# Patient Record
Sex: Female | Born: 1950 | Race: White | Hispanic: No | Marital: Married | Smoking: Never smoker
Health system: Southern US, Community
[De-identification: ages and names within clinical notes are randomized; demographics above are authoritative.]

## PROBLEM LIST (undated history)

## (undated) DIAGNOSIS — R232 Flushing: Secondary | ICD-10-CM

## (undated) DIAGNOSIS — M1990 Unspecified osteoarthritis, unspecified site: Secondary | ICD-10-CM

## (undated) DIAGNOSIS — I480 Paroxysmal atrial fibrillation: Secondary | ICD-10-CM

## (undated) DIAGNOSIS — I4510 Unspecified right bundle-branch block: Secondary | ICD-10-CM

## (undated) DIAGNOSIS — Z8669 Personal history of other diseases of the nervous system and sense organs: Secondary | ICD-10-CM

## (undated) DIAGNOSIS — E785 Hyperlipidemia, unspecified: Secondary | ICD-10-CM

## (undated) DIAGNOSIS — IMO0001 Reserved for inherently not codable concepts without codable children: Secondary | ICD-10-CM

## (undated) DIAGNOSIS — R0600 Dyspnea, unspecified: Secondary | ICD-10-CM

## (undated) DIAGNOSIS — E669 Obesity, unspecified: Secondary | ICD-10-CM

## (undated) DIAGNOSIS — I319 Disease of pericardium, unspecified: Secondary | ICD-10-CM

## (undated) DIAGNOSIS — I10 Essential (primary) hypertension: Secondary | ICD-10-CM

## (undated) DIAGNOSIS — E039 Hypothyroidism, unspecified: Secondary | ICD-10-CM

## (undated) DIAGNOSIS — R011 Cardiac murmur, unspecified: Secondary | ICD-10-CM

## (undated) HISTORY — PX: HAND SURGERY: SHX662

## (undated) HISTORY — DX: Hypothyroidism, unspecified: E03.9

## (undated) HISTORY — DX: Essential (primary) hypertension: I10

## (undated) HISTORY — DX: Unspecified osteoarthritis, unspecified site: M19.90

## (undated) HISTORY — DX: Personal history of other diseases of the nervous system and sense organs: Z86.69

## (undated) HISTORY — PX: DILATION AND CURETTAGE OF UTERUS: SHX78

## (undated) HISTORY — DX: Hyperlipidemia, unspecified: E78.5

## (undated) HISTORY — PX: CARPAL TUNNEL RELEASE: SHX101

## (undated) HISTORY — DX: Flushing: R23.2

## (undated) HISTORY — DX: Reserved for inherently not codable concepts without codable children: IMO0001

## (undated) HISTORY — DX: Obesity, unspecified: E66.9

---

## 1973-05-27 HISTORY — PX: TUBAL LIGATION: SHX77

## 1985-05-27 HISTORY — PX: CHOLECYSTECTOMY: SHX55

## 1998-04-12 ENCOUNTER — Other Ambulatory Visit: Admission: RE | Admit: 1998-04-12 | Discharge: 1998-04-12 | Payer: Self-pay | Admitting: Obstetrics and Gynecology

## 1999-01-18 ENCOUNTER — Other Ambulatory Visit: Admission: RE | Admit: 1999-01-18 | Discharge: 1999-01-18 | Payer: Self-pay | Admitting: Obstetrics and Gynecology

## 2000-03-19 ENCOUNTER — Other Ambulatory Visit: Admission: RE | Admit: 2000-03-19 | Discharge: 2000-03-19 | Payer: Self-pay | Admitting: Obstetrics and Gynecology

## 2001-07-10 ENCOUNTER — Other Ambulatory Visit: Admission: RE | Admit: 2001-07-10 | Discharge: 2001-07-10 | Payer: Self-pay | Admitting: Obstetrics and Gynecology

## 2002-05-27 DIAGNOSIS — IMO0001 Reserved for inherently not codable concepts without codable children: Secondary | ICD-10-CM

## 2002-05-27 HISTORY — DX: Reserved for inherently not codable concepts without codable children: IMO0001

## 2002-08-20 HISTORY — PX: CARDIOVASCULAR STRESS TEST: SHX262

## 2003-01-03 ENCOUNTER — Other Ambulatory Visit: Admission: RE | Admit: 2003-01-03 | Discharge: 2003-01-03 | Payer: Self-pay | Admitting: Obstetrics and Gynecology

## 2004-07-25 HISTORY — PX: US ECHOCARDIOGRAPHY: HXRAD669

## 2004-10-24 ENCOUNTER — Other Ambulatory Visit: Admission: RE | Admit: 2004-10-24 | Discharge: 2004-10-24 | Payer: Self-pay | Admitting: Obstetrics and Gynecology

## 2008-03-23 ENCOUNTER — Emergency Department (HOSPITAL_COMMUNITY): Admission: EM | Admit: 2008-03-23 | Discharge: 2008-03-23 | Payer: Self-pay | Admitting: Emergency Medicine

## 2008-07-13 ENCOUNTER — Encounter: Admission: RE | Admit: 2008-07-13 | Discharge: 2008-07-13 | Payer: Self-pay | Admitting: Family Medicine

## 2008-07-23 ENCOUNTER — Encounter: Admission: RE | Admit: 2008-07-23 | Discharge: 2008-07-23 | Payer: Self-pay | Admitting: Neurosurgery

## 2008-08-09 ENCOUNTER — Ambulatory Visit (HOSPITAL_COMMUNITY): Admission: RE | Admit: 2008-08-09 | Discharge: 2008-08-10 | Payer: Self-pay | Admitting: Neurosurgery

## 2008-08-25 HISTORY — PX: SPINE SURGERY: SHX786

## 2009-10-06 IMAGING — RF DG THORACIC SPINE 2V
1 series · 3 of 3 positions shown · non-contrast
Comparison: None

CLINICAL DATA: Thoracic disc protrusion.

THORACIC SPINE - 2 VIEW

[Series 1: run · 3 of 3 slices shown]
[im 1/3]
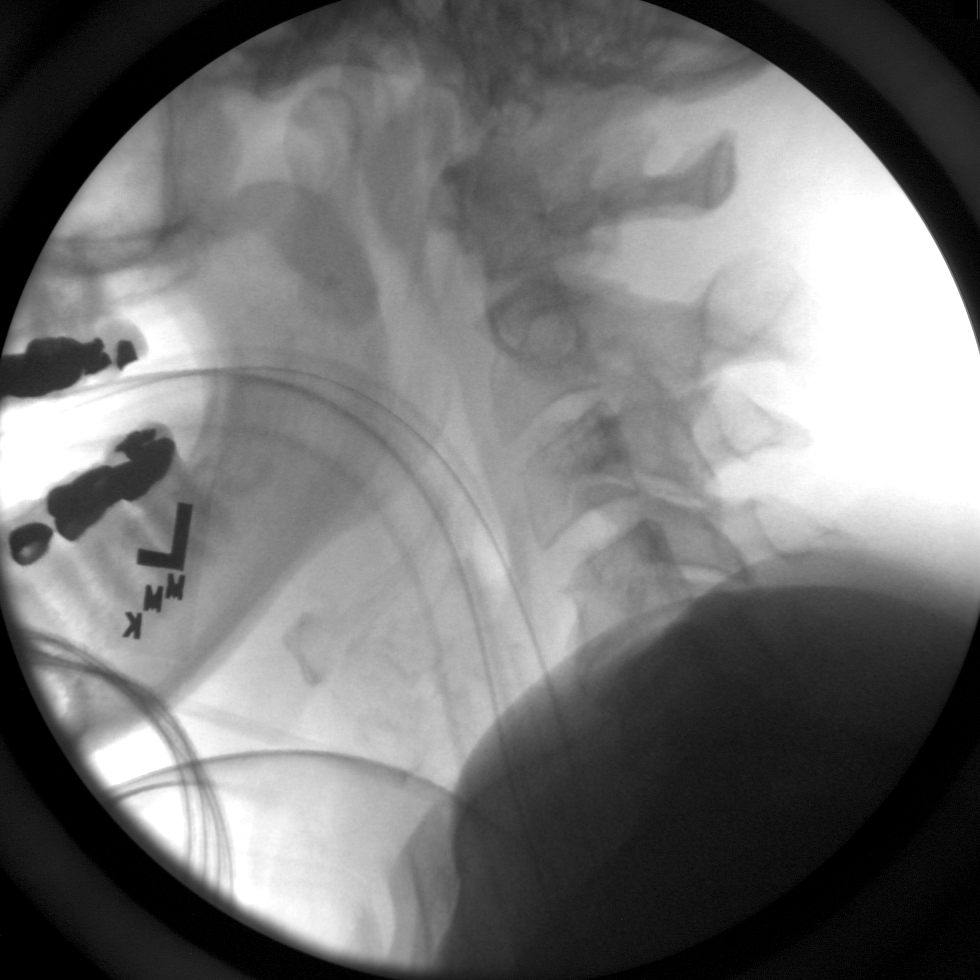
[im 2/3]
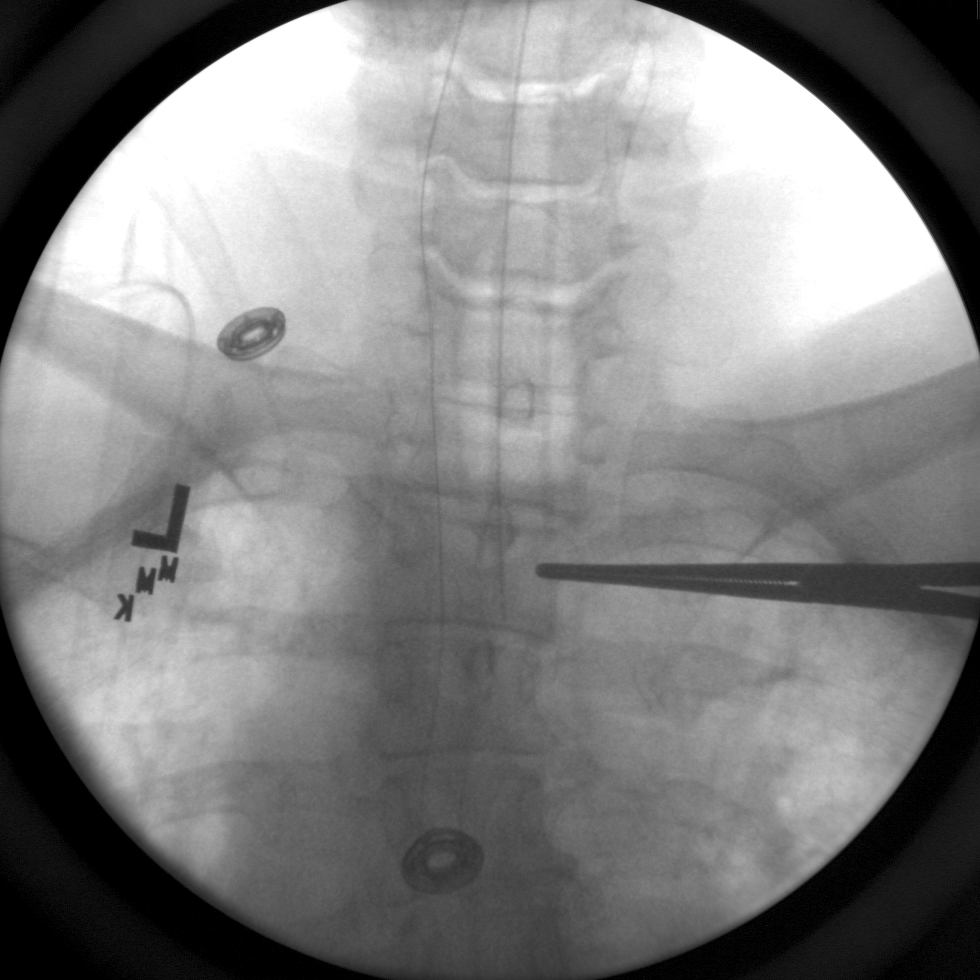
[im 3/3]
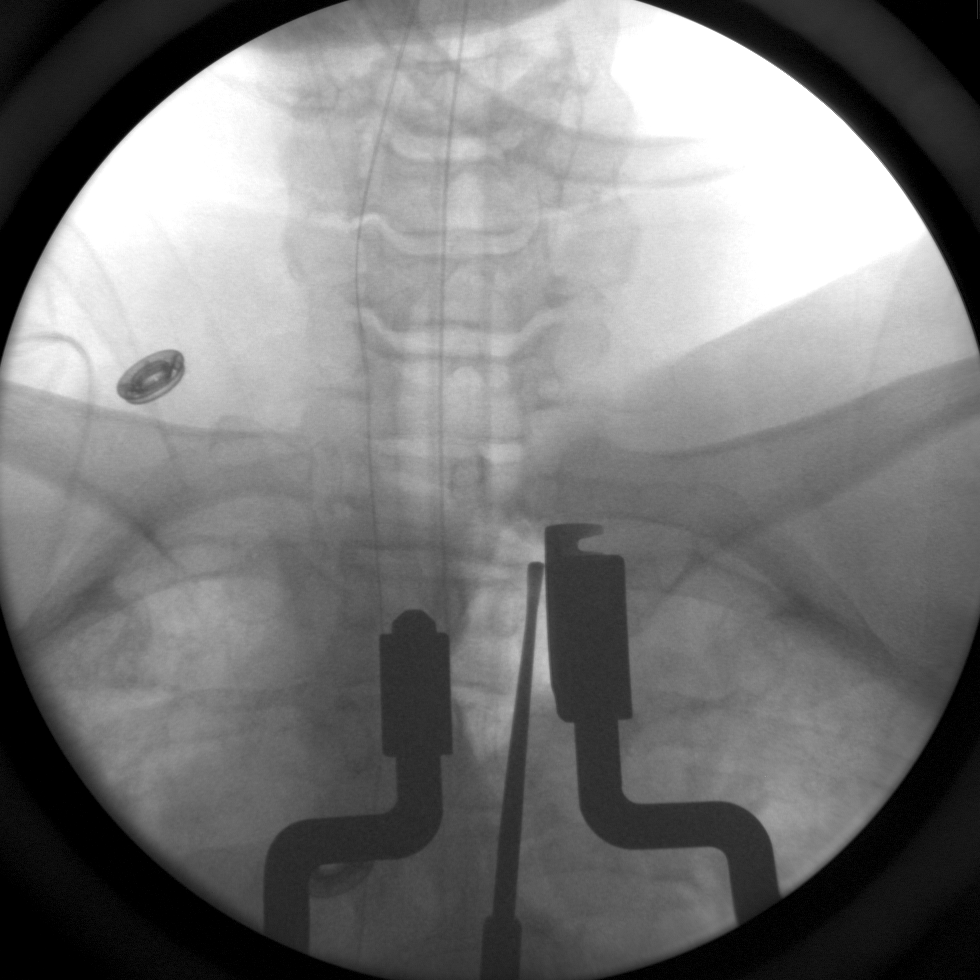

[3 of 3 positions shown; findings below may reference images not displayed]

FINDINGS: Two fluoroscopic spot images demonstrate a surgical
instrument marking the T1-T2 level.
IMPRESSION: T1-T2 marked intraoperatively.

## 2010-01-24 ENCOUNTER — Ambulatory Visit: Payer: Self-pay | Admitting: Cardiology

## 2010-06-17 ENCOUNTER — Encounter: Payer: Self-pay | Admitting: Family Medicine

## 2010-06-17 ENCOUNTER — Encounter: Payer: Self-pay | Admitting: Neurosurgery

## 2010-08-06 ENCOUNTER — Ambulatory Visit (INDEPENDENT_AMBULATORY_CARE_PROVIDER_SITE_OTHER): Payer: PRIVATE HEALTH INSURANCE | Admitting: Cardiology

## 2010-08-06 DIAGNOSIS — I10 Essential (primary) hypertension: Secondary | ICD-10-CM

## 2010-08-06 DIAGNOSIS — E7800 Pure hypercholesterolemia, unspecified: Secondary | ICD-10-CM

## 2010-09-06 LAB — CBC
Hemoglobin: 15.2 g/dL — ABNORMAL HIGH (ref 12.0–15.0)
MCHC: 34.3 g/dL (ref 30.0–36.0)
MCV: 93.3 fL (ref 78.0–100.0)
RBC: 4.75 MIL/uL (ref 3.87–5.11)

## 2010-09-06 LAB — BASIC METABOLIC PANEL
CO2: 27 mEq/L (ref 19–32)
Chloride: 106 mEq/L (ref 96–112)
GFR calc Af Amer: >60 mL/min (ref >60)
Potassium: 4.4 mEq/L (ref 3.5–5.1)
Sodium: 140 mEq/L (ref 135–145)

## 2010-09-06 LAB — URINALYSIS, ROUTINE W REFLEX MICROSCOPIC
Hgb urine dipstick: NEGATIVE
Specific Gravity, Urine: 1.008 (ref 1.005–1.030)
Urobilinogen, UA: 0.2 mg/dL (ref 0.0–1.0)

## 2010-09-06 LAB — PROTIME-INR: INR: 0.9 (ref 0.00–1.49)

## 2010-10-09 NOTE — Op Note (Signed)
NAMEMILDA, LINDVALL                ACCOUNT NO.:  [id number]   MEDICAL RECORD NO.:  [id number]          PATIENT TYPE:  OIB   LOCATION:  3003                         FACILITY:  [facility]   PHYSICIAN:  Clydene Fake, M.D.  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   SURGEON:  Clydene Fake, MD   ASSISTANT:  Hewitt Shorts, M.D.   ANESTHESIA:  General endotracheal tube anesthesia.   ESTIMATED BLOOD LOSS:  Minimal.   BLOOD GIVEN:  None.   DRAINS:  None.   COMPLICATIONS:  None.   REASON FOR PROCEDURE:  The patient is a 60 year old woman who has been  having right-sided neck, shoulder, face, arm pain and numbness, Horner  syndrome.  MRI of the brain was negative.  An MRI of the C-spine shows  multilevel spine change with a very large right-sided disk herniation at  the right-sided T1-2 causing compression, and the patient was brought in  for a diskectomy.   PROCEDURE IN DETAIL:  The patient was brought into the operating room  and general anesthesia was induced.  The patient was placed in [place]  head pins and placed in a prone position with all pressure points  padded.  Fluoroscopy was then brought in, and we tried both the AP and  lateral fluoroscopic images to see if we could see that T1-2 level and  AP images were the best we marked the skin where the T1-2 level would  be.  The patient was then prepped and draped in sterile fashion.  Site  of incision injected with 20 mL of 1% lidocaine with epinephrine.  Incision was then made in the midline of the upper thoracic area.  Incision was taken down to the fascia.  Hemostasis was obtained with  Bovie cauterization.  The fascia was incised and subperiosteal  dissection was done over the T1 and T2 spinous process lamina out to the  facets and the lateral mass.  Self-retaining retractor was placed.  Marker was placed at the T1-2 interspace, and fluoroscopy was used  again, and this confirmed the position in the right-sided T1-2.  Microscope was brought in for microdissection.  High-speed drill was  used to start a decompressive foraminotomy and laminotomy, and this was  completed with Kerrison punches __________ canal and decompressed around  the nerve roots and decompressed over the nerve root.  We then explored  the epidural space, and there was a soft disk fragment underneath the  ligament.  This was opened with nerve hook and free fragments of disk  were removed decompressing the nerve root.  There was some harder  spondylitic area but incising in the disk space, we were then able to  free up further free fragments of disk using a Black hook and micro  pituitaries.  We then  entered the nerve root of the hook and few more  pieces of enlarged free fragments of disk were removed.  When we  finished, we had good decompression of the nerve root and lateral to the  canal.  It looked much better than before starting the diskectomy.  We  had good hemostasis, irrigated with antibiotic solution and placed 40 of  Depo-Medrol over the nerve root and the epidural space.  Retractors  removed.  We had good hemostasis, and the fascia was closed with 0  Vicryl interrupted sutures.  Subcutaneous tissue closed with 0, 2-0, and  3-0 Vicryl interrupted sutures, and skin was closed with benzoin and  Steri-Strips.  Dressing was placed.  The patient was placed back in the  supine position, head pins removed, awoken from anesthesia, and  transferred to recovery room in stable condition.           ______________________________  Clydene Fake, M.D.     JRH/MEDQ  D:  08/09/2008  T:  08/10/2008   Job:  [id number]

## 2011-01-22 ENCOUNTER — Encounter: Payer: Self-pay | Admitting: Nurse Practitioner

## 2011-02-05 ENCOUNTER — Other Ambulatory Visit: Payer: Self-pay | Admitting: Nurse Practitioner

## 2011-02-05 DIAGNOSIS — I10 Essential (primary) hypertension: Secondary | ICD-10-CM

## 2011-02-05 DIAGNOSIS — E785 Hyperlipidemia, unspecified: Secondary | ICD-10-CM

## 2011-02-06 ENCOUNTER — Other Ambulatory Visit (INDEPENDENT_AMBULATORY_CARE_PROVIDER_SITE_OTHER): Payer: BC Managed Care – PPO | Admitting: *Deleted

## 2011-02-06 ENCOUNTER — Encounter: Payer: Self-pay | Admitting: Nurse Practitioner

## 2011-02-06 ENCOUNTER — Ambulatory Visit (INDEPENDENT_AMBULATORY_CARE_PROVIDER_SITE_OTHER): Payer: BC Managed Care – PPO | Admitting: Nurse Practitioner

## 2011-02-06 DIAGNOSIS — I10 Essential (primary) hypertension: Secondary | ICD-10-CM

## 2011-02-06 DIAGNOSIS — R5381 Other malaise: Secondary | ICD-10-CM

## 2011-02-06 DIAGNOSIS — E785 Hyperlipidemia, unspecified: Secondary | ICD-10-CM

## 2011-02-06 DIAGNOSIS — R5383 Other fatigue: Secondary | ICD-10-CM

## 2011-02-06 DIAGNOSIS — E039 Hypothyroidism, unspecified: Secondary | ICD-10-CM

## 2011-02-06 LAB — LIPID PANEL
Cholesterol: 183 mg/dL (ref 0–200)
HDL: 47.8 mg/dL (ref >39.00)
LDL Cholesterol: 109 mg/dL — ABNORMAL HIGH (ref 0–99)
Total CHOL/HDL Ratio: 4
Triglycerides: 133 mg/dL (ref 0.0–149.0)
VLDL: 26.6 mg/dL (ref 0.0–40.0)

## 2011-02-06 LAB — TSH: TSH: 1.97 u[IU]/mL (ref 0.35–5.50)

## 2011-02-06 LAB — BASIC METABOLIC PANEL
BUN: 14 mg/dL (ref 6–23)
CO2: 25 mEq/L (ref 19–32)
Calcium: 9.2 mg/dL (ref 8.4–10.5)
Chloride: 105 mEq/L (ref 96–112)
Creatinine, Ser: 0.8 mg/dL (ref 0.4–1.2)
GFR: 78.58 mL/min (ref >60.00)
Glucose, Bld: 97 mg/dL (ref 70–99)
Potassium: 3.5 mEq/L (ref 3.5–5.1)
Sodium: 141 mEq/L (ref 135–145)

## 2011-02-06 LAB — HEPATIC FUNCTION PANEL
ALT: 43 U/L — ABNORMAL HIGH (ref 0–35)
AST: 38 U/L — ABNORMAL HIGH (ref 0–37)
Albumin: 4.2 g/dL (ref 3.5–5.2)
Alkaline Phosphatase: 62 U/L (ref 39–117)
Bilirubin, Direct: 0.1 mg/dL (ref 0.0–0.3)
Total Bilirubin: 0.7 mg/dL (ref 0.3–1.2)
Total Protein: 7.1 g/dL (ref 6.0–8.3)

## 2011-02-06 NOTE — Progress Notes (Signed)
Sydney Wolfe Date of Birth: 10/08/1950   History of Present Illness: Sydney Wolfe is seen back today for her 6 month visit. She is seen for Dr. Elease Hashimoto. She is a former patient of Dr. Ronnald Nian. She is doing well. No chest pain or shortness of breath. Blood pressure is good at home. She is back walking 2 miles about 4 times a week. She does have some fatigue and is not sure if "it is due to age, bad habits or lifestyle". She does have hypothyroidism and we will check a TSH today. She is tolerating her medicines.    Current Outpatient Prescriptions on File Prior to Visit  Medication Sig Dispense Refill  . allopurinol (ZYLOPRIM) 300 MG tablet Take 300 mg by mouth daily.        [name] aspirin 81 MG tablet Take 81 mg by mouth daily.        . clobetasol (TEMOVATE) 0.05 % cream Apply topically 2 (two) times daily.        . colchicine 0.6 MG tablet Take 0.6 mg by mouth daily.        [name] desloratadine (CLARINEX) 5 MG tablet Take 5 mg by mouth daily.        . mometasone (ELOCON) 0.1 % cream Apply topically 2 (two) times daily.        . naproxen (NAPROSYN) 500 MG tablet Take 500 mg by mouth 2 (two) times daily with a meal.        . potassium chloride SA (K-DUR,KLOR-CON) 20 MEQ tablet Take 20 mEq by mouth 2 (two) times daily.        . rosuvastatin (CRESTOR) 5 MG tablet Take 5 mg by mouth 3 (three) times a week.        . valsartan-hydrochlorothiazide (DIOVAN-HCT) 160-25 MG per tablet Take 1 tablet by mouth daily.        [name] levothyroxine (SYNTHROID, LEVOTHROID) 100 MCG tablet Take 100 mcg by mouth daily.          No Known Allergies  Past Medical History  Diagnosis Date  . Arthritis   . Hypertension   . Hyperlipidemia   . Gout   . Obesity   . Hot flashes   . 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 OA (osteoarthritis)   . Hypothyroidism   . Normal nuclear stress test 2004    Past Surgical History  Procedure Date  . Spine surgery 08/2008    T2  . Cholecystectomy 1987   . US echocardiography 07/25/2004    EF 55-60%  . Cardiovascular stress test 08/20/2002    EF 76%    History  Smoking status  . Never Smoker   Smokeless tobacco  . Not on file    History  Alcohol Use No    Family History  Problem Relation Age of Onset  . Heart failure Mother   . Hypertension Father   . Stroke Father   . Diabetes Father     Review of Systems: The review of systems is positive for occasional gout flareups. No chest pain or shortness of breath.  All other systems were reviewed and are negative.  Physical Exam: BP 118/88  Pulse 80  Ht 5\' 9"  (1.753 m)  Wt 206 lb 6.4 oz (93.622 kg)  BMI 30.48 kg/m2 Patient is very pleasant and in no acute distress. She is overweight. Skin is warm and dry. Color is normal.  HEENT is unremarkable. Normocephalic/atraumatic.  PERRL. Sclera are nonicteric. Neck is supple. No masses. No JVD. Lungs are clear. Cardiac exam shows a regular rate and rhythm. Abdomen is soft. Extremities are without edema. Gait and ROM are intact. No gross neurologic deficits noted.'  LABORATORY DATA: PENDING   Assessment / Plan:

## 2011-02-06 NOTE — Assessment & Plan Note (Signed)
She has some fatigue. I suspect it is more due to bad habits and lifestyle. Exercise is encouraged. TSH is checked today as well.

## 2011-02-06 NOTE — Assessment & Plan Note (Signed)
Labs are checked today.  

## 2011-02-06 NOTE — Patient Instructions (Signed)
Stay on your current medicines We will check your labs and you should get a phone call in just a couple of days with the results We will see you in 6 months. You will see Dr. Delane Ginger Call for any problems. Stay active!

## 2011-02-06 NOTE — Assessment & Plan Note (Signed)
Blood pressure is ok. No change with her medicines. Diet and weight loss are encouraged. Will see her back in 6 months. Patient is agreeable to this plan and will call if any problems develop in the interim.

## 2011-02-07 NOTE — Progress Notes (Signed)
lm

## 2011-02-08 ENCOUNTER — Telehealth: Payer: Self-pay | Admitting: *Deleted

## 2011-02-08 ENCOUNTER — Other Ambulatory Visit: Payer: Self-pay | Admitting: *Deleted

## 2011-02-08 DIAGNOSIS — I10 Essential (primary) hypertension: Secondary | ICD-10-CM

## 2011-02-08 MED ORDER — FUROSEMIDE 20 MG PO TABS: 20.0000 mg | ORAL_TABLET | Freq: Every day | ORAL | Status: DC | PRN

## 2011-02-08 NOTE — Telephone Encounter (Signed)
Notified of lab results; will recheck labs in 6 mo w/Dr. Elease Hashimoto. Will send copy to Dr. Collins Scotland. Also states Dr. Deborah Chalk gave her Rx "a long time ago" for Furosemide 20 mg to take prn for swelling, needs a refill. Ok per [place], will send Rx in to [facility]

## 2011-02-08 NOTE — Telephone Encounter (Signed)
Message copied by Lorayne Bender on Fri Feb 08, 2011 12:17 PM ------      Message from: Rosalio Macadamia      Created: Thu Feb 07, 2011  9:49 AM       Ok to report. Labs are satisfactory.  Stay on same medicines. Recheck BMET/HPF/LIPIDS  in 6 months. Need aggressive diet and weight loss.

## 2011-02-26 LAB — COMPREHENSIVE METABOLIC PANEL
ALT: 33
AST: 26
Calcium: 8.6
Creatinine, Ser: 0.62
GFR calc Af Amer: >60
Sodium: 142
Total Protein: 6.7

## 2011-02-26 LAB — DIFFERENTIAL
Eosinophils Absolute: 0.1
Eosinophils Relative: 1
Lymphocytes Relative: 25
Lymphs Abs: 2.9
Monocytes Relative: 7
Neutrophils Relative %: 68

## 2011-02-26 LAB — CBC
MCHC: 33.6
RDW: 13.5

## 2011-02-26 LAB — URINALYSIS, ROUTINE W REFLEX MICROSCOPIC
Nitrite: NEGATIVE
Protein, ur: NEGATIVE
Urobilinogen, UA: 0.2

## 2011-08-20 ENCOUNTER — Other Ambulatory Visit (INDEPENDENT_AMBULATORY_CARE_PROVIDER_SITE_OTHER): Payer: BC Managed Care – PPO

## 2011-08-20 DIAGNOSIS — I10 Essential (primary) hypertension: Secondary | ICD-10-CM

## 2011-08-20 LAB — LIPID PANEL
Cholesterol: 174 mg/dL (ref 0–200)
HDL: 49.9 mg/dL (ref >39.00)
LDL Cholesterol: 98 mg/dL (ref 0–99)
Total CHOL/HDL Ratio: 3
Triglycerides: 131 mg/dL (ref 0.0–149.0)
VLDL: 26.2 mg/dL (ref 0.0–40.0)

## 2011-08-20 LAB — BASIC METABOLIC PANEL
BUN: 12 mg/dL (ref 6–23)
CO2: 25 mEq/L (ref 19–32)
Calcium: 8.9 mg/dL (ref 8.4–10.5)
Chloride: 104 mEq/L (ref 96–112)
Creatinine, Ser: 0.8 mg/dL (ref 0.4–1.2)
GFR: 79.6 mL/min (ref >60.00)
Glucose, Bld: 89 mg/dL (ref 70–99)
Potassium: 3.7 mEq/L (ref 3.5–5.1)
Sodium: 138 mEq/L (ref 135–145)

## 2011-08-20 LAB — HEPATIC FUNCTION PANEL
ALT: 33 U/L (ref 0–35)
AST: 31 U/L (ref 0–37)
Albumin: 3.8 g/dL (ref 3.5–5.2)
Alkaline Phosphatase: 57 U/L (ref 39–117)
Bilirubin, Direct: 0.1 mg/dL (ref 0.0–0.3)
Total Bilirubin: 0.6 mg/dL (ref 0.3–1.2)
Total Protein: 7.1 g/dL (ref 6.0–8.3)

## 2011-08-27 ENCOUNTER — Ambulatory Visit (INDEPENDENT_AMBULATORY_CARE_PROVIDER_SITE_OTHER): Payer: BC Managed Care – PPO | Admitting: Cardiovascular Disease

## 2011-08-27 ENCOUNTER — Encounter: Payer: Self-pay | Admitting: Cardiovascular Disease

## 2011-08-27 VITALS — BP 133/89 | HR 74 | Ht 70.0 in | Wt 215.8 lb

## 2011-08-27 DIAGNOSIS — E785 Hyperlipidemia, unspecified: Secondary | ICD-10-CM

## 2011-08-27 DIAGNOSIS — I10 Essential (primary) hypertension: Secondary | ICD-10-CM

## 2011-08-27 MED ORDER — FUROSEMIDE 20 MG PO TABS: 20.0000 mg | ORAL_TABLET | Freq: Every day | ORAL | Status: DC | PRN

## 2011-08-27 MED ORDER — ROSUVASTATIN CALCIUM 5 MG PO TABS: 5.0000 mg | ORAL_TABLET | ORAL | Status: DC | PRN

## 2011-08-27 NOTE — Assessment & Plan Note (Signed)
[name] is doing fairly well. Blood pressure is a little bit elevated. I encouraged her to continue with a good diet and exercise and weight loss program. Her potassium was 3.7. It turns out that she has been taking her potassium only 20 mEq once a day instead of the twice a day dose that we had previously ordered. I encouraged to take her potassium twice a day which will also help with her blood pressure. She'll try to walk everyday. I'll see her again in 6 months. Will check fasting labs at that time.

## 2011-08-27 NOTE — Patient Instructions (Signed)
Your physician wants you to follow-up in: 6 MONTHS You will receive a reminder letter in the mail two months in advance. If you don't receive a letter, please call our office to schedule the follow-up appointment.  Your physician recommends that you return for a FASTING lipid profile: 6 MONTHS    

## 2011-08-27 NOTE — Progress Notes (Signed)
[name] Date of Birth  Sep 07, 1950 [facility]     [place] [facility]  [address]    [address]   [address] [place], [place]  [address]    [place], [place]  [address] [phone]  Fax  [phone]  [phone]  Fax [phone]  Problems: 1. Hypertension 2. Hyperlipidemia 3. Mild obesity.  History of Present Illness:  Elita Quick is a 61 y.o. female with the above noted hx.  She has done very well. She has not had any episodes of chest pain or shortness breath. She has been exercising on regular basis  Current Outpatient Prescriptions on File Prior to Visit  Medication Sig Dispense Refill  . allopurinol (ZYLOPRIM) 300 MG tablet Take 300 mg by mouth daily.        [name] aspirin 81 MG tablet Take 81 mg by mouth daily.        . clobetasol (TEMOVATE) 0.05 % cream Apply topically 2 (two) times daily.        . colchicine 0.6 MG tablet Take 0.6 mg by mouth daily.        . furosemide (LASIX) 20 MG tablet Take 1 tablet (20 mg total) by mouth daily as needed.  30 tablet  2  . levothyroxine (SYNTHROID, LEVOTHROID) 100 MCG tablet Take 100 mcg by mouth daily.        . mometasone (ELOCON) 0.1 % cream Apply topically 2 (two) times daily.        . potassium chloride SA (K-DUR,KLOR-CON) 20 MEQ tablet Take 20 mEq by mouth 2 (two) times daily.        . rosuvastatin (CRESTOR) 5 MG tablet Take 5 mg by mouth as needed.       . valsartan-hydrochlorothiazide (DIOVAN-HCT) 160-25 MG per tablet Take 1 tablet by mouth daily.          No Known Allergies  Past Medical History  Diagnosis Date  . Arthritis   . Hypertension   . Hyperlipidemia   . Gout   . Obesity   . Hot flashes   . 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 OA (osteoarthritis)   . Hypothyroidism   . Normal nuclear stress test 2004    Past Surgical History  Procedure Date  . Spine surgery 08/2008    T2  . Cholecystectomy 1987  . US echocardiography 07/25/2004    EF 55-60%  .  Cardiovascular stress test 08/20/2002    EF 76%    History  Smoking status  . Never Smoker   Smokeless tobacco  . Not on file    History  Alcohol Use No    Family History  Problem Relation Age of Onset  . Heart failure Mother   . Hypertension Father   . Stroke Father   . Diabetes Father     Reviw of Systems:  Reviewed in the HPI.  All other systems are negative.  Physical Exam: Blood pressure 133/89, pulse 74, height 5\' 10"  (1.778 m), weight 215 lb 12.8 oz (97.886 kg). General: Well developed, well nourished, in no acute distress.  Head: Normocephalic, atraumatic, sclera non-icteric, mucus membranes are moist,   Neck: Supple. Carotids are 2 + without bruits. No JVD  Lungs: Clear bilaterally to auscultation.  Heart: regular rate.  normal  S1 S2. No murmurs, gallops or rubs.  Abdomen: Soft, non-tender, non-distended with normal bowel sounds. No hepatomegaly. No rebound/guarding. No masses.  Msk:  Strength and tone are normal  Extremities: No clubbing or cyanosis. No edema.  Distal pedal pulses are 2+ and equal bilaterally.  Neuro: Alert and oriented X 3. Moves all extremities spontaneously.  Psych:  Responds to questions appropriately with a normal affect.  ECG: August 27, 2011. [employer] [name]. NS ST/T abn. Rate of 77.  Assessment / Plan:

## 2011-09-03 ENCOUNTER — Other Ambulatory Visit: Payer: Self-pay | Admitting: Cardiology

## 2011-09-06 ENCOUNTER — Telehealth: Payer: Self-pay

## 2011-09-09 MED ORDER — POTASSIUM CHLORIDE CRYS ER 20 MEQ PO TBCR: 20.0000 meq | EXTENDED_RELEASE_TABLET | Freq: Two times a day (BID) | ORAL | Status: DC

## 2011-09-09 NOTE — Telephone Encounter (Signed)
uio

## 2012-04-08 ENCOUNTER — Encounter (INDEPENDENT_AMBULATORY_CARE_PROVIDER_SITE_OTHER): Payer: Self-pay

## 2012-04-13 ENCOUNTER — Encounter (INDEPENDENT_AMBULATORY_CARE_PROVIDER_SITE_OTHER): Payer: Self-pay | Admitting: General Surgery

## 2012-04-13 ENCOUNTER — Other Ambulatory Visit (INDEPENDENT_AMBULATORY_CARE_PROVIDER_SITE_OTHER): Payer: Self-pay | Admitting: General Surgery

## 2012-04-13 ENCOUNTER — Ambulatory Visit (INDEPENDENT_AMBULATORY_CARE_PROVIDER_SITE_OTHER): Payer: BC Managed Care – PPO | Admitting: General Surgery

## 2012-04-13 VITALS — BP 120/82 | HR 102 | Temp 97.6°F | Ht 70.0 in | Wt 208.8 lb

## 2012-04-13 DIAGNOSIS — R590 Localized enlarged lymph nodes: Secondary | ICD-10-CM

## 2012-04-13 DIAGNOSIS — R599 Enlarged lymph nodes, unspecified: Secondary | ICD-10-CM

## 2012-04-13 DIAGNOSIS — R591 Generalized enlarged lymph nodes: Secondary | ICD-10-CM | POA: Insufficient documentation

## 2012-04-13 NOTE — Progress Notes (Signed)
Patient ID: Sydney Wolfe, female   DOB: 04-Jun-1950, 61 y.o.   MRN: [id number]  Chief Complaint  Patient presents with  . Pre-op Exam    eval Lt axillary lump    HPI Sydney Wolfe is a 61 y.o. female.   HPI  She is referred by Dr. Collins Scotland for evaluation of left axillary lymphadenopathy.  She noted asymmetry between the left axilla and the right axilla 2 months ago. A palpable fullness was noted as well on the clinical exam. Mammogram did not demonstrate any breast lesions. Ultrasound demonstrated a 3.9 cm lymph node with other smaller lymph nodes around that area. She denies any trauma to the area. She has had lichen sclerosis in the region before. No family history of breast cancer. No family history of lymphoma.  Past Medical History  Diagnosis Date  . Arthritis   . Hypertension   . Hyperlipidemia   . Gout   . Obesity   . Hot flashes   . 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 OA (osteoarthritis)   . Hypothyroidism   . Normal nuclear stress test 2004    Past Surgical History  Procedure Date  . Spine surgery 08/2008    T2  . Cholecystectomy 1987  . US echocardiography 07/25/2004    EF 55-60%  . Cardiovascular stress test 08/20/2002    EF 76%  . Tubal ligation 1975  . Hand surgery 1987 and 1988    bilateral  . Dilation and curettage of uterus multiple    Family History  Problem Relation Age of Onset  . Heart failure Mother   . Hypertension Father   . Stroke Father   . Diabetes Father     Social History History  Substance Use Topics  . Smoking status: Never Smoker   . Smokeless tobacco: Not on file  . Alcohol Use: No    No Known Allergies  Current Outpatient Prescriptions  Medication Sig Dispense Refill  . allopurinol (ZYLOPRIM) 300 MG tablet Take 300 mg by mouth daily.        [name] aspirin 81 MG tablet Take 81 mg by mouth daily.        . B Complex-Biotin-FA (B-COMPLEX PO) Take by mouth daily.      . clobetasol (TEMOVATE) 0.05 %  cream Apply topically 2 (two) times daily.        . colchicine 0.6 MG tablet Take 0.6 mg by mouth daily.        [name] desloratadine (CLARINEX) 5 MG tablet Take 5 mg by mouth daily.      . furosemide (LASIX) 20 MG tablet Take 1 tablet (20 mg total) by mouth daily as needed.  30 tablet  2  . Lancets MISC by Does not apply route. Use with Freestyle Lite glucometer      . levothyroxine (SYNTHROID, LEVOTHROID) 100 MCG tablet Take 100 mcg by mouth daily.        . meloxicam (MOBIC) 7.5 MG tablet Take 7.5 mg by mouth 2 (two) times daily.      . mometasone (ELOCON) 0.1 % cream Apply topically 2 (two) times daily.        [name] nystatin ointment (MYCOSTATIN) Apply topically as needed.      . potassium chloride SA (K-DUR,KLOR-CON) 20 MEQ tablet Take 1 tablet (20 mEq total) by mouth 2 (two) times daily.  60 tablet  7  . rosuvastatin (CRESTOR) 5 MG tablet Take 1 tablet (  5 mg total) by mouth as needed.  30 tablet  5  . valsartan-hydrochlorothiazide (DIOVAN-HCT) 160-25 MG per tablet TAKE 1&1/2 TABLET DAILY  45 tablet  7    Review of Systems Review of Systems  Constitutional: Negative.   Respiratory: Negative.   Cardiovascular: Negative.   Gastrointestinal: Negative.   Hematological: Negative.     Blood pressure 120/82, pulse 102, temperature 97.6 F (36.4 C), temperature source Temporal, height 5\' 10"  (1.778 m), weight 208 lb 12.8 oz (94.711 kg), SpO2 96.00%.  Physical Exam Physical Exam  Constitutional:       Overweight female in NAD.  HENT:  Head: Normocephalic and atraumatic.  Neck: Neck supple.  Pulmonary/Chest:       Breasts-no suspicious skin changes or dominant mass present in either breast the  Musculoskeletal:       In the medial aspect of the left axilla is a firm mass. No right axillary adenopathy. No left arm swelling.  Lymphadenopathy:    She has no cervical adenopathy.    Data Reviewed Mammogram and ultrasound. Note from Dr. Collins Scotland  Assessment    Left axillary adenopathy of unknown  etiology. I think further investigation is warranted.    Plan    Image guided large core needle biopsy of left axillary adenopathy. Further evaluation and treatment pending these results. Will arrange for this.       Adalay Azucena J 04/13/2012, 4:29 PM

## 2012-04-13 NOTE — Patient Instructions (Signed)
Call us if you have not heard the results by one week after the biopsy.

## 2012-04-21 ENCOUNTER — Telehealth (INDEPENDENT_AMBULATORY_CARE_PROVIDER_SITE_OTHER): Payer: Self-pay

## 2012-04-21 ENCOUNTER — Encounter (INDEPENDENT_AMBULATORY_CARE_PROVIDER_SITE_OTHER): Payer: Self-pay | Admitting: General Surgery

## 2012-04-21 ENCOUNTER — Other Ambulatory Visit (INDEPENDENT_AMBULATORY_CARE_PROVIDER_SITE_OTHER): Payer: Self-pay | Admitting: General Surgery

## 2012-04-21 ENCOUNTER — Ambulatory Visit
Admission: RE | Admit: 2012-04-21 | Discharge: 2012-04-21 | Disposition: A | Discharge status: Home / Self Care | Payer: BC Managed Care – PPO | Source: Ambulatory Visit | Attending: General Surgery | Admitting: General Surgery

## 2012-04-21 DIAGNOSIS — R2230 Localized swelling, mass and lump, unspecified upper limb: Secondary | ICD-10-CM

## 2012-04-21 MED ORDER — IOHEXOL 300 MG/ML  SOLN
75.0000 mL | Freq: Once | INTRAMUSCULAR | Status: AC | PRN
  Administered 2012-04-21: 75 mL via INTRAVENOUS

## 2012-04-21 NOTE — Progress Notes (Unsigned)
Patient ID: Sydney Wolfe, female   DOB: [birth year], 61 y.o.   MRN: [id number] [place] did not show any concerning left axillary adenopathy or any mass to biopsy.  Thus, a CT of the area was done which demonstrated that the area of concern was benign fatty tissue.  Thus, I do not feel anything else needs to be done at this time.  This was discussed with her and she seems to understand.

## 2012-04-21 NOTE — Telephone Encounter (Signed)
Pt made aware of appt for CT of chest at [facility] 2:45pm today.  Will call her with results.

## 2012-04-21 NOTE — Telephone Encounter (Signed)
Discuss possible f/u of axillary [place] at [facility] with a chest CT with Dr. Abbey Chatters.  I will make patient aware before the holiday.

## 2012-04-22 ENCOUNTER — Telehealth (INDEPENDENT_AMBULATORY_CARE_PROVIDER_SITE_OTHER): Payer: Self-pay

## 2012-04-22 NOTE — Telephone Encounter (Signed)
Called pt with her CT results.  She was extremely grateful to be able to speak with Dr. Abbey Chatters yesterday.

## 2012-07-11 ENCOUNTER — Other Ambulatory Visit: Payer: Self-pay

## 2012-08-18 ENCOUNTER — Encounter (INDEPENDENT_AMBULATORY_CARE_PROVIDER_SITE_OTHER): Payer: Self-pay

## 2012-08-21 ENCOUNTER — Encounter: Payer: Self-pay | Admitting: Cardiovascular Disease

## 2012-08-21 ENCOUNTER — Ambulatory Visit (INDEPENDENT_AMBULATORY_CARE_PROVIDER_SITE_OTHER): Payer: BC Managed Care – PPO | Admitting: Cardiovascular Disease

## 2012-08-21 VITALS — BP 128/88 | HR 89 | Ht 70.0 in | Wt 207.0 lb

## 2012-08-21 DIAGNOSIS — E785 Hyperlipidemia, unspecified: Secondary | ICD-10-CM

## 2012-08-21 DIAGNOSIS — I10 Essential (primary) hypertension: Secondary | ICD-10-CM

## 2012-08-21 MED ORDER — VALSARTAN-HYDROCHLOROTHIAZIDE 160-25 MG PO TABS: 1.5000 | ORAL_TABLET | Freq: Every day | ORAL | Status: DC

## 2012-08-21 MED ORDER — CARVEDILOL 6.25 MG PO TABS: 6.2500 mg | ORAL_TABLET | Freq: Two times a day (BID) | ORAL | Status: DC

## 2012-08-21 MED ORDER — ROSUVASTATIN CALCIUM 5 MG PO TABS: 5.0000 mg | ORAL_TABLET | ORAL | Status: DC | PRN

## 2012-08-21 NOTE — Assessment & Plan Note (Addendum)
[name]'s  diastolic blood pressure is mildly elevated today. In addition, her heart rate is a little fast. We'll start her on carvedilol 6.25 mg twice a day. I'll see her again in 3 months for followup visit.  The basic metabolic profile at that time.  We'll refill her valsartan/HCTZ.

## 2012-08-21 NOTE — Patient Instructions (Addendum)
Your physician wants you to follow-up in: 3 months  You will receive a reminder letter in the mail two months in advance. If you don't receive a letter, please call our office to schedule the follow-up appointment.  Your physician recommends that you return for a FASTING lipid profile: next week DRINK WATER  Your physician has recommended you make the following change in your medication:   Start coreg/ carvedilol 6.25 mg twice daily 12 hours apart used for BP and heart rate control.

## 2012-08-21 NOTE — Progress Notes (Signed)
Sydney Wolfe Date of Birth  1950/07/16 [facility]     [facility] Office  [address]    [address]   [address] [place], [place]  [address]    [place], [place]  [address] [phone]  Fax  [phone]  [phone]  Fax [phone]  Problems: 1. Hypertension 2. Hyperlipidemia 3. Mild obesity.  History of Present Illness:  Sydney Wolfe is a 62 y.o. female with the above noted hx.  She has done very well. She has not had any episodes of chest pain or shortness breath. She has been exercising on regular basis  Current Outpatient Prescriptions on File Prior to Visit  Medication Sig Dispense Refill  . allopurinol (ZYLOPRIM) 300 MG tablet Take 300 mg by mouth daily.        [name] aspirin 81 MG tablet Take 81 mg by mouth daily.        . B Complex-Biotin-FA (B-COMPLEX PO) Take by mouth daily.      . clobetasol (TEMOVATE) 0.05 % cream Apply topically 2 (two) times daily.        . colchicine 0.6 MG tablet Take 0.6 mg by mouth daily.        [name] desloratadine (CLARINEX) 5 MG tablet Take 5 mg by mouth daily.      . furosemide (LASIX) 20 MG tablet Take 1 tablet (20 mg total) by mouth daily as needed.  30 tablet  2  . Lancets MISC by Does not apply route. Use with Freestyle Lite glucometer      . levothyroxine (SYNTHROID, LEVOTHROID) 100 MCG tablet Take 100 mcg by mouth daily.        . meloxicam (MOBIC) 7.5 MG tablet Take 7.5 mg by mouth 2 (two) times daily.      . mometasone (ELOCON) 0.1 % cream Apply topically 2 (two) times daily.        [name] nystatin ointment (MYCOSTATIN) Apply topically as needed.      . potassium chloride SA (K-DUR,KLOR-CON) 20 MEQ tablet Take 1 tablet (20 mEq total) by mouth 2 (two) times daily.  60 tablet  7  . rosuvastatin (CRESTOR) 5 MG tablet Take 1 tablet (5 mg total) by mouth as needed.  30 tablet  5  . valsartan-hydrochlorothiazide (DIOVAN-HCT) 160-25 MG per tablet TAKE 1&1/2 TABLET DAILY  45 tablet  7   No current facility-administered medications on file  prior to visit.    No Known Allergies  Past Medical History  Diagnosis Date  . Arthritis   . Hypertension   . Hyperlipidemia   . Gout   . Obesity   . Hot flashes   . 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 OA (osteoarthritis)   . Hypothyroidism   . Normal nuclear stress test 2004    Past Surgical History  Procedure Laterality Date  . Spine surgery  08/2008    T2  . Cholecystectomy  1987  . US echocardiography  07/25/2004    EF 55-60%  . Cardiovascular stress test  08/20/2002    EF 76%  . Tubal ligation  1975  . Hand surgery  1987 and 1988    bilateral  . Dilation and curettage of uterus  multiple    History  Smoking status  . Never Smoker   Smokeless tobacco  . Not on file    History  Alcohol Use No    Family History  Problem Relation Age of Onset  .  Heart failure Mother   . Hypertension Father   . Stroke Father   . Diabetes Father     Reviw of Systems:  Reviewed in the HPI.  All other systems are negative.  Physical Exam: Blood pressure 128/88, pulse 89, height 5\' 10"  (1.778 m), weight 207 lb (93.895 kg), SpO2 95.00%. General: Well developed, well nourished, in no acute distress.  Head: Normocephalic, atraumatic, sclera non-icteric, mucus membranes are moist,   Neck: Supple. Carotids are 2 + without bruits. No JVD  Lungs: Clear bilaterally to auscultation.  Heart: regular rate.  normal  S1 S2. No murmurs, gallops or rubs.  Abdomen: Soft, non-tender, non-distended with normal bowel sounds. No hepatomegaly. No rebound/guarding. No masses.  Msk:  Strength and tone are normal  Extremities: No clubbing or cyanosis. No edema.  Distal pedal pulses are 2+ and equal bilaterally.  Neuro: Alert and oriented X 3. Moves all extremities spontaneously.  Psych:  Responds to questions appropriately with a normal affect.  ECG: August 27, 2011. [employer] [name]. NS ST/T abn. Rate of 77.  Assessment / Plan:

## 2012-08-21 NOTE — Assessment & Plan Note (Signed)
Meri will continue on her Crestor. We'll get fasting lipids, hepatic profile, and basic metabolic profile early next week.

## 2012-08-24 ENCOUNTER — Encounter: Payer: Self-pay | Admitting: Cardiovascular Disease

## 2012-08-25 ENCOUNTER — Other Ambulatory Visit (INDEPENDENT_AMBULATORY_CARE_PROVIDER_SITE_OTHER): Payer: BC Managed Care – PPO

## 2012-08-25 DIAGNOSIS — I10 Essential (primary) hypertension: Secondary | ICD-10-CM

## 2012-08-25 DIAGNOSIS — E785 Hyperlipidemia, unspecified: Secondary | ICD-10-CM

## 2012-08-25 LAB — HEPATIC FUNCTION PANEL
AST: 32 U/L (ref 0–37)
Albumin: 4.1 g/dL (ref 3.5–5.2)
Alkaline Phosphatase: 54 U/L (ref 39–117)
Total Protein: 7.4 g/dL (ref 6.0–8.3)

## 2012-08-25 LAB — LIPID PANEL
Cholesterol: 163 mg/dL (ref 0–200)
Triglycerides: 151 mg/dL — ABNORMAL HIGH (ref 0.0–149.0)

## 2012-08-25 LAB — BASIC METABOLIC PANEL
Calcium: 9.2 mg/dL (ref 8.4–10.5)
Chloride: 100 mEq/L (ref 96–112)
Creatinine, Ser: 0.8 mg/dL (ref 0.4–1.2)
Sodium: 139 mEq/L (ref 135–145)

## 2012-10-12 ENCOUNTER — Encounter: Payer: Self-pay | Admitting: Cardiovascular Disease

## 2012-11-17 ENCOUNTER — Other Ambulatory Visit (INDEPENDENT_AMBULATORY_CARE_PROVIDER_SITE_OTHER): Payer: BC Managed Care – PPO

## 2012-11-17 ENCOUNTER — Ambulatory Visit (INDEPENDENT_AMBULATORY_CARE_PROVIDER_SITE_OTHER): Payer: BC Managed Care – PPO | Admitting: Cardiovascular Disease

## 2012-11-17 ENCOUNTER — Encounter: Payer: Self-pay | Admitting: Cardiovascular Disease

## 2012-11-17 VITALS — BP 110/72 | HR 74 | Ht 70.0 in | Wt 208.2 lb

## 2012-11-17 DIAGNOSIS — I10 Essential (primary) hypertension: Secondary | ICD-10-CM

## 2012-11-17 DIAGNOSIS — E785 Hyperlipidemia, unspecified: Secondary | ICD-10-CM

## 2012-11-17 LAB — BASIC METABOLIC PANEL
Calcium: 9.1 mg/dL (ref 8.4–10.5)
Creatinine, Ser: 0.8 mg/dL (ref 0.4–1.2)
GFR: 79.28 mL/min (ref >60.00)

## 2012-11-17 MED ORDER — METOPROLOL TARTRATE 25 MG PO TABS: ORAL_TABLET | ORAL | Status: DC

## 2012-11-17 NOTE — Assessment & Plan Note (Signed)
[name] is doing well.  She is having some photosensitivity - possibly due to the coreg and plaquenil.  Will DC coreg and try metoprolol 12. 5 BID.  Will check labs today.  I will see her in 6 months ov OV and BMP.

## 2012-11-17 NOTE — Progress Notes (Signed)
Sydney Wolfe Date of Birth  [birth year] [facility]     [place] [facility]  [address]    [address]   [address] [place], [place]  [address]    [place], [place]  [address] [phone]  Fax  [phone]  [phone]  Fax [phone]  Problems: 1. Hypertension 2. Hyperlipidemia 3. Mild obesity.  History of Present Illness:  Sydney Wolfe is a 62 y.o. female with the above noted hx.  She has done very well. She has not had any episodes of chest pain or shortness breath. She has been exercising on regular basis.  November 17, 2012:  She is tolerating the coreg well but has noticed some photosensitivity ( listed by epocrates also as a side effect)  Current Outpatient Prescriptions on File Prior to Visit  Medication Sig Dispense Refill  . allopurinol (ZYLOPRIM) 300 MG tablet Take 300 mg by mouth daily.        [name] aspirin 81 MG tablet Take 81 mg by mouth daily.        . B Complex-Biotin-FA (B-COMPLEX PO) Take by mouth daily.      . carvedilol (COREG) 6.25 MG tablet Take 1 tablet (6.25 mg total) by mouth 2 (two) times daily.  60 tablet  5  . clobetasol (TEMOVATE) 0.05 % cream Apply topically 2 (two) times daily.        . colchicine 0.6 MG tablet Take 0.6 mg by mouth daily.        [name] desloratadine (CLARINEX) 5 MG tablet Take 5 mg by mouth daily.      . furosemide (LASIX) 20 MG tablet Take 1 tablet (20 mg total) by mouth daily as needed.  30 tablet  2  . hydroxychloroquine (PLAQUENIL) 200 MG tablet Take 400 mg by mouth daily.      . Lancets MISC by Does not apply route. Use with Freestyle Lite glucometer      . levothyroxine (SYNTHROID, LEVOTHROID) 100 MCG tablet Take 100 mcg by mouth daily.        . meloxicam (MOBIC) 7.5 MG tablet Take 7.5 mg by mouth 2 (two) times daily.      . mometasone (ELOCON) 0.1 % cream Apply topically 2 (two) times daily.        [name] nystatin ointment (MYCOSTATIN) Apply topically as needed.      . potassium chloride SA (K-DUR,KLOR-CON) 20 MEQ tablet Take 1  tablet (20 mEq total) by mouth 2 (two) times daily.  60 tablet  7  . rosuvastatin (CRESTOR) 5 MG tablet Take 1 tablet (5 mg total) by mouth as needed.  30 tablet  11  . valsartan-hydrochlorothiazide (DIOVAN-HCT) 160-25 MG per tablet Take 1.5 tablets by mouth daily.  45 tablet  11   No current facility-administered medications on file prior to visit.    No Known Allergies  Past Medical History  Diagnosis Date  . Arthritis   . Hypertension   . Hyperlipidemia   . Gout   . Obesity   . Hot flashes   . 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 OA (osteoarthritis)   . Hypothyroidism   . Normal nuclear stress test 2004    Past Surgical History  Procedure Laterality Date  . Spine surgery  08/2008    T2  . Cholecystectomy  1987  . US echocardiography  07/25/2004    EF 55-60%  . Cardiovascular stress test  08/20/2002  EF 76%  . Tubal ligation  1975  . Hand surgery  1987 and 1988    bilateral  . Dilation and curettage of uterus  multiple    History  Smoking status  . Never Smoker   Smokeless tobacco  . Not on file    History  Alcohol Use No    Family History  Problem Relation Age of Onset  . Heart failure Mother   . Hypertension Father   . Stroke Father   . Diabetes Father     Reviw of Systems:  Reviewed in the HPI.  All other systems are negative.  Physical Exam: Blood pressure 110/72, pulse 74, height 5\' 10"  (1.778 m), weight 208 lb 4 oz (94.462 kg). General: Well developed, well nourished, in no acute distress.  Head: Normocephalic, atraumatic, sclera non-icteric, mucus membranes are moist,   Neck: Supple. Carotids are 2 + without bruits. No JVD  Lungs: Clear bilaterally to auscultation.  Heart: regular rate.  normal  S1 S2. No murmurs, gallops or rubs.  Abdomen: Soft, non-tender, non-distended with normal bowel sounds. No hepatomegaly. No rebound/guarding. No masses.  Msk:  Strength and tone are normal  Extremities:  No clubbing or cyanosis. No edema.  Distal pedal pulses are 2+ and equal bilaterally.  Neuro: Alert and oriented X 3. Moves all extremities spontaneously.  Psych:  Responds to questions appropriately with a normal affect.  ECG: August 27, 2011. [employer] [name]. NS ST/T abn. Rate of 77.  Assessment / Plan:

## 2012-11-17 NOTE — Patient Instructions (Addendum)
Start Metoprolol 12.5 mg twice a day   Stop Coreg   Your physician wants you to follow-up in: 6 months with lab BMET You will receive a reminder letter in the mail two months in advance. If you don't receive a letter, please call our office to schedule the follow-up appointment.

## 2013-03-24 ENCOUNTER — Emergency Department (HOSPITAL_COMMUNITY): Payer: BC Managed Care – PPO

## 2013-03-24 ENCOUNTER — Encounter (HOSPITAL_COMMUNITY): Payer: Self-pay | Admitting: Emergency Medicine

## 2013-03-24 ENCOUNTER — Observation Stay (HOSPITAL_COMMUNITY)
Admission: EM | Admit: 2013-03-24 | Discharge: 2013-03-26 | Disposition: A | Discharge status: Home / Self Care | Payer: BC Managed Care – PPO | Attending: Cardiovascular Disease | Admitting: Cardiovascular Disease

## 2013-03-24 DIAGNOSIS — R0789 Other chest pain: Principal | ICD-10-CM | POA: Diagnosis present

## 2013-03-24 DIAGNOSIS — E669 Obesity, unspecified: Secondary | ICD-10-CM | POA: Insufficient documentation

## 2013-03-24 DIAGNOSIS — Z8669 Personal history of other diseases of the nervous system and sense organs: Secondary | ICD-10-CM | POA: Insufficient documentation

## 2013-03-24 DIAGNOSIS — M1990 Unspecified osteoarthritis, unspecified site: Secondary | ICD-10-CM | POA: Insufficient documentation

## 2013-03-24 DIAGNOSIS — Z7982 Long term (current) use of aspirin: Secondary | ICD-10-CM | POA: Insufficient documentation

## 2013-03-24 DIAGNOSIS — Z79899 Other long term (current) drug therapy: Secondary | ICD-10-CM | POA: Insufficient documentation

## 2013-03-24 DIAGNOSIS — E039 Hypothyroidism, unspecified: Secondary | ICD-10-CM | POA: Insufficient documentation

## 2013-03-24 DIAGNOSIS — M109 Gout, unspecified: Secondary | ICD-10-CM | POA: Insufficient documentation

## 2013-03-24 DIAGNOSIS — R079 Chest pain, unspecified: Secondary | ICD-10-CM

## 2013-03-24 DIAGNOSIS — I10 Essential (primary) hypertension: Secondary | ICD-10-CM | POA: Diagnosis present

## 2013-03-24 DIAGNOSIS — R0602 Shortness of breath: Secondary | ICD-10-CM | POA: Insufficient documentation

## 2013-03-24 DIAGNOSIS — IMO0002 Reserved for concepts with insufficient information to code with codable children: Secondary | ICD-10-CM | POA: Insufficient documentation

## 2013-03-24 DIAGNOSIS — Z8742 Personal history of other diseases of the female genital tract: Secondary | ICD-10-CM | POA: Insufficient documentation

## 2013-03-24 DIAGNOSIS — Z9889 Other specified postprocedural states: Secondary | ICD-10-CM | POA: Insufficient documentation

## 2013-03-24 DIAGNOSIS — E785 Hyperlipidemia, unspecified: Secondary | ICD-10-CM | POA: Insufficient documentation

## 2013-03-24 LAB — BASIC METABOLIC PANEL
CO2: 28 mEq/L (ref 19–32)
Calcium: 9.4 mg/dL (ref 8.4–10.5)
Chloride: 100 mEq/L (ref 96–112)
Sodium: 140 mEq/L (ref 135–145)

## 2013-03-24 LAB — CBC
Platelets: 193 10*3/uL (ref 150–400)
RBC: 4.56 MIL/uL (ref 3.87–5.11)
WBC: 10.3 10*3/uL (ref 4.0–10.5)

## 2013-03-24 LAB — POCT I-STAT TROPONIN I: Troponin i, poc: 0 ng/mL (ref 0.00–0.08)

## 2013-03-24 LAB — D-DIMER, QUANTITATIVE: D-Dimer, Quant: 0.33 ug/mL-FEU (ref 0.00–0.48)

## 2013-03-24 MED ORDER — NITROGLYCERIN 0.4 MG SL SUBL: 0.4000 mg | SUBLINGUAL_TABLET | SUBLINGUAL | Status: DC | PRN

## 2013-03-24 MED ORDER — MORPHINE SULFATE 4 MG/ML IJ SOLN
4.0000 mg | Freq: Once | INTRAMUSCULAR | Status: AC
  Administered 2013-03-24: 4 mg via INTRAVENOUS
  Filled 2013-03-24: qty 1

## 2013-03-24 MED ORDER — ASPIRIN 81 MG PO CHEW
324.0000 mg | CHEWABLE_TABLET | Freq: Once | ORAL | Status: AC
  Administered 2013-03-24: 324 mg via ORAL
  Filled 2013-03-24: qty 4

## 2013-03-24 MED ORDER — ACETAMINOPHEN 325 MG PO TABS
650.0000 mg | ORAL_TABLET | ORAL | Status: DC | PRN
  Administered 2013-03-25: 650 mg via ORAL
  Filled 2013-03-24: qty 2

## 2013-03-24 MED ORDER — ASPIRIN 81 MG PO CHEW
CHEWABLE_TABLET | ORAL | Status: AC
  Filled 2013-03-24: qty 4

## 2013-03-24 MED ORDER — ENOXAPARIN SODIUM 40 MG/0.4ML ~~LOC~~ SOLN
40.0000 mg | SUBCUTANEOUS | Status: DC
  Administered 2013-03-24 – 2013-03-25 (×2): 40 mg via SUBCUTANEOUS
  Filled 2013-03-24 (×3): qty 0.4

## 2013-03-24 MED ORDER — IRBESARTAN 150 MG PO TABS
150.0000 mg | ORAL_TABLET | Freq: Every day | ORAL | Status: DC
  Administered 2013-03-26: 150 mg via ORAL
  Filled 2013-03-24 (×2): qty 1

## 2013-03-24 MED ORDER — ASPIRIN EC 81 MG PO TBEC
81.0000 mg | DELAYED_RELEASE_TABLET | Freq: Every day | ORAL | Status: DC
  Administered 2013-03-25 – 2013-03-26 (×2): 81 mg via ORAL
  Filled 2013-03-24 (×2): qty 1

## 2013-03-24 MED ORDER — ONDANSETRON HCL 4 MG/2ML IJ SOLN: 4.0000 mg | Freq: Four times a day (QID) | INTRAMUSCULAR | Status: DC | PRN

## 2013-03-24 MED ORDER — SODIUM CHLORIDE 0.9 % IV SOLN: 250.0000 mL | INTRAVENOUS | Status: DC | PRN

## 2013-03-24 MED ORDER — SODIUM CHLORIDE 0.9 % IJ SOLN
3.0000 mL | Freq: Two times a day (BID) | INTRAMUSCULAR | Status: DC
  Administered 2013-03-24 – 2013-03-26 (×4): 3 mL via INTRAVENOUS

## 2013-03-24 MED ORDER — SODIUM CHLORIDE 0.9 % IJ SOLN: 3.0000 mL | INTRAMUSCULAR | Status: DC | PRN

## 2013-03-24 MED ORDER — LEVOTHYROXINE SODIUM 100 MCG PO TABS
100.0000 ug | ORAL_TABLET | Freq: Every day | ORAL | Status: DC
  Administered 2013-03-25 – 2013-03-26 (×2): 100 ug via ORAL
  Filled 2013-03-24 (×3): qty 1

## 2013-03-24 MED ORDER — NITROGLYCERIN 0.4 MG SL SUBL
0.4000 mg | SUBLINGUAL_TABLET | SUBLINGUAL | Status: DC | PRN
  Administered 2013-03-24 (×2): 0.4 mg via SUBLINGUAL
  Filled 2013-03-24: qty 25

## 2013-03-24 MED ORDER — ATORVASTATIN CALCIUM 10 MG PO TABS
10.0000 mg | ORAL_TABLET | Freq: Every day | ORAL | Status: DC
  Administered 2013-03-25: 10 mg via ORAL
  Filled 2013-03-24 (×2): qty 1

## 2013-03-24 NOTE — ED Notes (Signed)
Pt st's pain now a #2 on pain scale 0/10.  B/P 164/81  Pt remains alert and oriented x's 3, skin warm and dry color appropriate.  Family at bedside.

## 2013-03-24 NOTE — ED Provider Notes (Signed)
CSN: [id number]     Arrival date & time 03/24/13  1807 History   First MD Initiated Contact with Patient 03/24/13 1839     Chief Complaint  Patient presents with  . Chest Pain   (Consider location/radiation/quality/duration/timing/severity/associated sxs/prior Treatment) HPI Comments: 62 year old female with intermittent chest pain since this morning. She feels like there is pain in her face as well. She started about 12 hours ago. It then subsided and she went to work. However over the past few hours as come back. She states it feels like indigestion and a squeezing in her chest. Denies any back pain. She states the pain is sharp and even worse when she tries to take deep breaths. She's also having some mild shortness of breath. She's never felt this way before. She is on medicines for hypertension and followed with Dr. Elease Hashimoto of cardiology. She's not taking anything for the pain. She does like her lites are mildly swollen bilaterally. She's never had a blood clot, has not had any trips recently, and has not had any hemoptysis.   Past Medical History  Diagnosis Date  . Arthritis   . Hypertension   . Hyperlipidemia   . Gout   . Obesity   . Hot flashes   . 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 OA (osteoarthritis)   . Hypothyroidism   . Normal nuclear stress test 2004   Past Surgical History  Procedure Laterality Date  . Spine surgery  08/2008    T2  . Cholecystectomy  1987  . US echocardiography  07/25/2004    EF 55-60%  . Cardiovascular stress test  08/20/2002    EF 76%  . Tubal ligation  1975  . Hand surgery  1987 and 1988    bilateral  . Dilation and curettage of uterus  multiple   Family History  Problem Relation Age of Onset  . Heart failure Mother   . Hypertension Father   . Stroke Father   . Diabetes Father    History  Substance Use Topics  . Smoking status: Never Smoker   . Smokeless tobacco: Not on file  . Alcohol Use: No    OB History   Grav Para Term Preterm Abortions TAB SAB Ect Mult Living                 Review of Systems  Constitutional: Negative for diaphoresis.  Respiratory: Positive for shortness of breath. Negative for cough.   Cardiovascular: Positive for chest pain.  Gastrointestinal: Negative for nausea, vomiting and abdominal pain.  Musculoskeletal: Negative for back pain.  All other systems reviewed and are negative.    Allergies  Review of patient's allergies indicates no known allergies.  Home Medications   Current Outpatient Rx  Name  Route  Sig  Dispense  Refill  . allopurinol (ZYLOPRIM) 300 MG tablet   Oral   Take 300 mg by mouth daily as needed (for gout).          [name] aspirin 81 MG tablet   Oral   Take 81 mg by mouth daily.           . carvedilol (COREG) 6.25 MG tablet   Oral   Take 6.25 mg by mouth 2 (two) times daily with a meal.         . clobetasol (TEMOVATE) 0.05 % cream   Topical   Apply 1 application topically 2 (two) times daily as needed (  to rash).          [name] co-enzyme Q-10 30 MG capsule   Oral   Take 30 mg by mouth daily.         . colchicine 0.6 MG tablet   Oral   Take 0.6 mg by mouth daily as needed (for gout).          [name] desloratadine (CLARINEX) 5 MG tablet   Oral   Take 5 mg by mouth daily.         [name] esomeprazole (NEXIUM) 40 MG capsule   Oral   Take 40 mg by mouth daily as needed (for heartburn).         . fluticasone (CUTIVATE) 0.05 % cream   Topical   Apply 1 application topically 2 (two) times daily as needed (to rash).         . furosemide (LASIX) 20 MG tablet   Oral   Take 1 tablet (20 mg total) by mouth daily as needed.   30 tablet   2   . hydroxychloroquine (PLAQUENIL) 200 MG tablet   Oral   Take 400 mg by mouth 2 (two) times daily.          [name] levothyroxine (SYNTHROID, LEVOTHROID) 100 MCG tablet   Oral   Take 100 mcg by mouth daily.           . meloxicam (MOBIC) 7.5 MG tablet   Oral   Take 7.5 mg  by mouth 2 (two) times daily.         . rosuvastatin (CRESTOR) 5 MG tablet   Oral   Take 1 tablet (5 mg total) by mouth as needed.   30 tablet   11   . valsartan-hydrochlorothiazide (DIOVAN-HCT) 160-25 MG per tablet   Oral   Take 1-1.5 tablets by mouth daily.         . vitamin B-12 (CYANOCOBALAMIN) 1000 MCG tablet   Oral   Take 2,000 mcg by mouth daily.         . Lancets MISC   Does not apply   by Does not apply route. Use with Freestyle Lite glucometer          BP 166/98  Pulse 93  Temp(Src) 98.2 F (36.8 C)  Resp 20  Ht 5\' 8"  (1.727 m)  Wt 204 lb (92.534 kg)  BMI 31.03 kg/m2  SpO2 98% Physical Exam  Nursing note and vitals reviewed. Constitutional: She is oriented to person, place, and time. She appears well-developed and well-nourished.  HENT:  Head: Normocephalic and atraumatic.  Right Ear: External ear normal.  Left Ear: External ear normal.  Nose: Nose normal.  Eyes: Right eye exhibits no discharge. Left eye exhibits no discharge.  Cardiovascular: Normal rate, regular rhythm and normal heart sounds.   Pulmonary/Chest: Effort normal and breath sounds normal. She has no wheezes. She exhibits no tenderness.  Abdominal: Soft. There is no tenderness.  Musculoskeletal: She exhibits no edema and no tenderness.  Neurological: She is alert and oriented to person, place, and time.  Skin: Skin is warm and dry.    ED Course  Procedures (including critical care time) Labs Review Labs Reviewed  CBC - Abnormal; Notable for the following:    MCHC 36.2 (*)    All other components within normal limits  BASIC METABOLIC PANEL - Abnormal; Notable for the following:    GFR calc non Af Amer 89 (*)    All other components within normal limits  D-DIMER, QUANTITATIVE  POCT I-STAT TROPONIN I   Imaging Review Dg Chest 2 View  03/24/2013   CLINICAL DATA:  Chest pain.  EXAM: CHEST  2 VIEW  COMPARISON:  03/21/2012.  FINDINGS: Low volume chest. Bilateral basilar  atelectasis. No airspace disease. Cholecystectomy clips are present in the right upper quadrant. Monitoring leads project over the chest. Cardiopericardial silhouette and mediastinal contours appear within normal limits.  IMPRESSION: Low volume chest. No gross acute cardiopulmonary disease.   Electronically Signed   By: Andreas Newport M.D.   On: 03/24/2013 19:55    EKG Interpretation     Ventricular Rate:  91 PR Interval:  162 QRS Duration: 144 QT Interval:  404 QTC Calculation: 496 R Axis:   50 Text Interpretation:  Normal sinus rhythm Right bundle branch block T wave abnormality, consider inferolateral ischemia Abnormal ECG changes noted from most recent EKG in 2010            MDM   1. Chest pain    Patient with atyipcal chest pain with pleuritic symptoms. Is low risk for PE, and feel her risk is very low after neg ddimer. Pain resolved with nitro, then given morphine when it returned. Given her risk factors and new [name] will admit to cardiology.    Audree Camel, MD 03/25/13 [phone]

## 2013-03-24 NOTE — ED Notes (Addendum)
Pt st's chest pain has improved, rates pain a 3-4 on pain scale 0/10.  Pt also st's she had pain in her teeth earlier today that has now subsided.

## 2013-03-24 NOTE — H&P (Signed)
Cardiology History and Physical  SPEAR, TAMMY, MD  History of Present Illness (and review of medical records): Sydney Wolfe is a 62 y.o. female who presents for evaluation of chest pain.  Pain started around 9am this am.  Pain felt like indigestion.  She took some gas medications and pain subsided around noon.  However pain returned around 2pm.  Pain was midsternal.  She states some discomfort when taking a deep breath.  No other associated symptoms.  She has had no prior symptoms of pain.  She was given ASA and NTG in ED.  Pain is now 2/10.  Previous diagnostic testing for coronary artery disease includes: prior normal stress test.. Previous history of cardiac disease includes Cardiomyopathy. Coronary artery disease risk factors include: dyslipidemia, hypertension and obesity (BMI >= 30 kg/m2). Patient denies history of coronary artery disease, coronary artery stent and previous M.I..  Review of Systems Further review of systems was otherwise negative other than stated in HPI.  Patient Active Problem List   Diagnosis Date Noted  . Lymphadenopathy-left axilla 04/13/2012  . HTN (hypertension) 02/06/2011  . Hypothyroidism 02/06/2011  . Hyperlipidemia 02/06/2011   Past Medical History  Diagnosis Date  . Arthritis   . Hypertension   . Hyperlipidemia   . Gout   . Obesity   . Hot flashes   . 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 OA (osteoarthritis)   . Hypothyroidism   . Normal nuclear stress test 2004    Past Surgical History  Procedure Laterality Date  . Spine surgery  08/2008    T2  . Cholecystectomy  1987  . US echocardiography  07/25/2004    EF 55-60%  . Cardiovascular stress test  08/20/2002    EF 76%  . Tubal ligation  1975  . Hand surgery  1987 and 1988    bilateral  . Dilation and curettage of uterus  multiple     (Not in a hospital admission) No Known Allergies  History  Substance Use Topics  . Smoking status: Never Smoker    . Smokeless tobacco: Not on file  . Alcohol Use: No    Family History  Problem Relation Age of Onset  . Heart failure Mother   . Hypertension Father   . Stroke Father   . Diabetes Father      Objective:  Patient Vitals for the past 8 hrs:  BP Temp Pulse Resp SpO2 Height Weight  03/24/13 1814 166/98 mmHg 98.2 F (36.8 C) 93 20 98 % 5\' 8"  (1.727 m) 92.534 kg (204 lb)   General appearance: alert, cooperative, appears stated age and no distress Head: Normocephalic, without obvious abnormality, atraumatic Eyes: conjunctivae/corneas clear. PERRL, EOM's intact. Fundi benign. Neck: no carotid bruit, no JVD and supple, symmetrical, trachea midline Lungs: clear to auscultation bilaterally Chest wall: no tenderness Heart: regular rate and rhythm, S1, S2 normal, no murmur, click, rub or gallop Abdomen: soft, non-tender; bowel sounds normal; no masses,  no organomegaly Extremities: extremities normal, atraumatic, no cyanosis or edema Pulses: 2+ and symmetric Neurologic: Grossly normal  Results for orders placed during the hospital encounter of 03/24/13 (from the past 48 hour(s))  CBC     Status: Abnormal   Collection Time    03/24/13  6:18 PM      Result Value Range   WBC 10.3  4.0 - 10.5 K/uL   RBC 4.56  3.87 - 5.11 MIL/uL   Hemoglobin 15.0  12.0 - 15.0 g/dL   HCT 40.3  47.4 - 25.9 %   MCV 90.8  78.0 - 100.0 fL   MCH 32.9  26.0 - 34.0 pg   MCHC 36.2 (*) 30.0 - 36.0 g/dL   RDW 56.3  87.5 - 64.3 %   Platelets 193  150 - 400 K/uL  BASIC METABOLIC PANEL     Status: Abnormal   Collection Time    03/24/13  6:18 PM      Result Value Range   Sodium 140  135 - 145 mEq/L   Potassium 3.6  3.5 - 5.1 mEq/L   Chloride 100  96 - 112 mEq/L   CO2 28  19 - 32 mEq/L   Glucose, Bld 94  70 - 99 mg/dL   BUN 12  6 - 23 mg/dL   Creatinine, Ser 3.29  0.50 - 1.10 mg/dL   Calcium 9.4  8.4 - 51.8 mg/dL   GFR calc non Af Amer 89 (*) >90 mL/min   GFR calc Af Amer >90  >90 mL/min   Comment: (NOTE)      The eGFR has been calculated using the CKD EPI equation.     This calculation has not been validated in all clinical situations.     eGFR's persistently <90 mL/min signify possible Chronic Kidney     Disease.  D-DIMER, QUANTITATIVE     Status: None   Collection Time    03/24/13  7:00 PM      Result Value Range   D-Dimer, Quant 0.33  0.00 - 0.48 ug/mL-FEU   Comment:            AT THE INHOUSE ESTABLISHED CUTOFF     VALUE OF 0.48 ug/mL FEU,     THIS ASSAY HAS BEEN DOCUMENTED     IN THE LITERATURE TO HAVE     A SENSITIVITY AND NEGATIVE     PREDICTIVE VALUE OF AT LEAST     98 TO 99%.  THE TEST RESULT     SHOULD BE CORRELATED WITH     AN ASSESSMENT OF THE CLINICAL     PROBABILITY OF DVT / VTE.  POCT I-STAT TROPONIN I     Status: None   Collection Time    03/24/13  7:07 PM      Result Value Range   Troponin i, poc 0.00  0.00 - 0.08 ng/mL   Comment 3            Comment: Due to the release kinetics of cTnI,     a negative result within the first hours     of the onset of symptoms does not rule out     myocardial infarction with certainty.     If myocardial infarction is still suspected,     repeat the test at appropriate intervals.   Dg Chest 2 View  03/24/2013   CLINICAL DATA:  Chest pain.  EXAM: CHEST  2 VIEW  COMPARISON:  03/21/2012.  FINDINGS: Low volume chest. Bilateral basilar atelectasis. No airspace disease. Cholecystectomy clips are present in the right upper quadrant. Monitoring leads project over the chest. Cardiopericardial silhouette and mediastinal contours appear within normal limits.  IMPRESSION: Low volume chest. No gross acute cardiopulmonary disease.   Electronically Signed   By: Andreas Newport M.D.   On: 03/24/2013 19:55    ECG:  Sinus rhythm [name] HR 91. Had previously incomplete [name]  Assessment: 64F with chest pain x 1 day with hx of HTN, HLD,  no previous known CAD.  No acute ischemic changes on ecg, trop negative.     Plan:  1. Cardiology  Observation 2. Continuous monitoring on Telemetry. 3. Repeat ekg on admit, prn chest pain or arrythmia 4. Trend cardiac biomarkers, hgba1c, tsh 5. ASA, no need for heparin. NTG prn for pain 6. Continue home BP meds. 7. Will keep NPO, if troponins remain negative, consider for noninvasive myocardial perfusion study.

## 2013-03-24 NOTE — ED Notes (Signed)
The pt has had mid chest pain since this am.  She has more pain whenever she takes a deep breath.  She reports that it feels like indigestion and she is burping

## 2013-03-24 NOTE — ED Notes (Signed)
Pt rates pain 2-3 on pain scale 0/10.  2nd NTG given  B/P 164/81 P 93

## 2013-03-25 ENCOUNTER — Encounter (HOSPITAL_COMMUNITY): Payer: Self-pay

## 2013-03-25 ENCOUNTER — Observation Stay (HOSPITAL_COMMUNITY): Payer: BC Managed Care – PPO

## 2013-03-25 DIAGNOSIS — R0789 Other chest pain: Secondary | ICD-10-CM | POA: Diagnosis present

## 2013-03-25 DIAGNOSIS — E039 Hypothyroidism, unspecified: Secondary | ICD-10-CM

## 2013-03-25 DIAGNOSIS — R079 Chest pain, unspecified: Secondary | ICD-10-CM

## 2013-03-25 DIAGNOSIS — I10 Essential (primary) hypertension: Secondary | ICD-10-CM

## 2013-03-25 DIAGNOSIS — E785 Hyperlipidemia, unspecified: Secondary | ICD-10-CM

## 2013-03-25 LAB — BASIC METABOLIC PANEL
BUN: 12 mg/dL (ref 6–23)
CO2: 26 mEq/L (ref 19–32)
Calcium: 8.7 mg/dL (ref 8.4–10.5)
Glucose, Bld: 85 mg/dL (ref 70–99)
Potassium: 3.4 mEq/L — ABNORMAL LOW (ref 3.5–5.1)
Sodium: 142 mEq/L (ref 135–145)

## 2013-03-25 LAB — CBC
HCT: 38.6 % (ref 36.0–46.0)
Hemoglobin: 13.5 g/dL (ref 12.0–15.0)
MCH: 31.9 pg (ref 26.0–34.0)
MCV: 91.3 fL (ref 78.0–100.0)
RBC: 4.23 MIL/uL (ref 3.87–5.11)
WBC: 6.5 10*3/uL (ref 4.0–10.5)

## 2013-03-25 LAB — PROTIME-INR: INR: 1 (ref 0.00–1.49)

## 2013-03-25 LAB — HEMOGLOBIN A1C: Hgb A1c MFr Bld: 5.6 % (ref <5.7)

## 2013-03-25 MED ORDER — GADOBENATE DIMEGLUMINE 529 MG/ML IV SOLN
30.0000 mL | Freq: Once | INTRAVENOUS | Status: AC
  Administered 2013-03-25: 30 mL via INTRAVENOUS

## 2013-03-25 MED ORDER — POTASSIUM CHLORIDE CRYS ER 20 MEQ PO TBCR
40.0000 meq | EXTENDED_RELEASE_TABLET | Freq: Once | ORAL | Status: AC
  Administered 2013-03-25: 40 meq via ORAL
  Filled 2013-03-25: qty 2

## 2013-03-25 NOTE — Discharge Summary (Signed)
CARDIOLOGY DISCHARGE SUMMARY   Patient ID: Sydney Wolfe MRN: [id number] DOB/AGE: [birth year] 63 y.o.  Admit date: 03/24/2013 Discharge date: 03/26/2013  Primary Discharge Diagnosis:    Chest pain, mid sternal - for OP stress test Secondary Discharge Diagnosis:    HTN (hypertension)  Procedures: Cardiac MRI  Hospital Course: Sydney Wolfe is a 62 y.o. female with no history of CAD. She had several episodes of chest pain and came to the ER, where she was admitted for further evaluation and treatment.   Her cardiac enzymes were negative for MI. Her symptoms were concerning for possible pericarditis, but no rub was noted on exam.   Her blood pressure was well-controlled. She had been on Diovan-HCT prior to admission. Her diuretic was held on admission and she was continued on her ARB. Her Coreg was held. However, her BP was too low to take the ARB and it was held. At discharge, she will go home on her Coreg but hold the Diovan-HCT for now. She is encouraged to follow her BP at home. Follow up with primary care for further BP management.  Once her enzymes were negative, a cardiac MRI plus calcium scoring was performed. Her calcium score was elevated, but the MRI did not show any acute abnormalities and her EF is normal.  There was concern for spasm, which her sister has, so she will continue to use nitrates for pain. Her previous ECG had an incomplete [name]. The ECG on admission (possibly still having pain) shows a [name]. This resolved on a subsequent ECG. Sydney Wolfe is encouraged to seek immediate help if her symptoms recur.  On 10/31, Sydney Wolfe was seen by Dr. Delton See. She was ambulating without chest pain or SOB and considered stable for discharge, to follow up as an outpatient with a stress test.   Labs: Lab Results  Component Value Date   WBC 6.5 03/25/2013   HGB 13.5 03/25/2013   HCT 38.6 03/25/2013   MCV 91.3 03/25/2013   PLT 159 03/25/2013     Recent Labs Lab  03/25/13 0345  NA 142  K 3.4*  CL 104  CO2 26  BUN 12  CREATININE 0.73  CALCIUM 8.7  GLUCOSE 85    Recent Labs  03/24/13 2324 03/25/13 0345  TROPONINI <0.30 <0.30    Recent Labs  03/25/13 0345  INR 1.00      Radiology: Dg Chest 2 View 03/24/2013   CLINICAL DATA:  Chest pain.  EXAM: CHEST  2 VIEW  COMPARISON:  03/21/2012.  FINDINGS: Low volume chest. Bilateral basilar atelectasis. No airspace disease. Cholecystectomy clips are present in the right upper quadrant. Monitoring leads project over the chest. Cardiopericardial silhouette and mediastinal contours appear within normal limits.  IMPRESSION: Low volume chest. No gross acute cardiopulmonary disease.   Electronically Signed   By: Andreas Newport M.D.   On: 03/24/2013 19:55   Mr Card Morphology Wo/w Cm 03/26/2013   CLINICAL DATA:  62 year old female with atypical chest pain. Evaluate for possible pericarditis.  EXAM: CARDIAC MRI  TECHNIQUE: The patient was scanned on a 1.5 Tesla GE magnet. A dedicated cardiac coil was used. Functional imaging was done using Fiesta sequences. 2,3, and 4 chamber views were done to assess for RWMA's. Modified Simpson's rule using a short axis stack was used to calculate an ejection fraction on a dedicated work [employer]. The patient received 30 cc of Multihance. After 10 minutes inversion recovery sequences were used  to assess for infiltration and scar tissue.  CONTRAST:  30 cc Multihance  FINDINGS: 1. Left ventricle has normal size and function (LVEF 54%, LVEDD 45 mm). There are no wall motion abnormalities.  LVEDV 105 ml  LVESV 51 ml  SV 59 ml  2. Right ventricle has normal size and function (RVEF 50%)  RVEDV 118 ml  RVESV 60 ml  SV 58 ml  Myocardial mass 90 gm  3.  Normal size right and left atrium (34 mm).  4.  No late gadolinium enhancement in the myo or pericardium.  5.  Pericardium has normal thickness, no LGE.  6.  There is no resting perfusion defect in the left ventricle.  7.  Mild to moderate aortic insufficiency. Moderate mitral regurgitation. Mild tricuspid regurgitation, tricuspid aortic valve.  8. Aortic root 31 mm, ascending aorta 34 mm, main pulmonary artery 29 mm.  IMPRESSION: 1. Left ventricle has normal size and function (LVEF 54%, LVEDD 45 mm). There are no wall motion abnormalities.  2. Right ventricle has normal size and function (RVEF 50%).  3.  No signs of pericarditis.  4.  No perfusion defect.  [name]   Electronically Signed   By: [name]   On: 03/26/2013 11:35   EKG: 26-Mar-2013 04:10:14  Normal sinus rhythm (?incomplete [name]) Normal ECG [name] and Twave changes RESOLVED SINCE PREVIOUS Vent. rate 74 BPM PR interval 166 Sydney QRS duration 98 Sydney QT/QTc 404/448 Sydney P-R-T axes 30 20 15   24-Mar-2013 18:14:14  Normal sinus rhythm Right bundle branch block T wave abnormality, consider inferolateral ischemia changes noted from most recent EKG in 2010 Vent. rate 91 BPM PR interval 162 Sydney QRS duration 144 Sydney QT/QTc 404/496 Sydney P-R-T axes 24 50 -37  FOLLOW UP PLANS AND APPOINTMENTS No Known Allergies   Medication List    STOP taking these medications       valsartan-hydrochlorothiazide 160-25 MG per tablet  Commonly known as:  DIOVAN-HCT      TAKE these medications       allopurinol 300 MG tablet  Commonly known as:  ZYLOPRIM  Take 300 mg by mouth daily as needed (for gout).     aspirin 81 MG tablet  Take 81 mg by mouth daily.     carvedilol 6.25 MG tablet  Commonly known as:  COREG  Take 6.25 mg by mouth 2 (two) times daily with a meal.     clobetasol cream 0.05 %  Commonly known as:  TEMOVATE  Apply 1 application topically 2 (two) times daily as needed (to rash).     co-enzyme Q-10 30 MG capsule  Take 30 mg by mouth daily.     colchicine 0.6 MG tablet  Take 0.6 mg by mouth daily as needed (for gout).     desloratadine 5 MG tablet  Commonly known as:  CLARINEX  Take 5 mg by mouth daily.     esomeprazole 40 MG  capsule  Commonly known as:  NEXIUM  Take 40 mg by mouth daily as needed (for heartburn).     fluticasone 0.05 % cream  Commonly known as:  CUTIVATE  Apply 1 application topically 2 (two) times daily as needed (to rash).     furosemide 20 MG tablet  Commonly known as:  LASIX  Take 1 tablet (20 mg total) by mouth daily as needed.     hydroxychloroquine 200 MG tablet  Commonly known as:  PLAQUENIL  Take 400 mg by mouth 2 (two) times daily.  Lancets Misc  by Does not apply route. Use with Freestyle Lite glucometer     levothyroxine 100 MCG tablet  Commonly known as:  SYNTHROID, LEVOTHROID  Take 100 mcg by mouth daily.     meloxicam 7.5 MG tablet  Commonly known as:  MOBIC  Take 7.5 mg by mouth 2 (two) times daily.     nitroGLYCERIN 0.4 MG SL tablet  Commonly known as:  NITROSTAT  Place 1 tablet (0.4 mg total) under the tongue every 5 (five) minutes as needed for chest pain.     rosuvastatin 5 MG tablet  Commonly known as:  CRESTOR  Take 1 tablet (5 mg total) by mouth as needed.     vitamin B-12 1000 MCG tablet  Commonly known as:  CYANOCOBALAMIN  Take 2,000 mcg by mouth daily.        Discharge Orders   Future Appointments Provider Department Dept Phone   04/12/2013 9:45 AM [facility] [place]) [facility] SITE 3 NUCLEAR MED [phone]   05/11/2013 9:00 AM [facility] [place] Office [phone]   05/11/2013 9:15 AM Vesta Mixer, MD [facility] [phone]   Future Orders Complete By Expires   Diet - low sodium heart healthy  As directed    Increase activity slowly  As directed      Follow-up Information   Follow up with [facility] On 04/12/2013. (Treadmill stress test at 9:45 am.)    Contact information:   [address] [place] [address]       Follow up with Elyn Aquas., MD On 05/11/2013. (Keep 9:00 am appt for lab work, then MD appt at 9:15 am)     Specialty:  Cardiology   Contact information:   [address] [place] [address] [phone]       BRING ALL MEDICATIONS WITH YOU TO FOLLOW UP APPOINTMENTS  Time spent with patient to include physician time: 36 min Signed: Theodore Demark, PA-C 03/26/2013, 2:21 PM Co-Sign MD

## 2013-03-25 NOTE — Progress Notes (Signed)
Per Harle Stanford in MRI it is ok for patient to eat. Order for a heart healthy diet obtained from [facility].

## 2013-03-25 NOTE — Progress Notes (Signed)
Patient Name: Sydney Wolfe Date of Encounter: 03/25/2013   Principal Problem:   Chest pain, mid sternal Active Problems:   HTN (hypertension)    SUBJECTIVE  The patient feels better today, but still has CP on deep inspiration.  CURRENT MEDS . aspirin EC  81 mg Oral Daily  . atorvastatin  10 mg Oral q1800  . enoxaparin (LOVENOX) injection  40 mg Subcutaneous Q24H  . irbesartan  150 mg Oral Daily  . levothyroxine  100 mcg Oral QAC breakfast  . sodium chloride  3 mL Intravenous Q12H    OBJECTIVE  Filed Vitals:   03/24/13 2153 03/25/13 0546 03/25/13 0609 03/25/13 0900  BP: 122/76 95/63 113/67 97/56  Pulse: 84 85  79  Temp: 98.5 F (36.9 C) 99 F (37.2 C)  98.6 F (37 C)  TempSrc:  Oral  Oral  Resp: 20 18  18   Height: 5\' 10"  (1.778 m)     Weight: 210 lb 8 oz (95.482 kg)     SpO2: 94% 94%  96%    Intake/Output Summary (Last 24 hours) at 03/25/13 1031 Last data filed at 03/24/13 2324  Gross per 24 hour  Intake      3 ml  Output      0 ml  Net      3 ml   Filed Weights   03/24/13 1814 03/24/13 2153  Weight: 204 lb (92.534 kg) 210 lb 8 oz (95.482 kg)    PHYSICAL EXAM  General: Pleasant, NAD. Neuro: Alert and oriented X 3. Moves all extremities spontaneously. Psych: Normal affect. HEENT:  Normal  Neck: Supple without bruits or JVD. Lungs:  Resp regular and unlabored, CTA. Heart: RRR no s3, s4, or murmurs. + rub Abdomen: Soft, non-tender, non-distended, BS + x 4.  Extremities: No clubbing, cyanosis or edema. DP/PT/Radials 2+ and equal bilaterally.  Accessory Clinical Findings  CBC  Recent Labs  03/24/13 1818 03/25/13 0345  WBC 10.3 6.5  HGB 15.0 13.5  HCT 41.4 38.6  MCV 90.8 91.3  PLT 193 159   Basic Metabolic Panel  Recent Labs  03/24/13 1818 03/25/13 0345  NA 140 142  K 3.6 3.4*  CL 100 104  CO2 28 26  GLUCOSE 94 85  BUN 12 12  CREATININE 0.74 0.73  CALCIUM 9.4 8.7   Cardiac Enzymes  Recent Labs  03/24/13 2324  03/25/13 0345  TROPONINI <0.30 <0.30   D-Dimer  Recent Labs  03/24/13 1900  DDIMER 0.33   TELE  SR, HR 70-90 BPM  ECG  SR, [name]  Radiology/Studies  Dg Chest 2 View  03/24/2013   CLINICAL DATA:  Chest pain.  EXAM: CHEST  2 VIEW  COMPARISON:  03/21/2012.  FINDINGS: Low volume chest. Bilateral basilar atelectasis. No airspace disease. Cholecystectomy clips are present in the right upper quadrant. Monitoring leads project over the chest. Cardiopericardial silhouette and mediastinal contours appear within normal limits.  IMPRESSION: Low volume chest. No gross acute cardiopulmonary disease.   Electronically Signed   By: Andreas Newport M.D.   On: 03/24/2013 19:55     ASSESSMENT AND PLAN  54F with chest pain x 1 day with hx of HTN, HLD, no previous known CAD. No acute ischemic changes on ecg, trop negative x 3.  1. Chest pain - positional, worsens with deep inspiration, rub on the exam, we will attempt a stress MRI to rule our pericarditis and ischemia. Continue aspirin.  2. HTN - controlled - continue ARB  3. Hypokalemia -  we will replace  Signed, [name], H MD 03/25/2013

## 2013-03-26 DIAGNOSIS — R072 Precordial pain: Secondary | ICD-10-CM

## 2013-03-26 MED ORDER — NITROGLYCERIN 0.4 MG SL SUBL: 0.4000 mg | SUBLINGUAL_TABLET | SUBLINGUAL | Status: DC | PRN

## 2013-03-26 NOTE — Progress Notes (Signed)
Patient Name: Sydney Wolfe Date of Encounter: 03/26/2013   Principal Problem:   Chest pain, mid sternal Active Problems:   HTN (hypertension)    SUBJECTIVE  The patient feels better today, no more chest pain.  CURRENT MEDS . aspirin EC  81 mg Oral Daily  . atorvastatin  10 mg Oral q1800  . enoxaparin (LOVENOX) injection  40 mg Subcutaneous Q24H  . irbesartan  150 mg Oral Daily  . levothyroxine  100 mcg Oral QAC breakfast  . sodium chloride  3 mL Intravenous Q12H    OBJECTIVE  Filed Vitals:   03/25/13 1420 03/25/13 1945 03/26/13 0407 03/26/13 1203  BP: 117/62 105/68 97/61 111/67  Pulse: 83 89 76 84  Temp: 97.4 F (36.3 C)  98 F (36.7 C) 97.5 F (36.4 C)  TempSrc: Oral   Oral  Resp: 16 20 18 18   Height:      Weight:      SpO2: 95% 95% 96% 81%    Intake/Output Summary (Last 24 hours) at 03/26/13 1216 Last data filed at 03/25/13 2202  Gross per 24 hour  Intake      3 ml  Output      0 ml  Net      3 ml   Filed Weights   03/24/13 1814 03/24/13 2153  Weight: 204 lb (92.534 kg) 210 lb 8 oz (95.482 kg)    PHYSICAL EXAM  General: Pleasant, NAD. Neuro: Alert and oriented X 3. Moves all extremities spontaneously. Psych: Normal affect. HEENT:  Normal  Neck: Supple without bruits or JVD. Lungs:  Resp regular and unlabored, CTA. Heart: RRR no s3, s4, or murmurs. + rub Abdomen: Soft, non-tender, non-distended, BS + x 4.  Extremities: No clubbing, cyanosis or edema. DP/PT/Radials 2+ and equal bilaterally.  Accessory Clinical Findings  CBC  Recent Labs  03/24/13 1818 03/25/13 0345  WBC 10.3 6.5  HGB 15.0 13.5  HCT 41.4 38.6  MCV 90.8 91.3  PLT 193 159   Basic Metabolic Panel  Recent Labs  03/24/13 1818 03/25/13 0345  NA 140 142  K 3.6 3.4*  CL 100 104  CO2 28 26  GLUCOSE 94 85  BUN 12 12  CREATININE 0.74 0.73  CALCIUM 9.4 8.7   Cardiac Enzymes  Recent Labs  03/24/13 2324 03/25/13 0345  TROPONINI <0.30 <0.30    D-Dimer  Recent Labs  03/24/13 1900  DDIMER 0.33   TELE  SR, HR 70-90 BPM  ECG  SR, [name]  Radiology/Studies  Dg Chest 2 View  03/24/2013   CLINICAL DATA:  Chest pain.  EXAM: CHEST  2 VIEW  COMPARISON:  03/21/2012.  FINDINGS: Low volume chest. Bilateral basilar atelectasis. No airspace disease. Cholecystectomy clips are present in the right upper quadrant. Monitoring leads project over the chest. Cardiopericardial silhouette and mediastinal contours appear within normal limits.  IMPRESSION: Low volume chest. No gross acute cardiopulmonary disease.   Electronically Signed   By: Andreas Newport M.D.   On: 03/24/2013 19:55     ASSESSMENT AND PLAN  34F with chest pain x 1 day with hx of HTN, HLD, no previous known CAD. No acute ischemic changes on ecg, trop negative x 3.  1. Chest pain - resolved, cardiac MRI showed normal LV and RV function, mild TR, moderate MR, mild to moderate AI. No signs of pericarditis, no resting perfusion defect. We wil discharge her home with an early follow up in our clinic with a stress test - exercise  nuclear test.  2. HTN - controlled - continue ARB   Signed, [name], H MD 03/26/2013

## 2013-03-29 NOTE — Discharge Summary (Signed)
Sydney Wolfe, H 03/29/2013

## 2013-03-30 ENCOUNTER — Other Ambulatory Visit: Payer: Self-pay | Admitting: Cardiovascular Disease

## 2013-03-30 NOTE — Telephone Encounter (Signed)
Jodette, please clarify coreg, Dr Elease Hashimoto last note states to d/c this med, but at discharge from hospital still on list, please refill if appropriate.

## 2013-04-01 ENCOUNTER — Other Ambulatory Visit: Payer: Self-pay

## 2013-04-01 ENCOUNTER — Telehealth: Payer: Self-pay | Admitting: *Deleted

## 2013-04-01 NOTE — Telephone Encounter (Signed)
Pt was recently in the hospital. Records reviewed and pt confirmed she is on carvedilol as directed/ metoprolol was stopped.

## 2013-04-12 ENCOUNTER — Ambulatory Visit (HOSPITAL_COMMUNITY): Payer: BC Managed Care – PPO | Attending: Cardiovascular Disease | Admitting: Radiology

## 2013-04-12 VITALS — BP 186/101 | Ht 70.0 in | Wt 205.0 lb

## 2013-04-12 DIAGNOSIS — R0602 Shortness of breath: Secondary | ICD-10-CM | POA: Insufficient documentation

## 2013-04-12 DIAGNOSIS — R079 Chest pain, unspecified: Secondary | ICD-10-CM | POA: Insufficient documentation

## 2013-04-12 MED ORDER — TECHNETIUM TC 99M SESTAMIBI GENERIC - CARDIOLITE
33.0000 | Freq: Once | INTRAVENOUS | Status: AC | PRN
  Administered 2013-04-12: 33 via INTRAVENOUS

## 2013-04-12 MED ORDER — AMINOPHYLLINE 25 MG/ML IV SOLN
75.0000 mg | Freq: Once | INTRAVENOUS | Status: AC
  Administered 2013-04-12: 75 mg via INTRAVENOUS

## 2013-04-12 MED ORDER — REGADENOSON 0.4 MG/5ML IV SOLN
0.4000 mg | Freq: Once | INTRAVENOUS | Status: AC
  Administered 2013-04-12: 0.4 mg via INTRAVENOUS

## 2013-04-12 MED ORDER — TECHNETIUM TC 99M SESTAMIBI GENERIC - CARDIOLITE
11.0000 | Freq: Once | INTRAVENOUS | Status: AC | PRN
  Administered 2013-04-12: 11 via INTRAVENOUS

## 2013-04-12 NOTE — Progress Notes (Signed)
[facility] SITE 3 NUCLEAR MED [address] [place], [place] [address] [phone]    Cardiology Nuclear Med Study  Sydney Wolfe is a 62 y.o. female     MRN : [id number]     DOB: 04-22-1951  Procedure Date: 04/12/2013  Nuclear Med Background Indication for Stress Test:  Evaluation for Ischemia, Post Hospital- (10/14- CP) and Abnormal EKG History:  ECHO,06',EF:55-60%;MR EF 54%;Previous Nuclear Study 04' EF 76%;Asthma /allergy R/T Cardiac Risk Factors: Family History - CAD, Hypertension, Lipids and [name]  Symptoms:  Chest Pain, DOE and SOB   Nuclear Pre-Procedure Caffeine/Decaff Intake:  None NPO After: 9:00pm   Lungs:  clear O2 Sat: 95% on room air. IV 0.9% NS with Angio Cath:  22g  IV Site: R Wrist  IV Started by:  Bonnita Levan, RN  Chest Size (in):  40 Cup Size: D  Height: 5\' 10"  (1.778 m)  Weight:  205 lb (92.987 kg)  BMI:  Body mass index is 29.41 kg/(m^2). Tech Comments:  Coreg held x 24 hrs, BP/IV in Right arm only Aminophylline 75 mg IV given for symptoms, and all were resolved.    Nuclear Med Study 1 or 2 day study: 1 day  Stress Test Type:  Stress  Reading MD: [name], MD  Order Authorizing Provider:  [name], MD  Resting Radionuclide: Technetium 64m Sestamibi  Resting Radionuclide Dose: 11.0 mCi   Stress Radionuclide:  Technetium 76m Sestamibi  Stress Radionuclide Dose: 33.0 mCi           Stress Protocol Rest HR: 100 Stress HR: 113  Rest BP: 186/101 Stress BP: 157/60  Exercise Time (min): n/a METS: n/a   Predicted Max HR: 157 bpm % Max HR: 63.69 bpm Rate Pressure Product: 21308   Dose of Adenosine (mg):  n/a Dose of Lexiscan: 0.4 mg  Dose of Atropine (mg): n/a Dose of Dobutamine: n/a mcg/kg/min (at max HR)  Stress Test Technologist: [name], EMT-P  Nuclear Technologist:  [name], CNMT     Rest Procedure:  Myocardial perfusion imaging was performed at rest 45 minutes following the intravenous administration of  Technetium 26m Sestamibi. Rest ECG: NSR, [name]  Stress Procedure:  The patient received IV Lexiscan 0.4 mg over 15-seconds.  Technetium 51m Sestamibi injected at 30-seconds.  Quantitative spect images were obtained after a 45 minute delay. Stress ECG: No significant change from baseline ECG  QPS Raw Data Images:  Normal; no motion artifact; normal heart/lung ratio. Stress Images:  Normal homogeneous uptake in all areas of the myocardium. Rest Images:  Normal homogeneous uptake in all areas of the myocardium. Subtraction (SDS):  No evidence of ischemia. Transient Ischemic Dilatation (Normal <1.22):  1.05 Lung/Heart Ratio (Normal <0.45):  0.27  Quantitative Gated Spect Images QGS EDV:  70 ml QGS ESV:  19 ml  Impression Exercise Capacity:  Lexiscan with low level exercise. BP Response:  Normal blood pressure response. Clinical Symptoms:  Mild chest pain/dyspnea. ECG Impression:  No significant ST segment change suggestive of ischemia. Comparison with Prior Nuclear Study: No significant change from previous study  Overall Impression:  Normal stress nuclear study.  LV Ejection Fraction: 73%.  LV Wall Motion:  NL LV Function; NL Wall Motion  [name], [name] 04/12/2013

## 2013-04-13 ENCOUNTER — Telehealth: Payer: Self-pay | Admitting: *Deleted

## 2013-04-13 NOTE — Telephone Encounter (Signed)
Results of stress test reviewed. Pt states: something is not right, Im having SOB, c/o fatigue, "I haven't felt the same since CP episode", weakness. Pt told I will call her later this afternoon once Dr Elease Hashimoto gives advice.  Pt agreed to plan.

## 2013-04-13 NOTE — Telephone Encounter (Signed)
App made in [place] to discuss sx with Dr Elease Hashimoto, pt agreed to plan.

## 2013-04-13 NOTE — Telephone Encounter (Signed)
Message copied by Antony Odea on Tue Apr 13, 2013 12:32 PM ------      Message from: Vesta Mixer      Created: Tue Apr 13, 2013 10:52 AM       Normal stress myoview       ------

## 2013-04-15 ENCOUNTER — Ambulatory Visit (INDEPENDENT_AMBULATORY_CARE_PROVIDER_SITE_OTHER): Payer: BC Managed Care – PPO | Admitting: Cardiovascular Disease

## 2013-04-15 ENCOUNTER — Encounter: Payer: Self-pay | Admitting: Cardiovascular Disease

## 2013-04-15 VITALS — BP 143/83 | HR 72 | Ht 70.0 in | Wt 207.2 lb

## 2013-04-15 DIAGNOSIS — R072 Precordial pain: Secondary | ICD-10-CM

## 2013-04-15 DIAGNOSIS — I10 Essential (primary) hypertension: Secondary | ICD-10-CM

## 2013-04-15 DIAGNOSIS — R079 Chest pain, unspecified: Secondary | ICD-10-CM

## 2013-04-15 DIAGNOSIS — R0789 Other chest pain: Secondary | ICD-10-CM

## 2013-04-15 MED ORDER — HYDROCHLOROTHIAZIDE 25 MG PO TABS: 25.0000 mg | ORAL_TABLET | Freq: Every day | ORAL | Status: DC

## 2013-04-15 NOTE — Assessment & Plan Note (Signed)
Her last TSH was in 2012. We'll check her TSH today.

## 2013-04-15 NOTE — Progress Notes (Signed)
Sydney Wolfe Date of Birth  26-Jun-1950 [facility]     [place] Office  [address]    [address]   [address] [place], [place]  [address]    [place], [place]  [address] [phone]  Fax  [phone]  [phone]  Fax [phone]  Problems: 1. Hypertension 2. Hyperlipidemia 3. Mild obesity.  History of Present Illness:  Sydney Wolfe is a 62 y.o. female with the above noted hx.  She has done very well. She has not had any episodes of chest pain or shortness breath. She has been exercising on regular basis.  November 17, 2012:  She is tolerating the coreg well but has noticed some photosensitivity ( listed by epocrates also as a side effect)  Nov. 20, 2014:  Sydney Wolfe was admitted to the hosptial with CP several weeks ago.  She rule out of MI,  MRI of the heart was normal.  Stress myoview 04/13/13 was normal.    She had continued to have fatigue, variable BP.   She also has dyspnea.  Her 2 sisters have cardiac issues Sydney Wolfe)    Current Outpatient Prescriptions on File Prior to Visit  Medication Sig Dispense Refill  . allopurinol (ZYLOPRIM) 300 MG tablet Take 300 mg by mouth daily as needed (for gout).       [name] aspirin 81 MG tablet Take 81 mg by mouth daily.        . carvedilol (COREG) 6.25 MG tablet TAKE 1 TABLET BY MOUTH TWICE A DAY  60 tablet  5  . clobetasol (TEMOVATE) 0.05 % cream Apply 1 application topically 2 (two) times daily as needed (to rash).       [name] co-enzyme Q-10 30 MG capsule Take 30 mg by mouth daily.      . colchicine 0.6 MG tablet Take 0.6 mg by mouth daily as needed (for gout).       [name] desloratadine (CLARINEX) 5 MG tablet Take 5 mg by mouth daily.      [name] esomeprazole (NEXIUM) 40 MG capsule Take 40 mg by mouth daily as needed (for heartburn).      . fluticasone (CUTIVATE) 0.05 % cream Apply 1 application topically 2 (two) times daily as needed (to rash).      . furosemide (LASIX) 20 MG tablet Take 1 tablet (20 mg total) by mouth  daily as needed.  30 tablet  2  . hydroxychloroquine (PLAQUENIL) 200 MG tablet Take 400 mg by mouth 2 (two) times daily.       . Lancets MISC by Does not apply route. Use with Freestyle Lite glucometer      . levothyroxine (SYNTHROID, LEVOTHROID) 100 MCG tablet Take 100 mcg by mouth daily.        . meloxicam (MOBIC) 7.5 MG tablet Take 7.5 mg by mouth 2 (two) times daily.      . nitroGLYCERIN (NITROSTAT) 0.4 MG SL tablet Place 1 tablet (0.4 mg total) under the tongue every 5 (five) minutes as needed for chest pain.  25 tablet  3  . rosuvastatin (CRESTOR) 5 MG tablet Take 1 tablet (5 mg total) by mouth as needed.  30 tablet  11  . vitamin B-12 (CYANOCOBALAMIN) 1000 MCG tablet Take 2,000 mcg by mouth daily.       No current facility-administered medications on file prior to visit.    No Known Allergies  Past Medical History  Diagnosis Date  . Arthritis   . Hypertension   .  Hyperlipidemia   . Gout   . Obesity   . Hot flashes   . 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 OA (osteoarthritis)   . Hypothyroidism   . Normal nuclear stress test 2004    Past Surgical History  Procedure Laterality Date  . Spine surgery  08/2008    T2  . Cholecystectomy  1987  . US echocardiography  07/25/2004    EF 55-60%  . Cardiovascular stress test  08/20/2002    EF 76%  . Tubal ligation  1975  . Hand surgery  1987 and 1988    bilateral  . Dilation and curettage of uterus  multiple    History  Smoking status  . Never Smoker   Smokeless tobacco  . Never Used    History  Alcohol Use No    Family History  Problem Relation Age of Onset  . Heart failure Mother   . Hypertension Father   . Stroke Father   . Diabetes Father     Reviw of Systems:  Reviewed in the HPI.  All other systems are negative.  Physical Exam: Blood pressure 143/83, pulse 72, height 5\' 10"  (1.778 m), weight 207 lb 4 oz (94.008 kg). General: Well developed, well nourished, in no  acute distress.  Head: Normocephalic, atraumatic, sclera non-icteric, mucus membranes are moist,   Neck: Supple. Carotids are 2 + without bruits. No JVD  Lungs: Clear bilaterally to auscultation.  Heart: regular rate.  normal  S1 S2. No murmurs, gallops or rubs.  Abdomen: Soft, non-tender, non-distended with normal bowel sounds. No hepatomegaly. No rebound/guarding. No masses.  Msk:  Strength and tone are normal  Extremities: No clubbing or cyanosis. No edema.  Distal pedal pulses are 2+ and equal bilaterally.  Neuro: Alert and oriented X 3. Moves all extremities spontaneously.  Psych:  Responds to questions appropriately with a normal affect.  ECG: August 27, 2011. [employer] [name]. NS ST/T abn. Rate of 77.  Assessment / Plan:

## 2013-04-15 NOTE — Assessment & Plan Note (Signed)
Her blood pressure remains mildly elevated. She admits to eating some extra salt. She goes out to eat often.  At this point we will stop the Lasix and start her on hydrochlorothiazide 25 mg a day. She will continue potassium chloride 20 mEq a day. We'll check a basic metabolic profile in 3 weeks.

## 2013-04-15 NOTE — Patient Instructions (Signed)
TSH today.  Discontinue Lasix Add HCTZ 25 mg daily.  BMP in 3 weeks.  Your physician wants you to follow-up in: 6 months.  You will receive a reminder letter in the mail two months in advance. If you don't receive a letter, please call our office to schedule the follow-up appointment.   Your physician recommends that you return for fasting lab work in: 6 months.

## 2013-04-15 NOTE — Assessment & Plan Note (Signed)
She's had lots of episodes of chest pain. She's had a very thorough workup. She's had an MRI which did not reveal any evidence of myocarditis. She was admitted to the hospital several weeks ago and ruled out for myocardial infarction. She's had a stress Myoview study which was completely normal.  She has been found to have mild to moderate aortic insufficiency, mitral regurgitation, tricuspid regurgitation. I've reassured her that these are fairly benign conditions. We will anticipate getting an echocardiogram in the year so. At present, these mild valvular abnormalities are not causing any trouble and I do not think she needs any additional workup at this time.

## 2013-04-16 LAB — TSH: TSH: 1.59 u[IU]/mL (ref 0.450–4.500)

## 2013-04-19 ENCOUNTER — Telehealth: Payer: Self-pay

## 2013-04-19 NOTE — Telephone Encounter (Signed)
Left detailed message on pt's voicemail with results.  Asked pt to call with any questions or concerns.

## 2013-04-19 NOTE — Telephone Encounter (Signed)
Message copied by Marilynne Halsted on Mon Apr 19, 2013 11:26 AM ------      Message from: Vesta Mixer      Created: Fri Apr 16, 2013  5:38 PM       TSH is ok       ------

## 2013-05-11 ENCOUNTER — Other Ambulatory Visit (INDEPENDENT_AMBULATORY_CARE_PROVIDER_SITE_OTHER): Payer: BC Managed Care – PPO

## 2013-05-11 ENCOUNTER — Ambulatory Visit: Payer: BC Managed Care – PPO | Admitting: Cardiovascular Disease

## 2013-05-11 DIAGNOSIS — I10 Essential (primary) hypertension: Secondary | ICD-10-CM

## 2013-05-11 DIAGNOSIS — R079 Chest pain, unspecified: Secondary | ICD-10-CM

## 2013-05-11 DIAGNOSIS — R0789 Other chest pain: Secondary | ICD-10-CM

## 2013-05-11 LAB — BASIC METABOLIC PANEL
BUN: 16 mg/dL (ref 6–23)
Calcium: 9 mg/dL (ref 8.4–10.5)
Creatinine, Ser: 0.9 mg/dL (ref 0.4–1.2)
GFR: 68.87 mL/min (ref >60.00)
Potassium: 3.8 mEq/L (ref 3.5–5.1)
Sodium: 137 mEq/L (ref 135–145)

## 2013-09-27 ENCOUNTER — Other Ambulatory Visit: Payer: Self-pay | Admitting: Cardiovascular Disease

## 2013-10-01 ENCOUNTER — Telehealth: Payer: Self-pay

## 2013-10-01 NOTE — Telephone Encounter (Signed)
received call for pt eye doctor pt was dx with glaucoma and Dr.Miller would like to start her on a beta blocker eye drop betamol and would like Dr.Nahser Ok.

## 2013-10-04 ENCOUNTER — Encounter: Payer: Self-pay | Admitting: Cardiovascular Disease

## 2013-10-04 ENCOUNTER — Ambulatory Visit (INDEPENDENT_AMBULATORY_CARE_PROVIDER_SITE_OTHER): Payer: BC Managed Care – PPO | Admitting: Cardiovascular Disease

## 2013-10-04 VITALS — BP 112/84 | HR 66 | Ht 70.0 in | Wt 203.8 lb

## 2013-10-04 DIAGNOSIS — I10 Essential (primary) hypertension: Secondary | ICD-10-CM

## 2013-10-04 DIAGNOSIS — E785 Hyperlipidemia, unspecified: Secondary | ICD-10-CM

## 2013-10-04 DIAGNOSIS — R072 Precordial pain: Secondary | ICD-10-CM

## 2013-10-04 DIAGNOSIS — R079 Chest pain, unspecified: Secondary | ICD-10-CM

## 2013-10-04 DIAGNOSIS — R0789 Other chest pain: Secondary | ICD-10-CM

## 2013-10-04 MED ORDER — ROSUVASTATIN CALCIUM 5 MG PO TABS: ORAL_TABLET | ORAL | Status: DC

## 2013-10-04 MED ORDER — CARVEDILOL 6.25 MG PO TABS: ORAL_TABLET | ORAL | Status: DC

## 2013-10-04 MED ORDER — HYDROCHLOROTHIAZIDE 25 MG PO TABS: 25.0000 mg | ORAL_TABLET | Freq: Every day | ORAL | Status: DC

## 2013-10-04 NOTE — Patient Instructions (Signed)
Your physician has recommended you make the following change in your medication:  Stop Lasix   Your HCTZ, Coreg and Crestor have been refilled  Your physician recommends that you have labs today: BMP  Lipid  Liver   Your physician wants you to follow-up in: 1 year. You will receive a reminder letter in the mail two months in advance. If you don't receive a letter, please call our office to schedule the follow-up appointment.

## 2013-10-04 NOTE — Assessment & Plan Note (Signed)
She is doing well on the Crestor.  Continue current dose.  Will check lipids, liver, BMP in 1 year when I see her for OV

## 2013-10-04 NOTE — Assessment & Plan Note (Signed)
She is doing well.  Continue current meds.  She is tolerating the Coreg very well.

## 2013-10-04 NOTE — Progress Notes (Signed)
Sydney Wolfe Date of Birth  11-15-1950 [facility]     [place] [facility]  [address]    [address]   [address] [place], [place]  [address]    [place], [place]  [address] [phone]  Fax  [phone]  [phone]  Fax [phone]  Problems: 1. Hypertension 2. Hyperlipidemia 3. Mild obesity.  History of Present Illness:  Sydney Wolfe is a 63 y.o. female with the above noted hx.  She has done very well. She has not had any episodes of chest pain or shortness breath. She has been exercising on regular basis.  November 17, 2012:  She is tolerating the coreg well but has noticed some photosensitivity ( listed by epocrates also as a side effect)  Nov. 20, 2014:  Morning was admitted to the hosptial with CP several weeks ago.  She rule out of MI,  MRI of the heart was normal.  Stress myoview 04/13/13 was normal.   She had continued to have fatigue, variable BP.   She also has dyspnea.  Her 2 sisters have cardiac issues Sydney Wolfe)   Oct 04, 2013:  Sydney Wolfe is doing well.  Exercising reguarly.   She is tolerating the coreg.     Current Outpatient Prescriptions on File Prior to Visit  Medication Sig Dispense Refill  . allopurinol (ZYLOPRIM) 300 MG tablet Take 300 mg by mouth daily as needed (for gout).       [name] aspirin 81 MG tablet Take 81 mg by mouth daily.        . carvedilol (COREG) 6.25 MG tablet TAKE 1 TABLET BY MOUTH TWICE A DAY  60 tablet  5  . clobetasol (TEMOVATE) 0.05 % cream Apply 1 application topically 2 (two) times daily as needed (to rash).       [name] co-enzyme Q-10 30 MG capsule Take 30 mg by mouth daily.      . colchicine 0.6 MG tablet Take 0.6 mg by mouth daily as needed (for gout).       [name] desloratadine (CLARINEX) 5 MG tablet Take 5 mg by mouth daily.      [name] esomeprazole (NEXIUM) 40 MG capsule Take 40 mg by mouth daily as needed (for heartburn).      . furosemide (LASIX) 20 MG tablet Take 1 tablet (20 mg total) by mouth daily as needed.  30  tablet  2  . hydrochlorothiazide (HYDRODIURIL) 25 MG tablet Take 1 tablet (25 mg total) by mouth daily.  90 tablet  3  . hydroxychloroquine (PLAQUENIL) 200 MG tablet Take 400 mg by mouth daily.       . Lancets MISC by Does not apply route. Use with Freestyle Lite glucometer      . levothyroxine (SYNTHROID, LEVOTHROID) 100 MCG tablet Take 100 mcg by mouth daily.        . meloxicam (MOBIC) 7.5 MG tablet Take 7.5 mg by mouth 2 (two) times daily.      . nitroGLYCERIN (NITROSTAT) 0.4 MG SL tablet Place 1 tablet (0.4 mg total) under the tongue every 5 (five) minutes as needed for chest pain.  25 tablet  3  . vitamin B-12 (CYANOCOBALAMIN) 1000 MCG tablet Take 2,000 mcg by mouth daily.       No current facility-administered medications on file prior to visit.    No Known Allergies  Past Medical History  Diagnosis Date  . Arthritis   . Hypertension   . Hyperlipidemia   . Gout   .  Obesity   . Hot flashes   . 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 OA (osteoarthritis)   . Hypothyroidism   . Normal nuclear stress test 2004    Past Surgical History  Procedure Laterality Date  . Spine surgery  08/2008    T2  . Cholecystectomy  1987  . US echocardiography  07/25/2004    EF 55-60%  . Cardiovascular stress test  08/20/2002    EF 76%  . Tubal ligation  1975  . Hand surgery  1987 and 1988    bilateral  . Dilation and curettage of uterus  multiple    History  Smoking status  . Never Smoker   Smokeless tobacco  . Never Used    History  Alcohol Use No    Family History  Problem Relation Age of Onset  . Heart failure Mother   . Hypertension Father   . Stroke Father   . Diabetes Father     Reviw of Systems:  Reviewed in the HPI.  All other systems are negative.  Physical Exam: Blood pressure 112/84, pulse 66, height 5\' 10"  (1.778 m), weight 203 lb 12 oz (92.42 kg). General: Well developed, well nourished, in no acute distress.  Head:  Normocephalic, atraumatic, sclera non-icteric, mucus membranes are moist,   Neck: Supple. Carotids are 2 + without bruits. No JVD  Lungs: Clear bilaterally to auscultation.  Heart: regular rate.  normal  S1 S2. No murmurs, gallops or rubs.  Abdomen: Soft, non-tender, non-distended with normal bowel sounds. No hepatomegaly. No rebound/guarding. No masses.  Msk:  Strength and tone are normal  Extremities: No clubbing or cyanosis. No edema.  Distal pedal pulses are 2+ and equal bilaterally.  Neuro: Alert and oriented X 3. Moves all extremities spontaneously.  Psych:  Responds to questions appropriately with a normal affect.  ECG: Oct 04, 2013: Normal sinus rhythm at 66. She has no ST or T wave changes.  Assessment / Plan:

## 2013-10-05 LAB — HEPATIC FUNCTION PANEL
ALT: 37 IU/L — ABNORMAL HIGH (ref 0–32)
AST: 27 IU/L (ref 0–40)
Albumin: 4.1 g/dL (ref 3.6–4.8)
Alkaline Phosphatase: 49 IU/L (ref 39–117)
BILIRUBIN TOTAL: 0.6 mg/dL (ref 0.0–1.2)
Bilirubin, Direct: 0.18 mg/dL (ref 0.00–0.40)
Total Protein: 6.4 g/dL (ref 6.0–8.5)

## 2013-10-05 LAB — BASIC METABOLIC PANEL
BUN / CREAT RATIO: 22 (ref 11–26)
BUN: 17 mg/dL (ref 8–27)
CHLORIDE: 100 mmol/L (ref 97–108)
CO2: 23 mmol/L (ref 18–29)
Calcium: 9 mg/dL (ref 8.7–10.3)
Creatinine, Ser: 0.78 mg/dL (ref 0.57–1.00)
GFR calc Af Amer: 94 mL/min/{1.73_m2} (ref >59)
GFR calc non Af Amer: 81 mL/min/{1.73_m2} (ref >59)
Glucose: 80 mg/dL (ref 65–99)
Potassium: 4 mmol/L (ref 3.5–5.2)
SODIUM: 145 mmol/L — AB (ref 134–144)

## 2013-10-05 LAB — LIPID PANEL
CHOL/HDL RATIO: 2 ratio (ref 0.0–4.4)
Cholesterol, Total: 131 mg/dL (ref 100–199)
HDL: 64 mg/dL (ref >39)
LDL CALC: 47 mg/dL (ref 0–99)
TRIGLYCERIDES: 98 mg/dL (ref 0–149)
VLDL Cholesterol Cal: 20 mg/dL (ref 5–40)

## 2014-03-17 ENCOUNTER — Other Ambulatory Visit: Payer: Self-pay | Admitting: Cardiovascular Disease

## 2014-05-14 ENCOUNTER — Other Ambulatory Visit: Payer: Self-pay | Admitting: Cardiovascular Disease

## 2014-11-24 ENCOUNTER — Ambulatory Visit: Payer: Self-pay | Admitting: Cardiovascular Disease

## 2014-12-15 ENCOUNTER — Encounter: Payer: Self-pay | Admitting: Cardiovascular Disease

## 2014-12-15 ENCOUNTER — Ambulatory Visit (INDEPENDENT_AMBULATORY_CARE_PROVIDER_SITE_OTHER): Payer: Medicare Other | Admitting: Cardiovascular Disease

## 2014-12-15 ENCOUNTER — Other Ambulatory Visit
Admission: RE | Admit: 2014-12-15 | Discharge: 2014-12-15 | Disposition: A | Discharge status: Home / Self Care | Payer: Medicare Other | Source: Ambulatory Visit | Attending: Cardiovascular Disease | Admitting: Cardiovascular Disease

## 2014-12-15 VITALS — BP 120/88 | HR 65 | Ht 70.0 in | Wt 209.5 lb

## 2014-12-15 DIAGNOSIS — I10 Essential (primary) hypertension: Secondary | ICD-10-CM | POA: Diagnosis not present

## 2014-12-15 DIAGNOSIS — E785 Hyperlipidemia, unspecified: Secondary | ICD-10-CM

## 2014-12-15 DIAGNOSIS — I4510 Unspecified right bundle-branch block: Secondary | ICD-10-CM

## 2014-12-15 LAB — BASIC METABOLIC PANEL
Anion gap: 8 (ref 5–15)
BUN: 13 mg/dL (ref 6–20)
CALCIUM: 9.1 mg/dL (ref 8.9–10.3)
CO2: 27 mmol/L (ref 22–32)
CREATININE: 0.74 mg/dL (ref 0.44–1.00)
Chloride: 105 mmol/L (ref 101–111)
GFR calc Af Amer: >60 mL/min (ref >60)
GFR calc non Af Amer: >60 mL/min (ref >60)
Glucose, Bld: 94 mg/dL (ref 65–99)
Potassium: 3.6 mmol/L (ref 3.5–5.1)
Sodium: 140 mmol/L (ref 135–145)

## 2014-12-15 LAB — HEPATIC FUNCTION PANEL
ALT: 32 U/L (ref 14–54)
AST: 33 U/L (ref 15–41)
Albumin: 4.3 g/dL (ref 3.5–5.0)
Alkaline Phosphatase: 52 U/L (ref 38–126)
BILIRUBIN INDIRECT: 0.8 mg/dL (ref 0.3–0.9)
Bilirubin, Direct: 0.1 mg/dL (ref 0.1–0.5)
TOTAL PROTEIN: 7.4 g/dL (ref 6.5–8.1)
Total Bilirubin: 0.9 mg/dL (ref 0.3–1.2)

## 2014-12-15 LAB — LIPID PANEL
Cholesterol: 141 mg/dL (ref 0–200)
HDL: 45 mg/dL (ref >40)
LDL Cholesterol: 65 mg/dL (ref 0–99)
Total CHOL/HDL Ratio: 3.1 RATIO
Triglycerides: 157 mg/dL — ABNORMAL HIGH (ref <150)
VLDL: 31 mg/dL (ref 0–40)

## 2014-12-15 NOTE — Progress Notes (Signed)
Sydney Wolfe Date of Birth  May 29, 1950 [facility]     [place] [facility]  [address]    [address]   [address] [place], [place]  [address]    [place], [place]  [address] [phone]  Fax  [phone]  [phone]  Fax [phone]  Problems: 1. Hypertension 2. Hyperlipidemia 3. Mild obesity. 4.  [name]  History of Present Illness:  Sydney Wolfe is a 64 y.o. female with the above noted hx.  She has done very well. She has not had any episodes of chest pain or shortness breath. She has been exercising on regular basis.  November 17, 2012:  She is tolerating the coreg well but has noticed some photosensitivity ( listed by epocrates also as a side effect)  Nov. 20, 2014:  Sydney Wolfe was admitted to the hosptial with CP several weeks ago.  She rule out of MI,  MRI of the heart was normal.  Stress myoview 04/13/13 was normal.   She had continued to have fatigue, variable BP.   She also has dyspnea.  Her 2 sisters have cardiac issues Sydney Wolfe)   Oct 04, 2013:  Sydney Wolfe is doing well.  Exercising reguarly.   She is tolerating the coreg.    December 15, 2014:     Current Outpatient Prescriptions on File Prior to Visit  Medication Sig Dispense Refill  . aspirin 81 MG tablet Take 81 mg by mouth daily.      . carvedilol (COREG) 6.25 MG tablet TAKE 1 TABLET BY MOUTH TWICE A DAY 60 tablet 6  . co-enzyme Q-10 30 MG capsule Take 30 mg by mouth daily.    . colchicine 0.6 MG tablet Take 0.6 mg by mouth daily as needed (for gout).     . hydrochlorothiazide (HYDRODIURIL) 25 MG tablet TAKE 1 TABLET EVERY DAY 90 tablet 3  . meloxicam (MOBIC) 7.5 MG tablet Take 7.5 mg by mouth 2 (two) times daily.    . nitroGLYCERIN (NITROSTAT) 0.4 MG SL tablet Place 1 tablet (0.4 mg total) under the tongue every 5 (five) minutes as needed for chest pain. 25 tablet 3  . rosuvastatin (CRESTOR) 5 MG tablet Takes 1 tablet daily 30 tablet 6   No current facility-administered medications on  file prior to visit.    No Known Allergies  Past Medical History  Diagnosis Date  . Arthritis   . Hypertension   . Hyperlipidemia   . Gout   . Obesity   . Hot flashes   . 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 OA (osteoarthritis)   . Hypothyroidism   . Normal nuclear stress test 2004    Past Surgical History  Procedure Laterality Date  . Spine surgery  08/2008    T2  . Cholecystectomy  1987  . US echocardiography  07/25/2004    EF 55-60%  . Cardiovascular stress test  08/20/2002    EF 76%  . Tubal ligation  1975  . Hand surgery  1987 and 1988    bilateral  . Dilation and curettage of uterus  multiple    History  Smoking status  . Never Smoker   Smokeless tobacco  . Never Used    History  Alcohol Use No    Family History  Problem Relation Age of Onset  . Heart failure Mother   . Hypertension Father   . Stroke Father   . Diabetes Father  Reviw of Systems:  Reviewed in the HPI.  All other systems are negative.  Physical Exam: Blood pressure 120/88, pulse 65, height 5\' 10"  (1.778 m), weight 95.029 kg (209 lb 8 oz). General: Well developed, well nourished, in no acute distress.  Head: Normocephalic, atraumatic, sclera non-icteric, mucus membranes are moist,   Neck: Supple. Carotids are 2 + without bruits. No JVD  Lungs: Clear bilaterally to auscultation.  Heart: regular rate.  normal  S1 S2. No murmurs, gallops or rubs.  Abdomen: Soft, non-tender, non-distended with normal bowel sounds. No hepatomegaly. No rebound/guarding. No masses.  Msk:  Strength and tone are normal  Extremities: No clubbing or cyanosis. No edema.  Distal pedal pulses are 2+ and equal bilaterally.  Neuro: Alert and oriented X 3. Moves all extremities spontaneously.  Psych:  Responds to questions appropriately with a normal affect.  ECG: December 15, 2014:  NSR [name] with associated ST changes.    Assessment / Plan:   1. Hypertension -  her blood pressure is well-controlled. Continue current medications.  2. Hyperlipidemia her lipids have been well controlled. Will draw fasting lipids, liver enzymes, and basic medical profile today.  3. Mild obesity.  4. [name]:    She has  developed a new right bundle branch block. She's not having any symptoms and at this point I do not think that this needs any further evaluation. I've advised her to let you know she develops any presyncope or syncopal episodes.    Nahser, December 17, 2014, MD  12/15/2014 8:11 AM    [facility] [address] [place],  [address] [place], [place]  [place] Pager [phone] Phone: [phone]; Fax: [phone]   [facility]  [address] [place], [place]  [place] [id number]   Fax [phone]

## 2014-12-15 NOTE — Patient Instructions (Signed)
Medication Instructions:  Your physician recommends that you continue on your current medications as directed. Please refer to the Current Medication list given to you today.   Labwork: Your physician recommends that you have FASTING lipid, liver, BMET profile today    Testing/Procedures: none  Follow-Up: Your physician wants you to follow-up in: one year with Dr. Graciela Husbands.  You will receive a reminder letter in the mail two months in advance. If you don't receive a letter, please call our office to schedule the follow-up appointment.   Any Other Special Instructions Will Be Listed Below (If Applicable).

## 2014-12-19 ENCOUNTER — Other Ambulatory Visit: Payer: Self-pay

## 2014-12-19 MED ORDER — HYDROCHLOROTHIAZIDE 25 MG PO TABS: 25.0000 mg | ORAL_TABLET | Freq: Every day | ORAL | Status: DC

## 2014-12-19 MED ORDER — CARVEDILOL 6.25 MG PO TABS: 6.2500 mg | ORAL_TABLET | Freq: Two times a day (BID) | ORAL | Status: DC

## 2014-12-19 MED ORDER — ROSUVASTATIN CALCIUM 5 MG PO TABS: ORAL_TABLET | ORAL | Status: DC

## 2014-12-20 ENCOUNTER — Other Ambulatory Visit: Payer: Self-pay

## 2014-12-27 ENCOUNTER — Other Ambulatory Visit: Payer: Self-pay

## 2015-08-02 ENCOUNTER — Other Ambulatory Visit: Payer: Self-pay | Admitting: *Deleted

## 2015-08-02 MED ORDER — CARVEDILOL 6.25 MG PO TABS: 6.2500 mg | ORAL_TABLET | Freq: Two times a day (BID) | ORAL | Status: DC

## 2015-12-14 ENCOUNTER — Encounter: Payer: Self-pay | Admitting: Cardiovascular Disease

## 2015-12-14 ENCOUNTER — Ambulatory Visit (INDEPENDENT_AMBULATORY_CARE_PROVIDER_SITE_OTHER): Payer: Medicare Other | Admitting: Cardiovascular Disease

## 2015-12-14 VITALS — BP 126/88 | HR 69 | Ht 70.0 in | Wt 217.1 lb

## 2015-12-14 DIAGNOSIS — I10 Essential (primary) hypertension: Secondary | ICD-10-CM | POA: Diagnosis not present

## 2015-12-14 DIAGNOSIS — R072 Precordial pain: Secondary | ICD-10-CM | POA: Diagnosis not present

## 2015-12-14 DIAGNOSIS — E785 Hyperlipidemia, unspecified: Secondary | ICD-10-CM

## 2015-12-14 DIAGNOSIS — R0789 Other chest pain: Secondary | ICD-10-CM | POA: Diagnosis not present

## 2015-12-14 LAB — COMPREHENSIVE METABOLIC PANEL
ALBUMIN: 3.8 g/dL (ref 3.6–5.1)
ALK PHOS: 53 U/L (ref 33–130)
ALT: 22 U/L (ref 6–29)
AST: 25 U/L (ref 10–35)
BUN: 12 mg/dL (ref 7–25)
CALCIUM: 8.8 mg/dL (ref 8.6–10.4)
CO2: 23 mmol/L (ref 20–31)
CREATININE: 0.8 mg/dL (ref 0.50–0.99)
Chloride: 103 mmol/L (ref 98–110)
Glucose, Bld: 90 mg/dL (ref 65–99)
POTASSIUM: 3.8 mmol/L (ref 3.5–5.3)
Sodium: 138 mmol/L (ref 135–146)
Total Bilirubin: 0.6 mg/dL (ref 0.2–1.2)
Total Protein: 7 g/dL (ref 6.1–8.1)

## 2015-12-14 LAB — LIPID PANEL
CHOL/HDL RATIO: 3.1 ratio (ref <=5.0)
Cholesterol: 148 mg/dL (ref 125–200)
HDL: 47 mg/dL (ref >=46)
LDL CALC: 71 mg/dL (ref <130)
Triglycerides: 152 mg/dL — ABNORMAL HIGH (ref <150)
VLDL: 30 mg/dL (ref <30)

## 2015-12-14 NOTE — Patient Instructions (Addendum)
Medication Instructions:  Your physician recommends that you continue on your current medications as directed. Please refer to the Current Medication list given to you today.   Labwork: TODAY - complete metabolic panel, cholesterol   Testing/Procedures: Your physician has requested that you have an echocardiogram. Echocardiography is a painless test that uses sound waves to create images of your heart. It provides your doctor with information about the size and shape of your heart and how well your heart's chambers and valves are working. This procedure takes approximately one hour. There are no restrictions for this procedure.  Your physician has requested that you have a lexiscan myoview. For further information please visit [email or web address] Please follow instruction sheet, as given.   Follow-Up: Your physician recommends that you return for a follow-up appointment on Tuesday October 24 at 9:45 am with Dr. Elease Hashimoto   If you need a refill on your cardiac medications before your next appointment, please call your pharmacy.   Thank you for choosing [facility]! Eligha Bridegroom, RN [phone]

## 2015-12-14 NOTE — Progress Notes (Signed)
Sydney Wolfe Date of Birth  19-Jun-1950 [facility]     [place] [facility]  [address]    [address]   [address] [place], [place]  [address]    [place], [place]  [address] [phone]  Fax  [phone]  [phone]  Fax [phone]  Problems: 1. Hypertension 2. Hyperlipidemia 3. Mild obesity. 4.  [name] 5. Rheumatoid arthritis  6. Hypothyroidism  History of Present Illness:  Sydney Wolfe is a 65 y.o. female with the above noted hx.  She has done very well. She has not had any episodes of chest pain or shortness breath. She has been exercising on regular basis.  November 17, 2012:  She is tolerating the coreg well but has noticed some photosensitivity ( listed by epocrates also as a side effect)  Nov. 20, 2014:  Sydney Wolfe was admitted to the hosptial with CP several weeks ago.  She rule out of MI,  MRI of the heart was normal.  Stress myoview 04/13/13 was normal.   She had continued to have fatigue, variable BP.   She also has dyspnea.  Her 2 sisters have cardiac issues [name] Reavis)   Oct 04, 2013:  Sydney Wolfe is doing well.  Exercising reguarly.   She is tolerating the coreg.    December 15, 2014:  December 14, 2015:  Doing well from a cardiac standpoint She's been having more problems with her rheumatoid arthritis. She's now on Remicade infusions and takes naproxen on a regular basis. She's been getting exercise on a regular basis. She rides her stationary bike 4 times a week.  She has some shortness of breath and chest tightness with exertion -particularly when she climbs stairs. Like a weight on her chest ,  Pressure like sensation .  Resolves after she stops for about 30 seconds.    Current Outpatient Prescriptions on File Prior to Visit  Medication Sig Dispense Refill  . aspirin 81 MG tablet Take 81 mg by mouth daily.      [name] BETIMOL 0.5 % ophthalmic solution Apply 1 drop to eye 2 (two) times daily.     . carvedilol (COREG) 6.25 MG tablet Take 1 tablet  (6.25 mg total) by mouth 2 (two) times daily. 60 tablet 4  . hydrochlorothiazide (HYDRODIURIL) 25 MG tablet Take 1 tablet (25 mg total) by mouth daily. 90 tablet 3  . nitroGLYCERIN (NITROSTAT) 0.4 MG SL tablet Place 1 tablet (0.4 mg total) under the tongue every 5 (five) minutes as needed for chest pain. 25 tablet 3  . rosuvastatin (CRESTOR) 5 MG tablet Takes 1 tablet daily 30 tablet 6   No current facility-administered medications on file prior to visit.    No Known Allergies  Past Medical History  Diagnosis Date  . Arthritis   . Hypertension   . Hyperlipidemia   . Gout   . Obesity   . Hot flashes   . 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 OA (osteoarthritis)   . Hypothyroidism   . Normal nuclear stress test 2004    Past Surgical History  Procedure Laterality Date  . Spine surgery  08/2008    T2  . Cholecystectomy  1987  . US echocardiography  07/25/2004    EF 55-60%  . Cardiovascular stress test  08/20/2002    EF 76%  . Tubal ligation  1975  . Hand surgery  1987 and 1988    bilateral  .  Dilation and curettage of uterus  multiple    History  Smoking status  . Never Smoker   Smokeless tobacco  . Never Used    History  Alcohol Use No    Family History  Problem Relation Age of Onset  . Heart failure Mother   . Hypertension Father   . Stroke Father   . Diabetes Father     Reviw of Systems:  Reviewed in the HPI.  All other systems are negative.  Physical Exam: Blood pressure 126/88, pulse 69, height 5\' 10"  (1.778 m), weight 217 lb 1.9 oz (98.485 kg), SpO2 96 %. General: Well developed, well nourished, in no acute distress.  Head: Normocephalic, atraumatic, sclera non-icteric, mucus membranes are moist,   Neck: Supple. Carotids are 2 + without bruits. No JVD  Lungs: Clear bilaterally to auscultation.  Heart: regular rate.  normal  S1 S2. No murmurs, gallops or rubs.  Abdomen: Soft, non-tender, non-distended with  normal bowel sounds. No hepatomegaly. No rebound/guarding. No masses.  Msk:  Strength and tone are normal  Extremities: No clubbing or cyanosis. No edema.  Distal pedal pulses are 2+ and equal bilaterally.  Neuro: Alert and oriented X 3. Moves all extremities spontaneously.  Psych:  Responds to questions appropriately with a normal affect.  ECG: December 14, 2015:  NSR at 58.  [name]   Assessment / Plan:   1. Chest pain :   Has episodes of chest tightness particularly when she climbs up stairs. These last for about a minute or so. They typically stop when she stops to rest. They are so should with shortness of breath. She denies any diaphoresis. She denies syncope or presyncope.  We'll schedule her for a 78 study for further evaluation.  She had a stress Myoview study in 2014 which was normal. Her current symptoms are completely different than her previous symptoms in 2014.  2. Hypertension - her blood pressure is well-controlled. Continue current medications.  3. Hyperlipidemia her lipids have been well controlled. Will draw fasting lipids, liver enzymes, and basic medical profile today.  4. Mild obesity.  5. [name]:        2015, MD  12/14/2015 10:34 AM    [facility] [address] [place],  [address] [place], [place]  [place] Pager [phone] Phone: [phone]; Fax: [phone]

## 2015-12-18 ENCOUNTER — Telehealth (HOSPITAL_COMMUNITY): Payer: Self-pay | Admitting: *Deleted

## 2015-12-18 NOTE — Telephone Encounter (Signed)
Left message on voicemail per DPR in reference to upcoming appointment scheduled on 12/20/15 at 0730 with detailed instructions given per Myocardial Perfusion Study Information Sheet for the test. LM to arrive 15 minutes early, and that it is imperative to arrive on time for appointment to keep from having the test rescheduled. If you need to cancel or reschedule your appointment, please call the office within 24 hours of your appointment. Failure to do so may result in a cancellation of your appointment, and a $50 no show fee. Phone number given for call back for any questions. Briyah Wheelwright, Adelene Idler

## 2015-12-20 ENCOUNTER — Ambulatory Visit (HOSPITAL_COMMUNITY): Payer: Medicare Other | Attending: Cardiovascular Disease

## 2015-12-20 DIAGNOSIS — R0789 Other chest pain: Secondary | ICD-10-CM | POA: Diagnosis present

## 2015-12-20 DIAGNOSIS — R0609 Other forms of dyspnea: Secondary | ICD-10-CM | POA: Diagnosis not present

## 2015-12-20 DIAGNOSIS — I4510 Unspecified right bundle-branch block: Secondary | ICD-10-CM | POA: Insufficient documentation

## 2015-12-20 DIAGNOSIS — I10 Essential (primary) hypertension: Secondary | ICD-10-CM

## 2015-12-20 DIAGNOSIS — R9439 Abnormal result of other cardiovascular function study: Secondary | ICD-10-CM | POA: Diagnosis not present

## 2015-12-20 LAB — MYOCARDIAL PERFUSION IMAGING
CHL CUP NUCLEAR SDS: 0
CHL CUP NUCLEAR SRS: 2
CHL CUP NUCLEAR SSS: 2
LHR: 0.3
LV sys vol: 20 mL
LVDIAVOL: 77 mL (ref 46–106)
NUC STRESS TID: 1.13
Peak HR: 90 {beats}/min
Rest HR: 68 {beats}/min

## 2015-12-20 MED ORDER — REGADENOSON 0.4 MG/5ML IV SOLN
0.4000 mg | Freq: Once | INTRAVENOUS | Status: AC
  Administered 2015-12-20: 0.4 mg via INTRAVENOUS

## 2015-12-20 MED ORDER — TECHNETIUM TC 99M TETROFOSMIN IV KIT
32.9000 | PACK | Freq: Once | INTRAVENOUS | Status: AC | PRN
  Administered 2015-12-20: 32.9 via INTRAVENOUS
  Filled 2015-12-20: qty 33

## 2015-12-20 MED ORDER — TECHNETIUM TC 99M TETROFOSMIN IV KIT
10.2000 | PACK | Freq: Once | INTRAVENOUS | Status: AC | PRN
  Administered 2015-12-20: 10 via INTRAVENOUS
  Filled 2015-12-20: qty 10

## 2015-12-22 ENCOUNTER — Other Ambulatory Visit: Payer: Self-pay | Admitting: Cardiovascular Disease

## 2015-12-28 ENCOUNTER — Other Ambulatory Visit: Payer: Self-pay

## 2015-12-28 ENCOUNTER — Ambulatory Visit (HOSPITAL_COMMUNITY): Payer: Medicare Other | Attending: Cardiology

## 2015-12-28 DIAGNOSIS — Z6831 Body mass index (BMI) 31.0-31.9, adult: Secondary | ICD-10-CM | POA: Diagnosis not present

## 2015-12-28 DIAGNOSIS — I340 Nonrheumatic mitral (valve) insufficiency: Secondary | ICD-10-CM | POA: Diagnosis not present

## 2015-12-28 DIAGNOSIS — E669 Obesity, unspecified: Secondary | ICD-10-CM | POA: Diagnosis not present

## 2015-12-28 DIAGNOSIS — Z8249 Family history of ischemic heart disease and other diseases of the circulatory system: Secondary | ICD-10-CM | POA: Insufficient documentation

## 2015-12-28 DIAGNOSIS — I10 Essential (primary) hypertension: Secondary | ICD-10-CM | POA: Diagnosis not present

## 2015-12-28 DIAGNOSIS — I119 Hypertensive heart disease without heart failure: Secondary | ICD-10-CM | POA: Insufficient documentation

## 2015-12-28 DIAGNOSIS — I351 Nonrheumatic aortic (valve) insufficiency: Secondary | ICD-10-CM | POA: Insufficient documentation

## 2015-12-28 DIAGNOSIS — I77810 Thoracic aortic ectasia: Secondary | ICD-10-CM | POA: Diagnosis not present

## 2015-12-28 DIAGNOSIS — R0789 Other chest pain: Secondary | ICD-10-CM | POA: Insufficient documentation

## 2015-12-28 DIAGNOSIS — E785 Hyperlipidemia, unspecified: Secondary | ICD-10-CM | POA: Insufficient documentation

## 2015-12-28 DIAGNOSIS — R079 Chest pain, unspecified: Secondary | ICD-10-CM | POA: Diagnosis present

## 2015-12-28 DIAGNOSIS — I4510 Unspecified right bundle-branch block: Secondary | ICD-10-CM | POA: Insufficient documentation

## 2016-01-04 ENCOUNTER — Other Ambulatory Visit: Payer: Self-pay | Admitting: *Deleted

## 2016-01-04 MED ORDER — HYDROCHLOROTHIAZIDE 25 MG PO TABS: 25.0000 mg | ORAL_TABLET | Freq: Every day | ORAL | 3 refills | Status: DC

## 2016-01-22 ENCOUNTER — Other Ambulatory Visit: Payer: Self-pay | Admitting: *Deleted

## 2016-01-22 MED ORDER — ROSUVASTATIN CALCIUM 5 MG PO TABS: ORAL_TABLET | ORAL | 2 refills | Status: DC

## 2016-01-26 DIAGNOSIS — I480 Paroxysmal atrial fibrillation: Secondary | ICD-10-CM

## 2016-01-26 HISTORY — DX: Paroxysmal atrial fibrillation: I48.0

## 2016-02-09 ENCOUNTER — Encounter (HOSPITAL_COMMUNITY)
Admission: EM | Disposition: A | Discharge status: Home / Self Care | Payer: Self-pay | Source: Home / Self Care | Attending: Cardiovascular Disease

## 2016-02-09 ENCOUNTER — Emergency Department (HOSPITAL_COMMUNITY): Payer: Medicare Other

## 2016-02-09 ENCOUNTER — Telehealth: Payer: Self-pay | Admitting: Cardiovascular Disease

## 2016-02-09 ENCOUNTER — Encounter (HOSPITAL_COMMUNITY): Payer: Self-pay

## 2016-02-09 ENCOUNTER — Inpatient Hospital Stay (HOSPITAL_COMMUNITY)
Admission: EM | Admit: 2016-02-09 | Discharge: 2016-02-11 | DRG: 286 | Disposition: A | Discharge status: Home / Self Care | Payer: Medicare Other | Attending: Cardiovascular Disease | Admitting: Cardiovascular Disease

## 2016-02-09 ENCOUNTER — Observation Stay (HOSPITAL_COMMUNITY): Payer: Medicare Other

## 2016-02-09 DIAGNOSIS — Z7982 Long term (current) use of aspirin: Secondary | ICD-10-CM

## 2016-02-09 DIAGNOSIS — M069 Rheumatoid arthritis, unspecified: Secondary | ICD-10-CM | POA: Diagnosis not present

## 2016-02-09 DIAGNOSIS — I200 Unstable angina: Secondary | ICD-10-CM | POA: Diagnosis not present

## 2016-02-09 DIAGNOSIS — Z8249 Family history of ischemic heart disease and other diseases of the circulatory system: Secondary | ICD-10-CM

## 2016-02-09 DIAGNOSIS — J9601 Acute respiratory failure with hypoxia: Secondary | ICD-10-CM | POA: Diagnosis present

## 2016-02-09 DIAGNOSIS — I2699 Other pulmonary embolism without acute cor pulmonale: Secondary | ICD-10-CM

## 2016-02-09 DIAGNOSIS — N390 Urinary tract infection, site not specified: Secondary | ICD-10-CM | POA: Diagnosis present

## 2016-02-09 DIAGNOSIS — I471 Supraventricular tachycardia: Secondary | ICD-10-CM | POA: Diagnosis present

## 2016-02-09 DIAGNOSIS — D751 Secondary polycythemia: Secondary | ICD-10-CM

## 2016-02-09 DIAGNOSIS — R0600 Dyspnea, unspecified: Secondary | ICD-10-CM | POA: Insufficient documentation

## 2016-02-09 DIAGNOSIS — M542 Cervicalgia: Secondary | ICD-10-CM

## 2016-02-09 DIAGNOSIS — R9431 Abnormal electrocardiogram [ECG] [EKG]: Secondary | ICD-10-CM | POA: Diagnosis not present

## 2016-02-09 DIAGNOSIS — G8929 Other chronic pain: Secondary | ICD-10-CM | POA: Diagnosis present

## 2016-02-09 DIAGNOSIS — J9811 Atelectasis: Secondary | ICD-10-CM | POA: Diagnosis present

## 2016-02-09 DIAGNOSIS — I10 Essential (primary) hypertension: Secondary | ICD-10-CM | POA: Diagnosis present

## 2016-02-09 DIAGNOSIS — R0789 Other chest pain: Secondary | ICD-10-CM | POA: Diagnosis not present

## 2016-02-09 DIAGNOSIS — R0602 Shortness of breath: Secondary | ICD-10-CM

## 2016-02-09 DIAGNOSIS — M4722 Other spondylosis with radiculopathy, cervical region: Secondary | ICD-10-CM | POA: Diagnosis present

## 2016-02-09 DIAGNOSIS — R079 Chest pain, unspecified: Secondary | ICD-10-CM | POA: Diagnosis not present

## 2016-02-09 DIAGNOSIS — R262 Difficulty in walking, not elsewhere classified: Secondary | ICD-10-CM

## 2016-02-09 DIAGNOSIS — R072 Precordial pain: Secondary | ICD-10-CM | POA: Diagnosis not present

## 2016-02-09 DIAGNOSIS — E785 Hyperlipidemia, unspecified: Secondary | ICD-10-CM | POA: Diagnosis present

## 2016-02-09 DIAGNOSIS — E039 Hypothyroidism, unspecified: Secondary | ICD-10-CM | POA: Diagnosis present

## 2016-02-09 DIAGNOSIS — I110 Hypertensive heart disease with heart failure: Secondary | ICD-10-CM | POA: Diagnosis present

## 2016-02-09 DIAGNOSIS — I5030 Unspecified diastolic (congestive) heart failure: Secondary | ICD-10-CM | POA: Diagnosis present

## 2016-02-09 DIAGNOSIS — I4510 Unspecified right bundle-branch block: Secondary | ICD-10-CM | POA: Diagnosis present

## 2016-02-09 DIAGNOSIS — R1310 Dysphagia, unspecified: Secondary | ICD-10-CM

## 2016-02-09 HISTORY — DX: Cardiac murmur, unspecified: R01.1

## 2016-02-09 HISTORY — DX: Unspecified right bundle-branch block: I45.10

## 2016-02-09 HISTORY — PX: CARDIAC CATHETERIZATION: SHX172

## 2016-02-09 LAB — I-STAT CHEM 8, ED
BUN: 12 mg/dL (ref 6–20)
CALCIUM ION: 1.12 mmol/L — AB (ref 1.15–1.40)
CHLORIDE: 99 mmol/L — AB (ref 101–111)
Creatinine, Ser: 0.7 mg/dL (ref 0.44–1.00)
Glucose, Bld: 132 mg/dL — ABNORMAL HIGH (ref 65–99)
HEMATOCRIT: 48 % — AB (ref 36.0–46.0)
Hemoglobin: 16.3 g/dL — ABNORMAL HIGH (ref 12.0–15.0)
POTASSIUM: 3.4 mmol/L — AB (ref 3.5–5.1)
SODIUM: 138 mmol/L (ref 135–145)
TCO2: 28 mmol/L (ref 0–100)

## 2016-02-09 LAB — CBC
HCT: 41.4 % (ref 36.0–46.0)
HEMATOCRIT: 45.9 % (ref 36.0–46.0)
HEMOGLOBIN: 15.4 g/dL — AB (ref 12.0–15.0)
HEMOGLOBIN: 13.5 g/dL (ref 12.0–15.0)
MCH: 32.2 pg (ref 26.0–34.0)
MCH: 31.5 pg (ref 26.0–34.0)
MCHC: 33.6 g/dL (ref 30.0–36.0)
MCHC: 32.6 g/dL (ref 30.0–36.0)
MCV: 96 fL (ref 78.0–100.0)
MCV: 96.5 fL (ref 78.0–100.0)
Platelets: 221 10*3/uL (ref 150–400)
Platelets: 202 10*3/uL (ref 150–400)
RBC: 4.78 MIL/uL (ref 3.87–5.11)
RBC: 4.29 MIL/uL (ref 3.87–5.11)
RDW: 12.5 % (ref 11.5–15.5)
RDW: 12.8 % (ref 11.5–15.5)
WBC: 15.2 10*3/uL — AB (ref 4.0–10.5)
WBC: 13 10*3/uL — ABNORMAL HIGH (ref 4.0–10.5)

## 2016-02-09 LAB — I-STAT TROPONIN, ED: Troponin i, poc: 0 ng/mL (ref 0.00–0.08)

## 2016-02-09 LAB — BASIC METABOLIC PANEL
ANION GAP: 11 (ref 5–15)
BUN: 11 mg/dL (ref 6–20)
CHLORIDE: 99 mmol/L — AB (ref 101–111)
CO2: 26 mmol/L (ref 22–32)
Calcium: 9.5 mg/dL (ref 8.9–10.3)
Creatinine, Ser: 0.8 mg/dL (ref 0.44–1.00)
GFR calc Af Amer: >60 mL/min (ref >60)
Glucose, Bld: 137 mg/dL — ABNORMAL HIGH (ref 65–99)
POTASSIUM: 3.4 mmol/L — AB (ref 3.5–5.1)
SODIUM: 136 mmol/L (ref 135–145)

## 2016-02-09 LAB — CREATININE, SERUM
CREATININE: 0.78 mg/dL (ref 0.44–1.00)
GFR calc Af Amer: >60 mL/min (ref >60)

## 2016-02-09 LAB — TROPONIN I
Troponin I: <0.03 ng/mL (ref <0.03)
Troponin I: <0.03 ng/mL (ref <0.03)

## 2016-02-09 LAB — SEDIMENTATION RATE: SED RATE: 50 mm/h — AB (ref 0–22)

## 2016-02-09 LAB — TSH: TSH: 2.228 u[IU]/mL (ref 0.350–4.500)

## 2016-02-09 LAB — CK: Total CK: 29 U/L — ABNORMAL LOW (ref 38–234)

## 2016-02-09 LAB — D-DIMER, QUANTITATIVE: D-Dimer, Quant: 2.75 ug/mL-FEU — ABNORMAL HIGH (ref 0.00–0.50)

## 2016-02-09 SURGERY — LEFT HEART CATH AND CORONARY ANGIOGRAPHY

## 2016-02-09 MED ORDER — SODIUM CHLORIDE 0.9% FLUSH
3.0000 mL | INTRAVENOUS | Status: DC | PRN
Start: 2016-02-09 — End: 2016-02-11

## 2016-02-09 MED ORDER — ACETAMINOPHEN 325 MG PO TABS
650.0000 mg | ORAL_TABLET | ORAL | Status: DC | PRN
  Administered 2016-02-10 – 2016-02-11 (×4): 650 mg via ORAL
  Filled 2016-02-09 (×4): qty 2

## 2016-02-09 MED ORDER — POTASSIUM CHLORIDE CRYS ER 20 MEQ PO TBCR
40.0000 meq | EXTENDED_RELEASE_TABLET | Freq: Once | ORAL | Status: AC
  Administered 2016-02-09: 40 meq via ORAL
  Filled 2016-02-09: qty 2

## 2016-02-09 MED ORDER — LEVOTHYROXINE SODIUM 50 MCG PO TABS
50.0000 ug | ORAL_TABLET | Freq: Every day | ORAL | Status: DC
  Administered 2016-02-10 – 2016-02-11 (×2): 50 ug via ORAL
  Filled 2016-02-09 (×2): qty 1

## 2016-02-09 MED ORDER — ACETAMINOPHEN 325 MG PO TABS: 650.0000 mg | ORAL_TABLET | ORAL | Status: DC | PRN

## 2016-02-09 MED ORDER — SODIUM CHLORIDE 0.9 % IV SOLN
INTRAVENOUS | Status: AC
  Administered 2016-02-09 – 2016-02-10 (×2): via INTRAVENOUS

## 2016-02-09 MED ORDER — FENTANYL CITRATE (PF) 100 MCG/2ML IJ SOLN
INTRAMUSCULAR | Status: DC | PRN
  Administered 2016-02-09: 25 ug via INTRAVENOUS

## 2016-02-09 MED ORDER — TRAMADOL HCL 50 MG PO TABS: 50.0000 mg | ORAL_TABLET | Freq: Four times a day (QID) | ORAL | Status: DC | PRN

## 2016-02-09 MED ORDER — CARVEDILOL 6.25 MG PO TABS
6.2500 mg | ORAL_TABLET | Freq: Two times a day (BID) | ORAL | Status: DC
  Administered 2016-02-09 – 2016-02-11 (×4): 6.25 mg via ORAL
  Filled 2016-02-09 (×5): qty 1

## 2016-02-09 MED ORDER — ROSUVASTATIN CALCIUM 10 MG PO TABS
20.0000 mg | ORAL_TABLET | Freq: Every day | ORAL | Status: DC
  Administered 2016-02-10 – 2016-02-11 (×2): 20 mg via ORAL
  Filled 2016-02-09 (×2): qty 2

## 2016-02-09 MED ORDER — ASPIRIN 81 MG PO CHEW
324.0000 mg | CHEWABLE_TABLET | Freq: Once | ORAL | Status: AC
  Administered 2016-02-09: 324 mg via ORAL
  Filled 2016-02-09: qty 4

## 2016-02-09 MED ORDER — HEPARIN (PORCINE) IN NACL 2-0.9 UNIT/ML-% IJ SOLN
INTRAMUSCULAR | Status: AC
  Filled 2016-02-09: qty 1500

## 2016-02-09 MED ORDER — IOPAMIDOL (ISOVUE-370) INJECTION 76%
INTRAVENOUS | Status: AC
  Filled 2016-02-09: qty 100

## 2016-02-09 MED ORDER — MORPHINE SULFATE (PF) 2 MG/ML IV SOLN
INTRAVENOUS | Status: AC
  Administered 2016-02-09: 2 mg via INTRAVENOUS
  Filled 2016-02-09: qty 1

## 2016-02-09 MED ORDER — HEPARIN (PORCINE) IN NACL 100-0.45 UNIT/ML-% IJ SOLN
1150.0000 [IU]/h | INTRAMUSCULAR | Status: DC
  Filled 2016-02-09: qty 250

## 2016-02-09 MED ORDER — HEPARIN (PORCINE) IN NACL 100-0.45 UNIT/ML-% IJ SOLN
1450.0000 [IU]/h | INTRAMUSCULAR | Status: DC
  Administered 2016-02-09: 1450 [IU]/h via INTRAVENOUS
  Filled 2016-02-09: qty 250

## 2016-02-09 MED ORDER — FENTANYL CITRATE (PF) 100 MCG/2ML IJ SOLN
INTRAMUSCULAR | Status: AC
  Filled 2016-02-09: qty 2

## 2016-02-09 MED ORDER — IOPAMIDOL (ISOVUE-370) INJECTION 76%
INTRAVENOUS | Status: DC | PRN
  Administered 2016-02-09: 100 mL via INTRA_ARTERIAL

## 2016-02-09 MED ORDER — SODIUM CHLORIDE 0.9 % WEIGHT BASED INFUSION: 1.0000 mL/kg/h | INTRAVENOUS | Status: AC

## 2016-02-09 MED ORDER — NITROGLYCERIN 0.4 MG SL SUBL: 0.4000 mg | SUBLINGUAL_TABLET | SUBLINGUAL | Status: DC | PRN

## 2016-02-09 MED ORDER — MORPHINE SULFATE (PF) 2 MG/ML IV SOLN
2.0000 mg | INTRAVENOUS | Status: DC | PRN
  Administered 2016-02-09: 2 mg via INTRAVENOUS
  Filled 2016-02-09: qty 1

## 2016-02-09 MED ORDER — OXYCODONE-ACETAMINOPHEN 5-325 MG PO TABS
1.0000 | ORAL_TABLET | ORAL | Status: DC | PRN
  Administered 2016-02-10 (×2): 2 via ORAL
  Filled 2016-02-09 (×2): qty 2

## 2016-02-09 MED ORDER — MORPHINE SULFATE (PF) 2 MG/ML IV SOLN: 2.0000 mg | Freq: Four times a day (QID) | INTRAVENOUS | Status: DC | PRN

## 2016-02-09 MED ORDER — LIDOCAINE HCL (PF) 1 % IJ SOLN
INTRAMUSCULAR | Status: DC | PRN
Start: 2016-02-09 — End: 2016-02-09
  Administered 2016-02-09: 2 mL via SUBCUTANEOUS

## 2016-02-09 MED ORDER — FENTANYL CITRATE (PF) 100 MCG/2ML IJ SOLN
50.0000 ug | Freq: Once | INTRAMUSCULAR | Status: AC
  Administered 2016-02-09: 50 ug via INTRAVENOUS
  Filled 2016-02-09: qty 2

## 2016-02-09 MED ORDER — VERAPAMIL HCL 2.5 MG/ML IV SOLN
INTRAVENOUS | Status: AC
  Filled 2016-02-09: qty 2

## 2016-02-09 MED ORDER — ONDANSETRON HCL 4 MG/2ML IJ SOLN: 4.0000 mg | Freq: Four times a day (QID) | INTRAMUSCULAR | Status: DC | PRN

## 2016-02-09 MED ORDER — METHOCARBAMOL 1000 MG/10ML IJ SOLN
500.0000 mg | Freq: Three times a day (TID) | INTRAVENOUS | Status: DC | PRN
  Filled 2016-02-09: qty 5

## 2016-02-09 MED ORDER — TIMOLOL MALEATE 0.5 % OP SOLN
1.0000 [drp] | Freq: Two times a day (BID) | OPHTHALMIC | Status: DC
  Administered 2016-02-09 – 2016-02-11 (×4): 1 [drp] via OPHTHALMIC
  Filled 2016-02-09: qty 5

## 2016-02-09 MED ORDER — HEPARIN SODIUM (PORCINE) 1000 UNIT/ML IJ SOLN
INTRAMUSCULAR | Status: AC
Start: 2016-02-09 — End: 2016-02-09
  Filled 2016-02-09: qty 1

## 2016-02-09 MED ORDER — ANGIOPLASTY BOOK
Freq: Once | Status: DC
Start: 2016-02-09 — End: 2016-02-11
  Filled 2016-02-09: qty 1

## 2016-02-09 MED ORDER — MORPHINE SULFATE (PF) 2 MG/ML IV SOLN
2.0000 mg | INTRAVENOUS | Status: DC | PRN
Start: 2016-02-09 — End: 2016-02-11
  Administered 2016-02-09 (×2): 2 mg via INTRAVENOUS
  Filled 2016-02-09 (×2): qty 1

## 2016-02-09 MED ORDER — IOPAMIDOL (ISOVUE-370) INJECTION 76%
INTRAVENOUS | Status: AC
  Administered 2016-02-09: 18:00:00 100 mL
  Filled 2016-02-09: qty 100

## 2016-02-09 MED ORDER — SODIUM CHLORIDE 0.9 % IV SOLN
INTRAVENOUS | Status: DC | PRN
  Administered 2016-02-09: 91 mL/h via INTRAVENOUS

## 2016-02-09 MED ORDER — MIDAZOLAM HCL 2 MG/2ML IJ SOLN
INTRAMUSCULAR | Status: AC
  Filled 2016-02-09: qty 2

## 2016-02-09 MED ORDER — HEPARIN SODIUM (PORCINE) 5000 UNIT/ML IJ SOLN
INTRAMUSCULAR | Status: AC
  Filled 2016-02-09: qty 1

## 2016-02-09 MED ORDER — MIDAZOLAM HCL 2 MG/2ML IJ SOLN
INTRAMUSCULAR | Status: DC | PRN
  Administered 2016-02-09: 2 mg via INTRAVENOUS

## 2016-02-09 MED ORDER — VERAPAMIL HCL 2.5 MG/ML IV SOLN
INTRAVENOUS | Status: DC | PRN
  Administered 2016-02-09: 10 mL via INTRA_ARTERIAL

## 2016-02-09 MED ORDER — ASPIRIN 81 MG PO CHEW
81.0000 mg | CHEWABLE_TABLET | Freq: Every day | ORAL | Status: DC
  Administered 2016-02-10 – 2016-02-11 (×2): 81 mg via ORAL
  Filled 2016-02-09 (×2): qty 1

## 2016-02-09 MED ORDER — HEPARIN SODIUM (PORCINE) 1000 UNIT/ML IJ SOLN
INTRAMUSCULAR | Status: DC | PRN
  Administered 2016-02-09: 3000 [IU] via INTRAVENOUS

## 2016-02-09 MED ORDER — ONDANSETRON HCL 4 MG/2ML IJ SOLN
4.0000 mg | Freq: Four times a day (QID) | INTRAMUSCULAR | Status: DC | PRN
  Administered 2016-02-10: 4 mg via INTRAVENOUS
  Filled 2016-02-09: qty 2

## 2016-02-09 MED ORDER — HEPARIN BOLUS VIA INFUSION
4000.0000 [IU] | Freq: Once | INTRAVENOUS | Status: AC
  Administered 2016-02-09: 4000 [IU] via INTRAVENOUS
  Filled 2016-02-09: qty 4000

## 2016-02-09 MED ORDER — SODIUM CHLORIDE 0.9% FLUSH
3.0000 mL | Freq: Two times a day (BID) | INTRAVENOUS | Status: DC
  Administered 2016-02-10 – 2016-02-11 (×2): 3 mL via INTRAVENOUS

## 2016-02-09 MED ORDER — HEPARIN SODIUM (PORCINE) 5000 UNIT/ML IJ SOLN: 5000.0000 [IU] | Freq: Three times a day (TID) | INTRAMUSCULAR | Status: DC

## 2016-02-09 MED ORDER — LIDOCAINE HCL (PF) 1 % IJ SOLN
INTRAMUSCULAR | Status: AC
  Filled 2016-02-09: qty 30

## 2016-02-09 MED ORDER — HEPARIN (PORCINE) IN NACL 2-0.9 UNIT/ML-% IJ SOLN
INTRAMUSCULAR | Status: DC | PRN
  Administered 2016-02-09: 1500 mL

## 2016-02-09 MED ORDER — SODIUM CHLORIDE 0.9 % IV SOLN
250.0000 mL | INTRAVENOUS | Status: DC | PRN
  Administered 2016-02-10: 10 mL via INTRAVENOUS

## 2016-02-09 SURGICAL SUPPLY — 13 items
CATH INFINITI 5 FR 3DRC (CATHETERS) ×2 IMPLANT
CATH INFINITI 5 FR JL3.5 (CATHETERS) ×2 IMPLANT
CATH INFINITI 5FR ANG PIGTAIL (CATHETERS) ×2 IMPLANT
CATH INFINITI JR4 5F (CATHETERS) ×2 IMPLANT
DEVICE RAD COMP TR BAND LRG (VASCULAR PRODUCTS) ×2 IMPLANT
GLIDESHEATH SLEND SS 6F .021 (SHEATH) ×2 IMPLANT
KIT HEART LEFT (KITS) ×3 IMPLANT
PACK CARDIAC CATHETERIZATION (CUSTOM PROCEDURE TRAY) ×3 IMPLANT
SYR MEDRAD MARK V 150ML (SYRINGE) ×3 IMPLANT
TRANSDUCER W/STOPCOCK (MISCELLANEOUS) ×3 IMPLANT
TUBING CIL FLEX 10 FLL-RA (TUBING) ×3 IMPLANT
WIRE HI TORQ VERSACORE-J 145CM (WIRE) ×2 IMPLANT
WIRE SAFE-T 1.5MM-J .035X260CM (WIRE) ×2 IMPLANT

## 2016-02-09 NOTE — ED Triage Notes (Signed)
Per Pt, Pt is coming from home with complaints on mid-center chest pain that started this morning after she woke up. Pt reports heaviness to the left arm and jaw pain. Reports taking one Nitro with no relief.

## 2016-02-09 NOTE — Progress Notes (Signed)
ANTICOAGULATION CONSULT NOTE - Initial Consult  Pharmacy Consult for heparin Indication: chest pain/ACS  No Known Allergies  Patient Measurements: Height: 5\' 10"  (177.8 cm) Weight: 200 lb (90.7 kg) IBW/kg (Calculated) : 68.5 Heparin Dosing Weight: 87.2kg  Vital Signs: Temp: 97.9 F (36.6 C) (09/15 1107) Temp Source: Oral (09/15 1107) BP: 143/76 (09/15 1127) Pulse Rate: 105 (09/15 1127)  Labs:  Recent Labs  02/09/16 1133  HGB 16.3*  HCT 48.0*  CREATININE 0.70    Estimated Creatinine Clearance: 84.5 mL/min (by C-G formula based on SCr of 0.7 mg/dL).   Medical History: Past Medical History:  Diagnosis Date  . Arthritis   . Gout   . 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 Hot flashes   . Hyperlipidemia   . Hypertension   . Hypothyroidism   . Normal nuclear stress test 2004  . OA (osteoarthritis)   . Obesity     Assessment: 36 yof presenting with CP. Pharmacy consulted to dose heparin for ACS. Originally called Code STEMI but cancelled. Not on anticoagulation PTA. Hg ok, no bleed documented.  Goal of Therapy:  Heparin level 0.3-0.7 units/ml Monitor platelets by anticoagulation protocol: Yes   Plan:  Heparin 4000 unit bolus Start heparin at 1150 units/h 6h heparin level Daily heparin level/CBC Monitor s/sx bleeding F/u Cards plans  71, PharmD, BCPS Clinical Pharmacist 02/09/2016 11:46 AM

## 2016-02-09 NOTE — ED Provider Notes (Signed)
Emergency Department Provider Note   I have reviewed the triage vital signs and the nursing notes.   HISTORY  Chief Complaint Chest Pain   HPI Sydney Wolfe is a 65 y.o. female with PMH of HTN, HLD, and arthritis presents to the emergency department for evaluation of sudden onset chest pain. Patient reports symptom onset approximate 5 AM. She states it feels like tightness or heaviness in her chest. The pain is nonradiating. She has no history of prior heart attack or similar chest pain. She denies history of DVT/PE. She reports some mild associated dyspnea and nausea. Denies diaphoresis. She reports being in her normal state of health prior to this morning. She does have chronic pain issues related to arthritis.   Past Medical History:  Diagnosis Date  . Arthritis   . Gout   . 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 Hot flashes   . Hyperlipidemia   . Hypertension   . Hypothyroidism   . Normal nuclear stress test 2004  . OA (osteoarthritis)   . Obesity     Patient Active Problem List   Diagnosis Date Noted  . [name] 12/15/2014  . Chest pain, mid sternal 03/25/2013  . Lymphadenopathy-left axilla 04/13/2012  . HTN (hypertension) 02/06/2011  . Hypothyroidism 02/06/2011  . Hyperlipidemia 02/06/2011    Past Surgical History:  Procedure Laterality Date  . CARDIOVASCULAR STRESS TEST  08/20/2002   EF 76%  . CHOLECYSTECTOMY  1987  . DILATION AND CURETTAGE OF UTERUS  multiple  . HAND SURGERY  1987 and 1988   bilateral  . SPINE SURGERY  08/2008   T2  . TUBAL LIGATION  1975  . US ECHOCARDIOGRAPHY  07/25/2004   EF 55-60%    Current Outpatient Rx  . Order #: [id number] Class: Historical Med  . Order #: [id number] Class: Historical Med  . Order #: [id number] Class: Normal  . Order #: [id number] Class: Normal  . Order #: [id number] Class: Historical Med  . Order #: [id number] Class: Historical Med  . Order #: [id number] Class: Historical Med  . Order  #: [id number] Class: Normal  . Order #: [id number] Class: Normal    Allergies Review of patient's allergies indicates no known allergies.  Family History  Problem Relation Age of Onset  . Heart failure Mother   . Hypertension Father   . Stroke Father   . Diabetes Father     Social History Social History  Substance Use Topics  . Smoking status: Never Smoker  . Smokeless tobacco: Never Used  . Alcohol use No    Review of Systems  Constitutional: No fever/chills Eyes: No visual changes. ENT: No sore throat. Cardiovascular: Positive chest pain. Respiratory: Positive shortness of breath. Gastrointestinal: No abdominal pain. Positive nausea, no vomiting.  No diarrhea.  No constipation. Genitourinary: Negative for dysuria. Musculoskeletal: Negative for back pain. Skin: Negative for rash. Neurological: Negative for headaches, focal weakness or numbness.  10-point ROS otherwise negative.  ____________________________________________   PHYSICAL EXAM:  VITAL SIGNS: ED Triage Vitals  Enc Vitals Group     BP 02/09/16 1107 136/73     Pulse Rate 02/09/16 1107 106     Resp 02/09/16 1107 16     Temp 02/09/16 1107 97.9 F (36.6 C)     Temp Source 02/09/16 1107 Oral     SpO2 02/09/16 1107 97 %     Weight 02/09/16 1106 200 lb (90.7 kg)     Height 02/09/16 1106 5\' 10"  (1.778 m)  Pain Score 02/09/16 1107 8   Constitutional: Alert but appears to be in acute distress and in significant pain.  Eyes: Conjunctivae are normal.  Head: Atraumatic. Nose: No congestion/rhinnorhea. Mouth/Throat: Mucous membranes are moist.  Oropharynx non-erythematous. Neck: No stridor.  Cardiovascular: Normal rate, regular rhythm. Good peripheral circulation. Grossly normal heart sounds.   Respiratory: Normal respiratory effort.  No retractions. Lungs CTAB. Gastrointestinal: Soft and nontender. No distention.  Musculoskeletal: No lower extremity tenderness nor edema. No gross deformities of  extremities. Neurologic:  Normal speech and language. No gross focal neurologic deficits are appreciated.  Skin:  Skin is warm, dry and intact. No rash noted.   ____________________________________________   LABS (all labs ordered are listed, but only abnormal results are displayed)  Labs Reviewed  BASIC METABOLIC PANEL - Abnormal; Notable for the following:       Result Value   Potassium 3.4 (*)    Chloride 99 (*)    Glucose, Bld 137 (*)    All other components within normal limits  CBC - Abnormal; Notable for the following:    WBC 15.2 (*)    Hemoglobin 15.4 (*)    All other components within normal limits  D-DIMER, QUANTITATIVE (NOT AT [facility]) - Abnormal; Notable for the following:    D-Dimer, Quant 2.75 (*)    All other components within normal limits  CBC - Abnormal; Notable for the following:    WBC 13.0 (*)    All other components within normal limits  I-STAT CHEM 8, ED - Abnormal; Notable for the following:    Potassium 3.4 (*)    Chloride 99 (*)    Glucose, Bld 132 (*)    Calcium, Ion 1.12 (*)    Hemoglobin 16.3 (*)    HCT 48.0 (*)    All other components within normal limits  TSH  CREATININE, SERUM  TROPONIN I  TROPONIN I  TROPONIN I  LIPID PANEL  BASIC METABOLIC PANEL  CBC  HEPARIN LEVEL (UNFRACTIONATED)  I-STAT TROPOININ, ED   ____________________________________________  EKG  Reviewed in MUSE. [name] with new ST elevations inferiorly. Concerning for STEMI.   ____________________________________________  RADIOLOGY  Ct Angio Chest Pe W Or Wo Contrast  Result Date: 02/09/2016 CLINICAL DATA:  65 year old female with acute chest pain and elevated D-dimer. EXAM: CT ANGIOGRAPHY CHEST WITH CONTRAST TECHNIQUE: Multidetector CT imaging through the chest was performed using the standard protocol during bolus administration of intravenous contrast. Multiplanar reconstructed images and MIPs were obtained and reviewed to evaluate the vascular anatomy. CONTRAST:   100 cc intravenous Isovue 370 COMPARISON:  02/09/2016 chest radiograph and 04/21/2012 chest CT FINDINGS: CTA CHEST FINDINGS Cardiovascular: This is a technically satisfactory study. No pulmonary emboli are identified. Cardiomegaly identified. Ectatic ascending thoracic aorta noted measuring 3.7 cm in greatest diameter. Mediastinum/Nodes: No mediastinal mass, enlarged lymph nodes or pericardial effusion. Lungs/Pleura: Small bilateral pleural effusions and moderate bilateral lower lobe atelectasis identified. No definite airspace disease, nodule, mass or endobronchial/ endotracheal lesion. There is no evidence of pneumothorax. Upper Abdomen: No acute abnormality. Musculoskeletal: No acute or suspicious abnormality. Review of the MIP images confirms the above findings IMPRESSION: No evidence of pulmonary emboli. Cardiomegaly with ectatic ascending thoracic aorta. Small bilateral pleural effusions and moderate bilateral lower lobe atelectasis. Electronically Signed   By: Harmon Pier M.D.   On: 02/09/2016 18:31   Dg Chest Portable 1 View  Result Date: 02/09/2016 CLINICAL DATA:  Chest pain EXAM: PORTABLE CHEST 1 VIEW COMPARISON:  03/24/2013 chest radiograph. FINDINGS: Low  lung volumes. Stable cardiomediastinal silhouette with normal heart size. No pneumothorax. No pleural effusion. No pulmonary edema. Mild bibasilar hazy opacities. Cholecystectomy clips are seen in the right upper quadrant of the abdomen. IMPRESSION: Low lung volumes with mild hazy bibasilar lung opacities, favor atelectasis. Electronically Signed   By: Delbert Phenix M.D.   On: 02/09/2016 11:51    ____________________________________________   PROCEDURES  Procedure(s) performed:   Procedures  Code STEMI  ____________________________________________   INITIAL IMPRESSION / ASSESSMENT AND PLAN / ED COURSE  Pertinent labs & imaging results that were available during my care of the patient were reviewed by me and considered in my medical  decision making (see chart for details).  Patient presents with acute onset chest pain. Patient appears uncomfortable. Activated code STEMI with abnormal EKG with concern for ST elevation inferiorly. Will give ASA and attempt to control pain while discussing with cardiology. Patient is HDS.   11:18 AM Spoke with STEMI Dr. Eldridge Dace who has reviewed the EKG in MUSE. Question whether or not this is rate related change versus true ischemia. CODE STEMI cancelled.   11:48 AM Pain improving somewhat. Cardiology has seen in the ED.   Plan for urgent heart cath with active pain and EKG changes. Updated patient and re-evaluated prior to leaving ED.  ____________________________________________  FINAL CLINICAL IMPRESSION(S) / ED DIAGNOSES  Final diagnoses:  Nonspecific chest pain     MEDICATIONS GIVEN DURING THIS VISIT:  ASA 325 mg   NEW OUTPATIENT MEDICATIONS STARTED DURING THIS VISIT:  None   Note:  This document was prepared using Dragon voice recognition software and may include unintentional dictation errors.  Alona Bene, MD Emergency Medicine   Maia Plan, MD 02/09/16 [phone]

## 2016-02-09 NOTE — Care Management Obs Status (Signed)
MEDICARE OBSERVATION STATUS NOTIFICATION   Patient Details  Name: Sydney Wolfe MRN: [id number] Date of Birth: 09/10/1950   Medicare Observation Status Notification Given:  Yes    Leone Haven, RN 02/09/2016, 5:45 PM

## 2016-02-09 NOTE — ED Notes (Signed)
CODE STEMI ACTIVATED  

## 2016-02-09 NOTE — ED Notes (Signed)
CARDIOLOGY at bedside

## 2016-02-09 NOTE — Progress Notes (Signed)
ANTICOAGULATION CONSULT NOTE - Initial Consult  Pharmacy Consult for heparin  Indication: pulmonary embolus  No Known Allergies  Patient Measurements: Height: 5\' 10"  (177.8 cm) Weight: 200 lb (90.7 kg) IBW/kg (Calculated) : 68.5 Heparin Dosing Weight: 87.1  Vital Signs: Temp: 98.9 F (37.2 C) (09/15 1600) Temp Source: Oral (09/15 1600) BP: 119/53 (09/15 1639) Pulse Rate: 96 (09/15 1639)  Labs:  Recent Labs  02/09/16 1123 02/09/16 1133 02/09/16 1605  HGB 15.4* 16.3* 13.5  HCT 45.9 48.0* 41.4  PLT 221  --  202  CREATININE 0.80 0.70 0.78  TROPONINI  --   --  <0.03    Estimated Creatinine Clearance: 84.5 mL/min (by C-G formula based on SCr of 0.78 mg/dL).   Medical History: Past Medical History:  Diagnosis Date  . Arthritis   . Gout   . Heart murmur   . 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 Hot flashes   . Hyperlipidemia   . Hypertension   . Hypothyroidism   . Normal nuclear stress test 2004  . OA (osteoarthritis)   . Obesity   . [name] (right bundle branch block)     Assessment: Sydney Wolfe is a 65 yo female who presented to [facility] Emergency Department for chest pain. Cardiology was consulted and heart catheterization did not show blockage. Patient is being started on heparin for PE at this time. Will avoid bolus due to recent procedure. Hemoglobin and platelets are stable at this time.  Goal of Therapy:  Heparin level 0.3-0.7 units/ml Monitor platelets by anticoagulation protocol: Yes   Plan:  Start heparin infusion at 1450 units/hr  6 hour heparin level Daily heparin level and CBC Monitor for s/sx of bleeding  [facility], PharmD Clinical Pharmacist Pager: [phone] 02/09/2016 5:11 PM

## 2016-02-09 NOTE — ED Notes (Signed)
Pt transported to cardiac cath lab on zoll pads and monitor.

## 2016-02-09 NOTE — Consult Note (Addendum)
Sydney Wolfe WUJ:[id number] DOB: [birth year] DOA: 02/09/2016     PCP: Eartha InchBADGER,MICHAEL C, MD   Outpatient Specialists: [facility]  , [facility] Patient coming from:  home Lives  With family    Reason for Consult : severe non-cardiac chest pain Vernona RiegerLaura PA  requested consult on behalf of Dr. Mayford Knifeurner M.D.   HPI: Sydney Nethamela R Bogusz is a 65 y.o. female with medical history significant of HTN, rheumatoid arthritis, [name]    Presented with chest/back pain starting since 5 AM   Started in mid center since the morning when she will call put associated left arm heaviness and jaw pain she took nitroglycerin but it did not seem to help. Patient describes sensation S heaviness she has no prior history of coronary artery disease no history of DVT or PE. This was associated and mild dyspnea. No diaphoresis. Of note yesterday patient developed pain in the back of her neck she foot with get better but this morning it started to radiate to her chest. The pain became so severe that she presented to emergency department reported pain was worse with palpation as well as inspiration. She and have not had any recent travel history. No leg edema She was seen in emergency department started on heparin Cardiology has been consulted initially cold STEMI was called but it was canceled Patient was admitted by cardiology and undergone cardiac catheterization that showed no coronary artery disease Given persistent severe chest pain and elevated d-dimer CT chest was done Was negative for PE which point cardiology requested medicine consult for evaluation of possible other non cardiac causes of chest pain  Patient reports bilateral arm tingling. Family is concerned that she may have cervical  disc problem says she has had them in the past. Family requesting MRI To clarify this. Family stating has symptoms similar to prior episode of bulging cervical disc.  Regarding patient history of rheumatoid disease  patient states that in the past she did not tolerate Remicade. She has intermittent joint pains arm swelling hand joint swelling and pain. Regarding pertinent Chronic problems:  Of note the patient has been evaluated by her primary care for physician 1 week ago for severe left shoulder pain which is pounding sharp associated with this stiffness and limited in motion of her left shoulder. Patient does not endorse any fevers or chills no nausea no vomiting no diaphoresis no shortness of breath no sinus numbness or tingling. On September 7 patient was prescribed Flexeril and was advised to use he denies pack for her left shoulder.  Patient had been seen in the past by cardiology for chest pain with normal stress milieu and normal cardiac MRI in 2014 17 continues to have occasional dyspnea and exertion.   IN ER:  Temp (24hrs), Avg:98.4 F (36.9 C), Min:97.9 F (36.6 C), Max:98.9 F (37.2 C) Sodium 138 potassium 3.4 hemoglobin 16.3 WBC 15.2  Chest x-ray showed low lung volumes CT [employer] [facility]hire O no evidence of PE but did show cardiomegaly with ectatic ascending thoracic aorta   Following Medications were ordered in ER: Medications  rosuvastatin (CRESTOR) tablet 20 mg (not administered)  carvedilol (COREG) tablet 6.25 mg (6.25 mg Oral Not Given 02/09/16 1720)  levothyroxine (SYNTHROID, LEVOTHROID) tablet 50 mcg (not administered)  timolol (TIMOPTIC) 0.5 % ophthalmic solution 1 drop (not administered)  nitroGLYCERIN (NITROSTAT) SL tablet 0.4 mg (not administered)  aspirin chewable tablet 81 mg (not administered)  acetaminophen (TYLENOL) tablet 650 mg (not administered)  ondansetron (ZOFRAN) injection 4 mg (  not administered)  heparin 5000 UNIT/ML injection (not administered)  sodium chloride flush (NS) 0.9 % injection 3 mL (not administered)  sodium chloride flush (NS) 0.9 % injection 3 mL (not administered)  0.9 %  sodium chloride infusion (not administered)  0.9% sodium chloride infusion (0  mL/kg/hr  90.7 kg Intravenous Stopped 02/09/16 1745)  morphine 2 MG/ML injection 2 mg (not administered)  fentaNYL (SUBLIMAZE) injection 50 mcg (50 mcg Intravenous Given 02/09/16 1137)  aspirin chewable tablet 324 mg (324 mg Oral Given 02/09/16 1126)  potassium chloride SA (K-DUR,KLOR-CON) CR tablet 40 mEq (40 mEq Oral Given 02/09/16 1200)  heparin bolus via infusion 4,000 Units (4,000 Units Intravenous Bolus from Bag 02/09/16 1202)  morphine 2 MG/ML injection (2 mg Intravenous Given 02/09/16 1420)  iopamidol (ISOVUE-370) 76 % injection (100 mLs  Contrast Given 02/09/16 1750)       Review of Systems:    Pertinent positives include:   chest pain, shoulder pain,  neck pain dyspnea on exertion  Constitutional:  No weight loss, night sweats, Fevers, chills, fatigue, weight loss  HEENT:  No headaches, Difficulty swallowing,Tooth/dental problems,Sore throat,  No sneezing, itching, ear ache, nasal congestion, post nasal drip,  Cardio-vascular:  No Orthopnea, PND, anasarca, dizziness, palpitations.no Bilateral lower extremity swelling  GI:  No heartburn, indigestion, abdominal pain, nausea, vomiting, diarrhea, change in bowel habits, loss of appetite, melena, blood in stool, hematemesis Resp:  no shortness of breath at rest. No , No excess mucus, no productive cough, No non-productive cough, No coughing up of blood.No change in color of mucus.No wheezing. Skin:  no rash or lesions. No jaundice GU:  no dysuria, change in color of urine, no urgency or frequency. No straining to urinate.  No flank pain.  Musculoskeletal:  No joint pain or no joint swelling. No decreased range of motion. No back pain.  Psych:  No change in mood or affect. No depression or anxiety. No memory loss.  Neuro: no localizing neurological complaints, no tingling, no weakness, no double vision, no gait abnormality, no slurred speech, no confusion  As per HPI otherwise 10 point review of systems negative.   Past Medical  History: Past Medical History:  Diagnosis Date  . Arthritis   . Gout   . Heart murmur   . 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 Hot flashes   . Hyperlipidemia   . Hypertension   . Hypothyroidism   . Normal nuclear stress test 2004  . OA (osteoarthritis)   . Obesity   . [name] (right bundle branch block)    Past Surgical History:  Procedure Laterality Date  . CARDIAC CATHETERIZATION N/A 02/09/2016   Procedure: Left Heart Cath and Coronary Angiography;  Surgeon: Corky Crafts, MD;  [facility] INVASIVE CV LAB;  Service: Cardiovascular;  Laterality: N/A;  . CARDIOVASCULAR STRESS TEST  08/20/2002   EF 76%  . CARPAL TUNNEL RELEASE    . CHOLECYSTECTOMY  1987  . DILATION AND CURETTAGE OF UTERUS  multiple  . HAND SURGERY  1987 and 1988   bilateral  . SPINE SURGERY  08/2008   T2  . TUBAL LIGATION  1975  . US ECHOCARDIOGRAPHY  07/25/2004   EF 55-60%     Social History:  Ambulatory   independently     reports that she has never smoked. She has never used smokeless tobacco. She reports that she does not drink alcohol or use drugs.  Allergies:  No Known  Allergies     Family History:   Family History  Problem Relation Age of Onset  . Heart failure Mother   . Hypertension Father   . Stroke Father   . Diabetes Father     Medications: Prior to Admission medications   Medication Sig Start Date End Date Taking? Authorizing Provider  aspirin 81 MG tablet Take 81 mg by mouth daily.      Historical Provider, MD  BETIMOL 0.5 % ophthalmic solution Apply 1 drop to eye 2 (two) times daily.  11/02/14   Historical Provider, MD  carvedilol (COREG) 6.25 MG tablet TAKE 1 TABLET(6.25 MG) BY MOUTH TWICE DAILY 12/22/15   Vesta Mixer, MD  hydrochlorothiazide (HYDRODIURIL) 25 MG tablet Take 1 tablet (25 mg total) by mouth daily. 01/04/16   Vesta Mixer, MD  inFLIXimab (REMICADE) 100 MG injection Inject 100 mg into the vein every 6 (six) weeks.     Historical Provider, MD  levothyroxine (SYNTHROID, LEVOTHROID) 50 MCG tablet Take 1 tablet by mouth daily. 10/24/15   Historical Provider, MD  naproxen (NAPROSYN) 500 MG tablet Take 1 tablet by mouth 2 (two) times daily. 11/14/15   Historical Provider, MD  nitroGLYCERIN (NITROSTAT) 0.4 MG SL tablet Place 1 tablet (0.4 mg total) under the tongue every 5 (five) minutes as needed for chest pain. 03/26/13   Joline Salt Barrett, PA-C  rosuvastatin (CRESTOR) 5 MG tablet Takes 1 tablet daily 01/22/16   Vesta Mixer, MD    Physical Exam: Patient Vitals for the past 24 hrs:  BP Temp Temp src Pulse Resp SpO2 Height Weight  02/09/16 1902 126/63 - - - (!) 26 93 % - -  02/09/16 1830 109/61 - - 100 (!) 26 91 % - -  02/09/16 1823 131/71 - - (!) 101 (!) 26 91 % - -  02/09/16 1700 113/63 - - 97 (!) 28 97 % - -  02/09/16 1639 (!) 119/53 - - 96 (!) 33 93 % - -  02/09/16 1600 (!) 116/55 98.9 F (37.2 C) Oral 95 20 94 % - -  02/09/16 1545 (!) 116/55 - - 95 (!) 23 93 % - -  02/09/16 1530 119/63 - - 95 (!) 32 94 % - -  02/09/16 1500 123/65 - - 94 (!) 30 93 % - -  02/09/16 1415 113/65 - - 93 (!) 22 96 % - -  02/09/16 1400 107/61 - - 93 20 94 % - -  02/09/16 1345 119/64 - - 92 (!) 28 97 % - -  02/09/16 1340 120/65 98.4 F (36.9 C) Oral 92 16 98 % - -  02/09/16 1322 113/66 - - (!) 130 14 100 % - -  02/09/16 1317 114/68 - - 91 19 100 % - -  02/09/16 1312 114/71 - - 90 19 99 % - -  02/09/16 1311 108/72 - - 88 18 99 % - -  02/09/16 1306 109/61 - - 93 (!) 21 99 % - -  02/09/16 1302 111/69 - - 88 (!) 21 97 % - -  02/09/16 1257 106/67 - - 91 (!) 22 97 % - -  02/09/16 1252 106/65 - - 93 12 96 % - -  02/09/16 1247 111/69 - - 93 (!) 22 98 % - -  02/09/16 1242 112/67 - - 97 16 98 % - -  02/09/16 1237 110/68 - - 96 16 98 % - -  02/09/16 1232 124/77 - - 100 (!) 22 97 % - -  02/09/16 1227 133/81 - - 100 (!) 22 97 % - -  02/09/16 1224 - - - - - 97 % - -  02/09/16 1222 129/84 - - (!) 101 20 93 % - -  02/09/16 1222 - - -  (!) 102 - - - -  02/09/16 1220 - - - 89 (!) 21 92 % - -  02/09/16 1144 121/85 - - 101 18 95 % - -  02/09/16 1127 143/76 - - 105 18 99 % - -  02/09/16 1107 136/73 97.9 F (36.6 C) Oral 106 16 97 % - -  02/09/16 1106 - - - - - - 5\' 10"  (1.778 m) 90.7 kg (200 lb)    1. General:  in No Acute distress 2. Psychological: Alert and  Oriented 3. Head/ENT:    Dry Mucous Membranes                          Head Non traumatic, neck supple                           Poor Dentition 4. SKIN:  decreased Skin turgor,  Skin clean Dry and intact no rash 5. Heart: Regular rate and rhythm no  Murmur, Rub or gallop 6. Lungs:   no wheezes or crackles  Slightly diminished at the bases 7. Abdomen: Soft,  non-tender, Non distended 8. Lower extremities: no clubbing, cyanosis, or edema 9. Neurologically Grossly intact, moving all 4 extremities equally  10. MSK: Normal range of motionPain with left arm muscle movement radiating to the chest and subscapular   body mass index is 28.7 kg/m.  Labs on Admission:   Labs on Admission: I have personally reviewed following labs and imaging studies  CBC:  Recent Labs Lab 02/09/16 1123 02/09/16 1133 02/09/16 1605  WBC 15.2*  --  13.0*  HGB 15.4* 16.3* 13.5  HCT 45.9 48.0* 41.4  MCV 96.0  --  96.5  PLT 221  --  202   Basic Metabolic Panel:  Recent Labs Lab 02/09/16 1123 02/09/16 1133 02/09/16 1605  NA 136 138  --   K 3.4* 3.4*  --   CL 99* 99*  --   CO2 26  --   --   GLUCOSE 137* 132*  --   BUN 11 12  --   CREATININE 0.80 0.70 0.78  CALCIUM 9.5  --   --    GFR: Estimated Creatinine Clearance: 84.5 mL/min (by C-G formula based on SCr of 0.78 mg/dL). Liver Function Tests: No results for input(s): AST, ALT, ALKPHOS, BILITOT, PROT, ALBUMIN in the last 168 hours. No results for input(s): LIPASE, AMYLASE in the last 168 hours. No results for input(s): AMMONIA in the last 168 hours. Coagulation Profile: No results for input(s): INR, PROTIME in the  last 168 hours. Cardiac Enzymes:  Recent Labs Lab 02/09/16 1605  TROPONINI <0.03   BNP (last 3 results) No results for input(s): PROBNP in the last 8760 hours. HbA1C: No results for input(s): HGBA1C in the last 72 hours. CBG: No results for input(s): GLUCAP in the last 168 hours. Lipid Profile: No results for input(s): CHOL, HDL, LDLCALC, TRIG, CHOLHDL, LDLDIRECT in the last 72 hours. Thyroid Function Tests:  Recent Labs  02/09/16 1605  TSH 2.228   Anemia Panel: No results for input(s): VITAMINB12, FOLATE, FERRITIN, TIBC, IRON, RETICCTPCT in the last 72 hours.  Sepsis Labs: @LABRCNTIP (procalcitonin:4,lacticidven:4) )No results found for  this or any previous visit (from the past 240 hour(s)).     UA not ordered  Lab Results  Component Value Date   HGBA1C 5.6 03/24/2013    Estimated Creatinine Clearance: 84.5 mL/min (by C-G formula based on SCr of 0.78 mg/dL).  BNP (last 3 results) No results for input(s): PROBNP in the last 8760 hours.   ECG REPORT  Independently reviewed Rate: 101  Rhythm: Sinus tachycardia right bundle branch block ST&T Change: ST segment changes QTC 461  Filed Weights   02/09/16 1106  Weight: 90.7 kg (200 lb)     Cultures: No results found for: SDES, SPECREQUEST, CULT, REPTSTATUS   Radiological Exams on Admission: Ct Angio Chest Pe W Or Wo Contrast  Result Date: 02/09/2016 CLINICAL DATA:  66 year old female with acute chest pain and elevated D-dimer. EXAM: CT ANGIOGRAPHY CHEST WITH CONTRAST TECHNIQUE: Multidetector CT imaging through the chest was performed using the standard protocol during bolus administration of intravenous contrast. Multiplanar reconstructed images and MIPs were obtained and reviewed to evaluate the vascular anatomy. CONTRAST:  100 cc intravenous Isovue 370 COMPARISON:  02/09/2016 chest radiograph and 04/21/2012 chest CT FINDINGS: CTA CHEST FINDINGS Cardiovascular: This is a technically satisfactory study. No  pulmonary emboli are identified. Cardiomegaly identified. Ectatic ascending thoracic aorta noted measuring 3.7 cm in greatest diameter. Mediastinum/Nodes: No mediastinal mass, enlarged lymph nodes or pericardial effusion. Lungs/Pleura: Small bilateral pleural effusions and moderate bilateral lower lobe atelectasis identified. No definite airspace disease, nodule, mass or endobronchial/ endotracheal lesion. There is no evidence of pneumothorax. Upper Abdomen: No acute abnormality. Musculoskeletal: No acute or suspicious abnormality. Review of the MIP images confirms the above findings IMPRESSION: No evidence of pulmonary emboli. Cardiomegaly with ectatic ascending thoracic aorta. Small bilateral pleural effusions and moderate bilateral lower lobe atelectasis. Electronically Signed   By: Harmon Pier M.D.   On: 02/09/2016 18:31   Dg Chest Portable 1 View  Result Date: 02/09/2016 CLINICAL DATA:  Chest pain EXAM: PORTABLE CHEST 1 VIEW COMPARISON:  03/24/2013 chest radiograph. FINDINGS: Low lung volumes. Stable cardiomediastinal silhouette with normal heart size. No pneumothorax. No pleural effusion. No pulmonary edema. Mild bibasilar hazy opacities. Cholecystectomy clips are seen in the right upper quadrant of the abdomen. IMPRESSION: Low lung volumes with mild hazy bibasilar lung opacities, favor atelectasis. Electronically Signed   By: Delbert Phenix M.D.   On: 02/09/2016 11:51    Chart has been reviewed    Assessment/Plan  65 y.o. female with medical history significant of HTN, rheumatoid arthritis, [name] being evaluated for severe chest pain appears to be not cardiac in etiology  Present on Admission:  . Chest pain, mid sternal - chest pain most likely represents musculoskeletal appears to be reproducible to palpation and pectoral muscle involvement, pain management with Robaxin and Ultram. Holding off on Toradol for tonight this patient has recently had cardiac catheterization and still on heparin but  this also could be useful. Would defer to rheumatology if initiation of steroids would be beneficial. Transferred to telemetry floor for continued cardiac care status post recent cardiac catheterization. Given bilateral paresthesias will obtain MRI of the cervical spine. So would be helpful to evaluate for any cervical instability related to rheumatoid arthritis . Rheumatoid arthritis (HCC) - we will need to discuss case with rheumatology when possible. Order rheumatoid factor to confirm and sedimentation rate and CRP to see if there is  evidence of acute exacerbation . Essential hypertension continue home medications  Other plan as per orders.  Family Communication:   Family  at  Bedside   Disposition Plan:    To home once workup is complete and patient is stable           Level of care     tele             I have spent a total of 65 min on this consult thank you for this interesting consult  Yatziri Wainwright 02/09/2016, 8:29 PM    [facility]  Pager [phone]   after 2 AM please page floor coverage PA If 7AM-7PM, please contact the day team taking care of the patient  [email or web address]  Password [email or web address]

## 2016-02-09 NOTE — Progress Notes (Signed)
IV team member here at bedside attempting to start 20G IV so patient can go down for CT scan. Dr Mayford Knife called and informed of D-Dimer results and wants patient to have a CT Scan of chest. Radiology called and states patient needs the 20G IV prior to getting CT Scan. Attempted IV insertion to right arm and failed. IV Team consulted. Dr Mayford Knife made aware and states she plans to send patient to ICU after CT Scan is obtained and patient needs test performed with contrast. Patient has been medicated for chest pain and states her pain is better, see flow sheet. No further complaints voiced at this time. Family remains at bedside.

## 2016-02-09 NOTE — Progress Notes (Signed)
Nada Boozer NP states to get EKG on patient. EKG obtained and Nada Boozer NP notified of results and states that it is the same as before. Traci RN also views EKG as report is given to her. Patient's linen being changed at this time. Report given to [facility], patient having chest pain again at this time. Nada Boozer NP informed by Woody Seller. Tracie RN states she will medicate patient for pain. Family remains at bedside. No other complaints voiced or distress noted at this time.

## 2016-02-09 NOTE — Interval H&P Note (Signed)
Cath Lab Visit (complete for each Cath Lab visit)  Clinical Evaluation Leading to the Procedure:   ACS: Yes.    Non-ACS:    Anginal Classification: CCS IV  Anti-ischemic medical therapy: Minimal Therapy (1 class of medications)  Non-Invasive Test Results: No non-invasive testing performed  Prior CABG: No previous CABG      History and Physical Interval Note:  02/09/2016 12:25 PM  Sydney Wolfe  has presented today for surgery, with the diagnosis of cp  The various methods of treatment have been discussed with the patient and family. After consideration of risks, benefits and other options for treatment, the patient has consented to  Procedure(s): Left Heart Cath and Coronary Angiography (N/A) as a surgical intervention .  The patient's history has been reviewed, patient examined, no change in status, stable for surgery.  I have reviewed the patient's chart and labs.  Questions were answered to the patient's satisfaction.     Lance Muss

## 2016-02-09 NOTE — Telephone Encounter (Signed)
New message     FYI    Pt is calling stating that she is experiencing chest pains and in on her way to the hospital.

## 2016-02-09 NOTE — Telephone Encounter (Signed)
Noted.  Will check on her next week.

## 2016-02-09 NOTE — H&P (Signed)
Admit date: 02/09/2016 Referring Physician: Dr. Jacqulyn Bath Primary Cardiologist: Dr. Elease Hashimoto Chief complaint/reason for admission:Chest pain  HPI: This is [name] with a history of RA, Hyperlipidemia, HTN who is followed by Dr. Elease Hashimoto for chronic [name] and prior history of CP with normal stress myoview and normal cardiac MRI in 2014.  She recently saw him for complaints of DOE and chest pain when climbing stairs and other exertional activities.  She described it as a weight on her chest.  She underwent nuclear stress test that was low risk with fixed apical defect.  Since then she has continued to have DOE.  Her and her husband were out yesterday and she developed pain across the back of her neck.  This was intermittent in severity throughout the day but never went completely away and radiated into her chest.  She was able to sleep last night but this am when she awakened she had sudden onset of severe squeezing pain in her left chest with radiation into her jaw and left arm heaviness.  She came to [facility] ER.  In ER she is very uncomfortable complaining of severe CP somewhat worse with palpation of her chest wall and somewhat worse with inspiration.  Her initial O2 sats on RA were 97%.  She denies any recent long distance travel (last trip to [place] 11/28/2015).  She has a strong family history of CAD with her dad having an MI and her brother died of an MI.  Cardiology is now asked to evaluate.    PMH:    Past Medical History:  Diagnosis Date  . Arthritis   . Gout   . 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 Hot flashes   . Hyperlipidemia   . Hypertension   . Hypothyroidism   . Normal nuclear stress test 2004  . OA (osteoarthritis)   . Obesity     PSH:    Past Surgical History:  Procedure Laterality Date  . CARDIOVASCULAR STRESS TEST  08/20/2002   EF 76%  . CHOLECYSTECTOMY  1987  . DILATION AND CURETTAGE OF UTERUS  multiple  . HAND SURGERY  1987 and 1988     bilateral  . SPINE SURGERY  08/2008   T2  . TUBAL LIGATION  1975  . US ECHOCARDIOGRAPHY  07/25/2004   EF 55-60%    ALLERGIES:   Review of patient's allergies indicates no known allergies.  Prior to Admit Meds:   (Not in a hospital admission) Family HX:    Family History  Problem Relation Age of Onset  . Heart failure Mother   . Hypertension Father   . Stroke Father   . Diabetes Father    Social HX:    Social History   Social History  . Marital status: Married    Spouse name: N/A  . Number of children: N/A  . Years of education: N/A   Occupational History  . Not on file.   Social History Main Topics  . Smoking status: Never Smoker  . Smokeless tobacco: Never Used  . Alcohol use No  . Drug use: No  . Sexual activity: Yes   Other Topics Concern  . Not on file   Social History Narrative  . No narrative on file     ROS:  All 11 ROS were addressed and are negative except what is stated in the HPI  PHYSICAL EXAM Vitals:   02/09/16 1127 02/09/16 1144  BP:  143/76 121/85  Pulse: 105 101  Resp: 18 18  Temp:     General: Well developed, well nourished, in moderate to severe acute distress due to severe pain Head: Eyes PERRLA, No xanthomas.   Normal cephalic and atramatic  Lungs:   Clear bilaterally to auscultation and percussion. Heart:   HRRR S1 S2 Pulses are 2+ & equal.            No carotid bruit. No JVD.  No abdominal bruits. No femoral bruits. Abdomen: Bowel sounds are positive, abdomen soft and non-tender without masses  Msk:  Back normal, normal gait. Normal strength and tone for age. Extremities:   No clubbing, cyanosis or edema.  DP +2 and bounding and equal Neuro: Alert and oriented X 3. Psych:  Good affect, responds appropriately   Labs:   Lab Results  Component Value Date   WBC 6.5 03/25/2013   HGB 16.3 (H) 02/09/2016   HCT 48.0 (H) 02/09/2016   MCV 91.3 03/25/2013   PLT 159 03/25/2013    Recent Labs Lab 02/09/16 1133  NA 138  K 3.4*   CL 99*  BUN 12  CREATININE 0.70  GLUCOSE 132*   Lab Results  Component Value Date   TROPONINI <0.30 03/25/2013   No results found for: PTT Lab Results  Component Value Date   INR 1.00 03/25/2013   INR 0.9 08/08/2008     Lab Results  Component Value Date   CHOL 148 12/14/2015   CHOL 141 12/15/2014   CHOL 131 10/04/2013   Lab Results  Component Value Date   HDL 47 12/14/2015   HDL 45 12/15/2014   HDL 64 10/04/2013   Lab Results  Component Value Date   LDLCALC 71 12/14/2015   LDLCALC 65 12/15/2014   LDLCALC 47 10/04/2013   Lab Results  Component Value Date   TRIG 152 (H) 12/14/2015   TRIG 157 (H) 12/15/2014   TRIG 98 10/04/2013   Lab Results  Component Value Date   CHOLHDL 3.1 12/14/2015   CHOLHDL 3.1 12/15/2014   CHOLHDL 2.0 10/04/2013   No results found for: LDLDIRECT    Radiology:  No results found.  EKG:  Sinus tachycardia at 106bpm with [name]  ASSESSMENT/PLAN:   1.  Unstable angina - acute onset of chest pain and SOB.  CP quality is somewhat atypical in that it occurred after rolling over in bed and is somewhat worse with palpation and inspiration but typical in that it radiates into her jaw and has left arm heaviness.  EKG shows unchanged [name] with no acute ST changes.  She has been having exertional CP and DOE for several months with recent low risk nuclear stress test.  D-Dime pending but doubt PE with normal O2 sats on presentation and no recent long travel.  Unlikely to be aortic dissection in setting of equal pulses on exam.  Given severity of symptoms, recommend proceeding with urgent cardiac cath to define coronary anatomy.   Cardiac catheterization was discussed with the patient fully. The patient understands that risks include but are not limited to stroke (1 in 1000), death (1 in 1000), kidney failure [usually temporary] (1 in 500), bleeding (1 in 200), allergic reaction [possibly serious] (1 in 200).  The patient understands and is willing to  proceed.  Continue ASA.  Start IV Heparin per pharmacy.  Continue BB.  Hold diuretic.   2.  Chronic [name]  3.  HTN - BP controlled on current meds.  Continue BB.  4.  Hyperlipidemia - continue statin and check FLP and ALT in am.  5.  RA on Remicaide/naproxen.  6.  Erythrocytosis ? etiology  Armanda Magic, MD  02/09/2016  11:48 AM

## 2016-02-09 NOTE — Progress Notes (Signed)
TR BAND REMOVAL  LOCATION:    right radial  DEFLATED PER PROTOCOL:    Yes.    TIME BAND OFF / DRESSING APPLIED:    1600   SITE UPON ARRIVAL:    Level 0  SITE AFTER BAND REMOVAL:    Level 0  CIRCULATION SENSATION AND MOVEMENT:    Within Normal Limits   Yes.    COMMENTS:   Patient tolerated well. Dressing applied C/D/I. No hematoma noted. No bleeding noted. No pain at site. Post removal instructions provided.

## 2016-02-09 NOTE — Care Management Note (Signed)
Case Management Note  Patient Details  Name: Sydney Wolfe MRN: [id number] Date of Birth: 12-Dec-1950  Subjective/Objective:   S/p cath, NCM will cont to follow for dc needs.                  Action/Plan:   Expected Discharge Date:                  Expected Discharge Plan:  Home/Self Care  In-House Referral:     Discharge planning Services  CM Consult  Post Acute Care Choice:    Choice offered to:     DME Arranged:    DME Agency:     HH Arranged:    HH Agency:     Status of Service:  Completed, signed off  If discussed at [employer] of Stay Meetings, dates discussed:    Additional Comments:  Leone Haven, RN 02/09/2016, 3:09 PM

## 2016-02-09 NOTE — Progress Notes (Signed)
Cardiac cath showed normal coronary arteries with normal LVF and normal LVEDP.  D-Dimer elevated at 2.75.  Stat chest CT ordered but have not been able to get IV in for CT by nursing staff or IV team.  Attempted powerglide catheter without success.  Discussed with CCM and central line will not support injection.  After multiple attempts IV successful at placement.  Patient back on IV Heparin per pharmacy post sheath pull from cath.

## 2016-02-09 NOTE — Progress Notes (Addendum)
Patient has active back and chest pain; cardiology paged spoke with L.Ingold.  States to page Internal medicine; paged IM, spoke with PA Burnadette Peter, came to evaluate patient, MD Doutova, called by PA and came to bedside to evaluate patient as well.

## 2016-02-09 NOTE — ED Notes (Signed)
Pt transported to Cath Lab

## 2016-02-10 ENCOUNTER — Observation Stay (HOSPITAL_COMMUNITY): Payer: Medicare Other

## 2016-02-10 DIAGNOSIS — M069 Rheumatoid arthritis, unspecified: Secondary | ICD-10-CM | POA: Diagnosis present

## 2016-02-10 DIAGNOSIS — N390 Urinary tract infection, site not specified: Secondary | ICD-10-CM | POA: Diagnosis present

## 2016-02-10 DIAGNOSIS — R072 Precordial pain: Secondary | ICD-10-CM | POA: Diagnosis not present

## 2016-02-10 DIAGNOSIS — R079 Chest pain, unspecified: Secondary | ICD-10-CM | POA: Diagnosis present

## 2016-02-10 DIAGNOSIS — D751 Secondary polycythemia: Secondary | ICD-10-CM | POA: Diagnosis present

## 2016-02-10 DIAGNOSIS — E039 Hypothyroidism, unspecified: Secondary | ICD-10-CM | POA: Diagnosis present

## 2016-02-10 DIAGNOSIS — E785 Hyperlipidemia, unspecified: Secondary | ICD-10-CM | POA: Diagnosis present

## 2016-02-10 DIAGNOSIS — J9601 Acute respiratory failure with hypoxia: Secondary | ICD-10-CM | POA: Diagnosis present

## 2016-02-10 DIAGNOSIS — M542 Cervicalgia: Secondary | ICD-10-CM | POA: Diagnosis not present

## 2016-02-10 DIAGNOSIS — R1310 Dysphagia, unspecified: Secondary | ICD-10-CM | POA: Diagnosis present

## 2016-02-10 DIAGNOSIS — I110 Hypertensive heart disease with heart failure: Secondary | ICD-10-CM | POA: Diagnosis present

## 2016-02-10 DIAGNOSIS — I4510 Unspecified right bundle-branch block: Secondary | ICD-10-CM | POA: Diagnosis present

## 2016-02-10 DIAGNOSIS — R0789 Other chest pain: Secondary | ICD-10-CM | POA: Diagnosis present

## 2016-02-10 DIAGNOSIS — M4722 Other spondylosis with radiculopathy, cervical region: Secondary | ICD-10-CM | POA: Diagnosis present

## 2016-02-10 DIAGNOSIS — G8929 Other chronic pain: Secondary | ICD-10-CM | POA: Diagnosis present

## 2016-02-10 DIAGNOSIS — I5030 Unspecified diastolic (congestive) heart failure: Secondary | ICD-10-CM | POA: Diagnosis present

## 2016-02-10 DIAGNOSIS — J9811 Atelectasis: Secondary | ICD-10-CM | POA: Diagnosis present

## 2016-02-10 DIAGNOSIS — I471 Supraventricular tachycardia: Secondary | ICD-10-CM | POA: Diagnosis present

## 2016-02-10 DIAGNOSIS — Z8249 Family history of ischemic heart disease and other diseases of the circulatory system: Secondary | ICD-10-CM | POA: Diagnosis not present

## 2016-02-10 DIAGNOSIS — Z7982 Long term (current) use of aspirin: Secondary | ICD-10-CM | POA: Diagnosis not present

## 2016-02-10 LAB — CBC WITH DIFFERENTIAL/PLATELET
BASOS ABS: 0 10*3/uL (ref 0.0–0.1)
Basophils Relative: 0 %
EOS PCT: 0 %
Eosinophils Absolute: 0 10*3/uL (ref 0.0–0.7)
HEMATOCRIT: 40.7 % (ref 36.0–46.0)
Hemoglobin: 13.4 g/dL (ref 12.0–15.0)
LYMPHS ABS: 2.8 10*3/uL (ref 0.7–4.0)
LYMPHS PCT: 21 %
MCH: 31.8 pg (ref 26.0–34.0)
MCHC: 32.9 g/dL (ref 30.0–36.0)
MCV: 96.7 fL (ref 78.0–100.0)
MONO ABS: 1.6 10*3/uL — AB (ref 0.1–1.0)
Monocytes Relative: 11 %
NEUTROS ABS: 9.2 10*3/uL — AB (ref 1.7–7.7)
Neutrophils Relative %: 68 %
PLATELETS: 205 10*3/uL (ref 150–400)
RBC: 4.21 MIL/uL (ref 3.87–5.11)
RDW: 12.9 % (ref 11.5–15.5)
WBC: 13.6 10*3/uL — ABNORMAL HIGH (ref 4.0–10.5)

## 2016-02-10 LAB — URINE MICROSCOPIC-ADD ON: RBC / HPF: NONE SEEN RBC/hpf (ref 0–5)

## 2016-02-10 LAB — URINALYSIS, ROUTINE W REFLEX MICROSCOPIC
GLUCOSE, UA: NEGATIVE mg/dL
Hgb urine dipstick: NEGATIVE
KETONES UR: NEGATIVE mg/dL
NITRITE: POSITIVE — AB
PH: 6 (ref 5.0–8.0)
Protein, ur: 30 mg/dL — AB
SPECIFIC GRAVITY, URINE: 1.042 — AB (ref 1.005–1.030)

## 2016-02-10 LAB — COMPREHENSIVE METABOLIC PANEL
ALT: 45 U/L (ref 14–54)
AST: 66 U/L — AB (ref 15–41)
Albumin: 2.9 g/dL — ABNORMAL LOW (ref 3.5–5.0)
Alkaline Phosphatase: 52 U/L (ref 38–126)
Anion gap: 9 (ref 5–15)
BILIRUBIN TOTAL: 1.7 mg/dL — AB (ref 0.3–1.2)
BUN: 16 mg/dL (ref 6–20)
CHLORIDE: 102 mmol/L (ref 101–111)
CO2: 24 mmol/L (ref 22–32)
CREATININE: 1.07 mg/dL — AB (ref 0.44–1.00)
Calcium: 8.9 mg/dL (ref 8.9–10.3)
GFR, EST NON AFRICAN AMERICAN: 53 mL/min — AB (ref >60)
Glucose, Bld: 201 mg/dL — ABNORMAL HIGH (ref 65–99)
Potassium: 3.7 mmol/L (ref 3.5–5.1)
Sodium: 135 mmol/L (ref 135–145)
TOTAL PROTEIN: 7.2 g/dL (ref 6.5–8.1)

## 2016-02-10 LAB — LIPID PANEL
CHOLESTEROL: 92 mg/dL (ref 0–200)
HDL: 33 mg/dL — ABNORMAL LOW (ref >40)
LDL Cholesterol: 47 mg/dL (ref 0–99)
TRIGLYCERIDES: 60 mg/dL (ref <150)
Total CHOL/HDL Ratio: 2.8 RATIO
VLDL: 12 mg/dL (ref 0–40)

## 2016-02-10 LAB — TROPONIN I: Troponin I: 0.03 ng/mL (ref <0.03)

## 2016-02-10 LAB — C-REACTIVE PROTEIN: CRP: 17.1 mg/dL — ABNORMAL HIGH (ref <1.0)

## 2016-02-10 MED ORDER — SODIUM CHLORIDE 0.9 % IV SOLN
INTRAVENOUS | Status: DC
  Administered 2016-02-10: 15:00:00 via INTRAVENOUS

## 2016-02-10 MED ORDER — DEXTROSE 5 % IV SOLN
1.0000 g | INTRAVENOUS | Status: DC
  Administered 2016-02-10 – 2016-02-11 (×2): 1 g via INTRAVENOUS
  Filled 2016-02-10 (×2): qty 10

## 2016-02-10 MED ORDER — METHYLPREDNISOLONE SODIUM SUCC 125 MG IJ SOLR
125.0000 mg | Freq: Once | INTRAMUSCULAR | Status: AC
  Administered 2016-02-10: 125 mg via INTRAVENOUS
  Filled 2016-02-10: qty 2

## 2016-02-10 MED ORDER — SODIUM CHLORIDE 0.9 % IV BOLUS (SEPSIS)
500.0000 mL | Freq: Once | INTRAVENOUS | Status: AC
  Administered 2016-02-10: 500 mL via INTRAVENOUS

## 2016-02-10 MED ORDER — ACETAMINOPHEN 325 MG PO TABS
650.0000 mg | ORAL_TABLET | Freq: Once | ORAL | Status: AC
Start: 2016-02-10 — End: 2016-02-10

## 2016-02-10 MED ORDER — PREDNISONE 20 MG PO TABS
40.0000 mg | ORAL_TABLET | Freq: Every day | ORAL | Status: DC
  Administered 2016-02-10 – 2016-02-11 (×2): 40 mg via ORAL
  Filled 2016-02-10 (×2): qty 2

## 2016-02-10 NOTE — Progress Notes (Signed)
PROGRESS NOTE                                                                                                                                                                                                             Patient Demographics:    Sydney Wolfe, is a 65 y.o. female, DOB - 1950/06/22, FRE:[id number]  Admit date - 02/09/2016   Admitting Physician Jettie Booze, MD  Outpatient Primary MD for the patient is Chesley Noon, MD  LOS - 1   Chief Complaint  Patient presents with  . Chest Pain       Brief Narrative   65 y.o. female with medical history significant of HTN, rheumatoid arthritis, [name], Admitted with complaints of chest pain, seen by cardiology, status post cardiac cath which did show normal coronaries, has elevated D dimers, CTA chest negative for PE, [facility] hospitalist consulted for noncardiac chest pain.   Subjective:    Sydney Wolfe today has, No headache,  No abdominal pain , Patient complains of back pain and left shoulder blade area, still complains of musculoskeletal chest pain, fever 100.8 overnight.    Assessment  & Plan :    Active Problems:   Chest pain, mid sternal   Essential hypertension   Abnormal ECG   Rheumatoid arthritis (HCC)   Chest pain - appears to be musculoskeletal, cardiac cath with no Significant coronary artery disease, CTA chest negative for PE - Unclear to his chest pain is related to RA flare, but this is not typical for her previous flares  Rheumatoid  arthritis with possible flare - Patient with elevated CRP and ESR, no baseline to compare to, reports improvement on one dose of IV steroids, so we'll start on by mouth prednisone 40 mg daily.  Dysphagia - We'll obtain a esophageogram, SLP consult pending  Fever - Possibly related to atelectasis versus UTI, started on Rocephin, placed use incentive spirometry, follow urine cultures  Acute hypoxic respiratory failure - Patient  reports progressive dyspnea over last few weeks, CT chest significant for atelectasis, will encourage use incentive spirometry, patient has a follow-up with Dr. Melvyn Novas next Thursday, likely  will need oxygen on discharge  Cervical radiculopathy - MRI significant for radiculopathy, discussed with Dr. Cyndy Freeze, this can be followed as an outpatient    Code Status : Full  Family Communication  :  D/W husband and daughter at bedside  Disposition Plan  : home when stable  Procedures  : cardiac cath   DVT Prophylaxis  :   SCDs   Lab Results  Component Value Date   PLT 205 02/10/2016    Antibiotics  :    Anti-infectives    Start     Dose/Rate Route Frequency Ordered Stop   02/10/16 1300  cefTRIAXone (ROCEPHIN) 1 g in dextrose 5 % 50 mL IVPB     1 g 100 mL/hr over 30 Minutes Intravenous Every 24 hours 02/10/16 1223          Objective:   Vitals:   02/10/16 0355 02/10/16 1020 02/10/16 1406 02/10/16 1410  BP: 98/65   120/82  Pulse: 96  78 78  Resp: 20   18  Temp: 97.4 F (36.3 C) 97.5 F (36.4 C)  97.4 F (36.3 C)  TempSrc: Oral Oral  Axillary  SpO2: 96%  (!) 80% 94%  Weight:      Height:        Wt Readings from Last 3 Encounters:  02/09/16 90.7 kg (200 lb)  12/20/15 98.4 kg (217 lb)  12/14/15 98.5 kg (217 lb 1.9 oz)     Intake/Output Summary (Last 24 hours) at 02/10/16 1418 Last data filed at 02/10/16 1300  Gross per 24 hour  Intake          1109.86 ml  Output              220 ml  Net           889.86 ml     Physical Exam  Awake Alert, Oriented X 3,  Supple Neck,No JVD, Symmetrical Chest wall movement, Good air movement bilaterally, CTAB RRR,No Gallops,Rubs or new Murmurs, No Parasternal Heave +ve B.Sounds, Abd Soft, No tenderness, No organomegaly appriciated, No rebound - guarding or rigidity. No Cyanosis, Clubbing or edema, No new Rash or bruise      Data Review:    CBC  Recent Labs Lab 02/09/16 1123 02/09/16 1133 02/09/16 1605 02/10/16 0240    WBC 15.2*  --  13.0* 13.6*  HGB 15.4* 16.3* 13.5 13.4  HCT 45.9 48.0* 41.4 40.7  PLT 221  --  202 205  MCV 96.0  --  96.5 96.7  MCH 32.2  --  31.5 31.8  MCHC 33.6  --  32.6 32.9  RDW 12.5  --  12.8 12.9  LYMPHSABS  --   --   --  2.8  MONOABS  --   --   --  1.6*  EOSABS  --   --   --  0.0  BASOSABS  --   --   --  0.0    Chemistries   Recent Labs Lab 02/09/16 1123 02/09/16 1133 02/09/16 1605 02/10/16 0240  NA 136 138  --  135  K 3.4* 3.4*  --  3.7  CL 99* 99*  --  102  CO2 26  --   --  24  GLUCOSE 137* 132*  --  201*  BUN 11 12  --  16  CREATININE 0.80 0.70 0.78 1.07*  CALCIUM 9.5  --   --  8.9  AST  --   --   --  66*  ALT  --   --   --  45  ALKPHOS  --   --   --  52  BILITOT  --   --   --  1.7*   ------------------------------------------------------------------------------------------------------------------  Recent Labs  02/10/16 0240  CHOL 92  HDL 33*  LDLCALC 47  TRIG 60  CHOLHDL 2.8    Lab Results  Component Value Date   HGBA1C 5.6 03/24/2013   ------------------------------------------------------------------------------------------------------------------  Recent Labs  02/09/16 1605  TSH 2.228   ------------------------------------------------------------------------------------------------------------------ No results for input(s): VITAMINB12, FOLATE, FERRITIN, TIBC, IRON, RETICCTPCT in the last 72 hours.  Coagulation profile No results for input(s): INR, PROTIME in the last 168 hours.   Recent Labs  02/09/16 1313  DDIMER 2.75*    Cardiac Enzymes  Recent Labs Lab 02/09/16 1605 02/09/16 2011 02/10/16 0240  TROPONINI <0.03 <0.03 0.03*   ------------------------------------------------------------------------------------------------------------------ No results found for: BNP  Inpatient Medications  Scheduled Meds: . angioplasty book   Does not apply Once  . aspirin  81 mg Oral Daily  . carvedilol  6.25 mg Oral BID WC  .  cefTRIAXone (ROCEPHIN)  IV  1 g Intravenous Q24H  . levothyroxine  50 mcg Oral QAC breakfast  . rosuvastatin  20 mg Oral Daily  . sodium chloride flush  3 mL Intravenous Q12H  . timolol  1 drop Both Eyes BID   Continuous Infusions: . sodium chloride     PRN Meds:.sodium chloride, acetaminophen, methocarbamol (ROBAXIN)  IV, morphine injection, nitroGLYCERIN, ondansetron (ZOFRAN) IV, oxyCODONE-acetaminophen, sodium chloride flush, traMADol  Micro Results No results found for this or any previous visit (from the past 240 hour(s)).  Radiology Reports Ct Angio Chest Pe W Or Wo Contrast  Result Date: 02/09/2016 CLINICAL DATA:  65 year old female with acute chest pain and elevated D-dimer. EXAM: CT ANGIOGRAPHY CHEST WITH CONTRAST TECHNIQUE: Multidetector CT imaging through the chest was performed using the standard protocol during bolus administration of intravenous contrast. Multiplanar reconstructed images and MIPs were obtained and reviewed to evaluate the vascular anatomy. CONTRAST:  100 cc intravenous Isovue 370 COMPARISON:  02/09/2016 chest radiograph and 04/21/2012 chest CT FINDINGS: CTA CHEST FINDINGS Cardiovascular: This is a technically satisfactory study. No pulmonary emboli are identified. Cardiomegaly identified. Ectatic ascending thoracic aorta noted measuring 3.7 cm in greatest diameter. Mediastinum/Nodes: No mediastinal mass, enlarged lymph nodes or pericardial effusion. Lungs/Pleura: Small bilateral pleural effusions and moderate bilateral lower lobe atelectasis identified. No definite airspace disease, nodule, mass or endobronchial/ endotracheal lesion. There is no evidence of pneumothorax. Upper Abdomen: No acute abnormality. Musculoskeletal: No acute or suspicious abnormality. Review of the MIP images confirms the above findings IMPRESSION: No evidence of pulmonary emboli. Cardiomegaly with ectatic ascending thoracic aorta. Small bilateral pleural effusions and moderate bilateral lower  lobe atelectasis. Electronically Signed   By: Margarette Canada M.D.   On: 02/09/2016 18:31   Mr Cervical Spine Wo Contrast  Result Date: 02/10/2016 CLINICAL DATA:  65 y/o  F; chest and back pain starting at 5 a.m. EXAM: MRI CERVICAL SPINE WITHOUT CONTRAST TECHNIQUE: Multiplanar, multisequence MR imaging of the cervical spine was performed. No intravenous contrast was administered. COMPARISON:  07/13/2008 cervical MRI. FINDINGS: Alignment: Straightening of cervical lordosis.  No listhesis. Vertebrae: No fracture, evidence of discitis, or bone lesion. Mild degenerative edema within the C4-5 endplates and left facets. Cord: Normal cord signal. Anterior cord impingement at C4-5 and C5-6. Posterior Fossa, vertebral arteries, paraspinal tissues: Negative. Disc levels: Multilevel disc desiccation and endplate degenerative changes greatest at C4-5 but pronounced from C4 through C7. C2-3: No significant disc displacement, foraminal narrowing, or canal stenosis. C3-4: Small disc osteophyte complex and left-greater-than-right uncovertebral/facet hypertrophy. Mild left-sided foraminal narrowing. Mild canal stenosis. C4-5: Moderate disc osteophyte complex with ligamentum flavum  hypertrophy in extensive bilateral uncovertebral and facet hypertrophy. Moderate to severe bilateral foraminal narrowing. Severe canal stenosis with cord impingement and flattening. C5-6: Moderate disc osteophyte complex eccentric to the left subarticular and foraminal zones and extensive uncovertebral and facet hypertrophy. Central annular fissure. Moderate to severe right and severe left foraminal narrowing. Moderate canal stenosis with anterior cord impingement greater on the left and anterior cord flattening. C6-7: Moderate disc osteophyte complex and bilateral uncovertebral/ facet hypertrophy. Mild right and moderate left foraminal narrowing. Moderate canal stenosis. C7-T1: Small disc osteophyte complex and left-greater-than-right uncovertebral/ facet  hypertrophy. Mild right and moderate left foraminal narrowing. Mild canal stenosis. IMPRESSION: Extensive cervical spondylosis greatest at the C4-5 with there is severe canal stenosis and cord impingement. Additionally there is moderate to severe canal stenosis at the C5-6 level will left-greater-than-right anterior cord impingement. Foraminal narrowing is moderate to severe from C4-C6 as well. Degenerative changes are mildly progressed from 2010. Electronically Signed   By: Kristine Garbe M.D.   On: 02/10/2016 02:44   Dg Chest Portable 1 View  Result Date: 02/09/2016 CLINICAL DATA:  Chest pain EXAM: PORTABLE CHEST 1 VIEW COMPARISON:  03/24/2013 chest radiograph. FINDINGS: Low lung volumes. Stable cardiomediastinal silhouette with normal heart size. No pneumothorax. No pleural effusion. No pulmonary edema. Mild bibasilar hazy opacities. Cholecystectomy clips are seen in the right upper quadrant of the abdomen. IMPRESSION: Low lung volumes with mild hazy bibasilar lung opacities, favor atelectasis. Electronically Signed   By: Ilona Sorrel M.D.   On: 02/09/2016 11:51      [place] M.D on 02/10/2016 at 2:18 PM  Between 7am to 7pm - Pager - [phone]  After 7pm go to [email or web address] - password [facility]  [facility] -  Office  [phone]

## 2016-02-10 NOTE — Progress Notes (Signed)
Patient diaphoretic this morning and feels hot.  Upon check, her temp is 100.8 F axillary, BP 92/58, HR 102, 89% O2 sat on RA and placed on 2L.  Also concurrently received a call from telemetry that she had 10 beats of SVT.  Right radial cath site unremarkable.  Notified provider and administered 650 mg Tylenol for fever.  Orders placed for blood cultures and a bolus NS.  Will continue to monitor patient.  Arva Chafe

## 2016-02-10 NOTE — Evaluation (Addendum)
Physical Therapy Evaluation Patient Details Name: Sydney Wolfe MRN: [id number] DOB: 04-Nov-1950 Today's Date: 02/10/2016   History of Present Illness  65 yo admitted with acute onset chest and shoulder pain. (-) PE, clear cardiac cath, MRI with C4-6 stenosis and impingement. PMHx: RA, HTN, HLD, [name]  Clinical Impression  Pt pleasant but reports fatigue throughout session. Pt diaphoretic throughout with report of chest soreness through left shoulder making deep breaths difficulty. Educated pt for use of incentive spirometer with pt having difficulty getting to 500cc. Pt with bilateral upper trapezius tightness but no noted trigger points or spasms at this time within trapezius or pectoralis. Pt with decreased activity tolerance, function and cardiopulmonary status who will benefit from acute therapy to increase mobility and decrease burden of care. Pt educated for cervical precautions due to cervical stenosis and pain as well as encouraged workplace assessment. Will continue to follow  Pt with sats 80% on RA with ambulation and maintained at rest. Required 5L of O2 to achieve 91% with pursed lip breathing and IS use.     Follow Up Recommendations Home health PT    Equipment Recommendations  None recommended by PT    Recommendations for Other Services       Precautions / Restrictions Precautions Precautions: Fall Precaution Comments: watch O2 Restrictions Weight Bearing Restrictions: No RUE Weight Bearing: Non weight bearing      Mobility  Bed Mobility Overal bed mobility: Modified Independent             General bed mobility comments: increased time with use of rail and cues for sequence to maintain back precautions for comfort  Transfers Overall transfer level: Needs assistance   Transfers: Sit to/from Stand Sit to Stand: Min guard         General transfer comment: pt with flat affect, difficulty maintaining eyes open and guarding for  safety  Ambulation/Gait Ambulation/Gait assistance: Min assist Ambulation Distance (Feet): 60 Feet Assistive device: None Gait Pattern/deviations: Step-through pattern;Decreased stride length   Gait velocity interpretation: Below normal speed for age/gender General Gait Details: pt with slow gait with report of SOB, saturation 80% on RA when checked with 40' ambulation. Pt educated for pursed lip breathing without improvement with return to room and administration of O2 with Rn present. pt with min assist for balance due to LOB with turn to return to room  Stairs            Wheelchair Mobility    Modified Rankin (Stroke Patients Only)       Balance Overall balance assessment: Needs assistance   Sitting balance-Leahy Scale: Normal       Standing balance-Leahy Scale: Good                               Pertinent Vitals/Pain Pain Assessment: 0-10 Pain Score: 4  Pain Location: left shoulder and back Pain Descriptors / Indicators: Sore Pain Intervention(s): Limited activity within patient's tolerance;Monitored during session;Repositioned    Home Living Family/patient expects to be discharged to:: Private residence Living Arrangements: Spouse/significant other Available Help at Discharge: Family;Available 24 hours/day Type of Home: House Home Access: Stairs to enter   [employer] of Steps: 2 Home Layout: One level Home Equipment: None      Prior Function Level of Independence: Independent               Hand Dominance        Extremity/Trunk Assessment  Upper Extremity Assessment: Overall WFL for tasks assessed           Lower Extremity Assessment: Overall WFL for tasks assessed      Cervical / Trunk Assessment: Normal  Communication   Communication: No difficulties  Cognition Arousal/Alertness: Awake/alert Behavior During Therapy: Flat affect Overall Cognitive Status: Within Functional Limits for tasks assessed                       General Comments      Exercises     Assessment/Plan    PT Assessment Patient needs continued PT services  PT Problem List Decreased activity tolerance;Cardiopulmonary status limiting activity;Decreased mobility          PT Treatment Interventions Gait training;Stair training;Functional mobility training;Patient/family education;Therapeutic activities;Therapeutic exercise    PT Goals (Current goals can be found in the Care Plan section)  Acute Rehab PT Goals Patient Stated Goal: be able to return home PT Goal Formulation: With patient/family Time For Goal Achievement: 02/24/16 Potential to Achieve Goals: Good    Frequency Min 3X/week   Barriers to discharge        Co-evaluation               End of Session Equipment Utilized During Treatment: Oxygen Activity Tolerance: Patient limited by fatigue Patient left: in chair;with call bell/phone within reach;with family/visitor present Nurse Communication: Mobility status;Precautions;Other (comment) (decreased O2 sats)    Functional Assessment Tool Used: clinical judgement Functional Limitation: Mobility: Walking and moving around Mobility: Walking and Moving Around Current Status [phone]): At least 1 percent but less than 20 percent impaired, limited or restricted Mobility: Walking and Moving Around Goal Status [phone]): 0 percent impaired, limited or restricted    Time: 1325-1402 PT Time Calculation (min) (ACUTE ONLY): 37 min   Charges:   PT Evaluation $PT Eval Moderate Complexity: 1 Procedure     PT G Codes:   PT G-Codes **NOT FOR INPATIENT CLASS** Functional Assessment Tool Used: clinical judgement Functional Limitation: Mobility: Walking and moving around Mobility: Walking and Moving Around Current Status ([id number]): At least 1 percent but less than 20 percent impaired, limited or restricted Mobility: Walking and Moving Around Goal Status [phone]): 0 percent impaired, limited or  restricted    Delorse Lek 02/10/2016, 2:20 PM Delaney Meigs, PT [phone]

## 2016-02-10 NOTE — Progress Notes (Addendum)
SUBJECTIVE: The patient is doing well today.  Cardiac cath without obstructive CAD, CTPA negative for PE.  Continues to have chest pain worse with deep breathing.  . angioplasty book   Does not apply Once  . aspirin  81 mg Oral Daily  . carvedilol  6.25 mg Oral BID WC  . levothyroxine  50 mcg Oral QAC breakfast  . rosuvastatin  20 mg Oral Daily  . sodium chloride flush  3 mL Intravenous Q12H  . timolol  1 drop Both Eyes BID      OBJECTIVE: Physical Exam: Vitals:   02/09/16 2257 02/10/16 0259 02/10/16 0349 02/10/16 0355  BP: 125/75 (!) 92/58  98/65  Pulse: (!) 102   96  Resp: (!) 22 20  20   Temp: 98 F (36.7 C) (!) 100.8 F (38.2 C) 99.3 F (37.4 C) 97.4 F (36.3 C)  TempSrc: Oral Axillary Oral Oral  SpO2: 93% 93%  96%  Weight:      Height:        Intake/Output Summary (Last 24 hours) at 02/10/16 0948 Last data filed at 02/10/16 0700  Gross per 24 hour  Intake           749.86 ml  Output              220 ml  Net           529.86 ml    Telemetry reveals sinus rhythm, short run of SVT  GEN- The patient is well appearing, alert and oriented x 3 today.   Head- normocephalic, atraumatic Eyes-  Sclera clear, conjunctiva pink Ears- hearing intact Oropharynx- clear Neck- supple, no JVP Lymph- no cervical lymphadenopathy Lungs- Clear to ausculation bilaterally, normal work of breathing Heart- Regular rate and rhythm, no murmurs, rubs or gallops, PMI not laterally displaced GI- soft, NT, ND, + BS Extremities- no clubbing, cyanosis, or edema Skin- no rash or lesion Psych- euthymic mood, full affect Neuro- strength and sensation are intact  LABS: Basic Metabolic Panel:  Recent Labs  02/12/16 1123 02/09/16 1133 02/09/16 1605 02/10/16 0240  NA 136 138  --  135  K 3.4* 3.4*  --  3.7  CL 99* 99*  --  102  CO2 26  --   --  24  GLUCOSE 137* 132*  --  201*  BUN 11 12  --  16  CREATININE 0.80 0.70 0.78 1.07*  CALCIUM 9.5  --   --  8.9   Liver Function  Tests:  Recent Labs  02/10/16 0240  AST 66*  ALT 45  ALKPHOS 52  BILITOT 1.7*  PROT 7.2  ALBUMIN 2.9*   No results for input(s): LIPASE, AMYLASE in the last 72 hours. CBC:  Recent Labs  02/09/16 1605 02/10/16 0240  WBC 13.0* 13.6*  NEUTROABS  --  9.2*  HGB 13.5 13.4  HCT 41.4 40.7  MCV 96.5 96.7  PLT 202 205   Cardiac Enzymes:  Recent Labs  02/09/16 1605 02/09/16 2011 02/09/16 2017 02/10/16 0240  CKTOTAL  --   --  29*  --   TROPONINI <0.03 <0.03  --  0.03*   BNP: Invalid input(s): POCBNP D-Dimer:  Recent Labs  02/09/16 1313  DDIMER 2.75*   Hemoglobin A1C: No results for input(s): HGBA1C in the last 72 hours. Fasting Lipid Panel:  Recent Labs  02/10/16 0240  CHOL 92  HDL 33*  LDLCALC 47  TRIG 60  CHOLHDL 2.8   Thyroid Function Tests:  Recent Labs  02/09/16 1605  TSH 2.228   Anemia Panel: No results for input(s): VITAMINB12, FOLATE, FERRITIN, TIBC, IRON, RETICCTPCT in the last 72 hours.  RADIOLOGY: Ct Angio Chest Pe W Or Wo Contrast  Result Date: 02/09/2016 CLINICAL DATA:  65 year old female with acute chest pain and elevated D-dimer. EXAM: CT ANGIOGRAPHY CHEST WITH CONTRAST TECHNIQUE: Multidetector CT imaging through the chest was performed using the standard protocol during bolus administration of intravenous contrast. Multiplanar reconstructed images and MIPs were obtained and reviewed to evaluate the vascular anatomy. CONTRAST:  100 cc intravenous Isovue 370 COMPARISON:  02/09/2016 chest radiograph and 04/21/2012 chest CT FINDINGS: CTA CHEST FINDINGS Cardiovascular: This is a technically satisfactory study. No pulmonary emboli are identified. Cardiomegaly identified. Ectatic ascending thoracic aorta noted measuring 3.7 cm in greatest diameter. Mediastinum/Nodes: No mediastinal mass, enlarged lymph nodes or pericardial effusion. Lungs/Pleura: Small bilateral pleural effusions and moderate bilateral lower lobe atelectasis identified. No definite  airspace disease, nodule, mass or endobronchial/ endotracheal lesion. There is no evidence of pneumothorax. Upper Abdomen: No acute abnormality. Musculoskeletal: No acute or suspicious abnormality. Review of the MIP images confirms the above findings IMPRESSION: No evidence of pulmonary emboli. Cardiomegaly with ectatic ascending thoracic aorta. Small bilateral pleural effusions and moderate bilateral lower lobe atelectasis. Electronically Signed   By: Harmon Pier M.D.   On: 02/09/2016 18:31   Mr Cervical Spine Wo Contrast  Result Date: 02/10/2016 CLINICAL DATA:  65 y/o  F; chest and back pain starting at 5 a.m. EXAM: MRI CERVICAL SPINE WITHOUT CONTRAST TECHNIQUE: Multiplanar, multisequence MR imaging of the cervical spine was performed. No intravenous contrast was administered. COMPARISON:  07/13/2008 cervical MRI. FINDINGS: Alignment: Straightening of cervical lordosis.  No listhesis. Vertebrae: No fracture, evidence of discitis, or bone lesion. Mild degenerative edema within the C4-5 endplates and left facets. Cord: Normal cord signal. Anterior cord impingement at C4-5 and C5-6. Posterior Fossa, vertebral arteries, paraspinal tissues: Negative. Disc levels: Multilevel disc desiccation and endplate degenerative changes greatest at C4-5 but pronounced from C4 through C7. C2-3: No significant disc displacement, foraminal narrowing, or canal stenosis. C3-4: Small disc osteophyte complex and left-greater-than-right uncovertebral/facet hypertrophy. Mild left-sided foraminal narrowing. Mild canal stenosis. C4-5: Moderate disc osteophyte complex with ligamentum flavum hypertrophy in extensive bilateral uncovertebral and facet hypertrophy. Moderate to severe bilateral foraminal narrowing. Severe canal stenosis with cord impingement and flattening. C5-6: Moderate disc osteophyte complex eccentric to the left subarticular and foraminal zones and extensive uncovertebral and facet hypertrophy. Central annular fissure.  Moderate to severe right and severe left foraminal narrowing. Moderate canal stenosis with anterior cord impingement greater on the left and anterior cord flattening. C6-7: Moderate disc osteophyte complex and bilateral uncovertebral/ facet hypertrophy. Mild right and moderate left foraminal narrowing. Moderate canal stenosis. C7-T1: Small disc osteophyte complex and left-greater-than-right uncovertebral/ facet hypertrophy. Mild right and moderate left foraminal narrowing. Mild canal stenosis. IMPRESSION: Extensive cervical spondylosis greatest at the C4-5 with there is severe canal stenosis and cord impingement. Additionally there is moderate to severe canal stenosis at the C5-6 level Halsey Persaud left-greater-than-right anterior cord impingement. Foraminal narrowing is moderate to severe from C4-C6 as well. Degenerative changes are mildly progressed from 2010. Electronically Signed   By: Mitzi Hansen M.D.   On: 02/10/2016 02:44   Dg Chest Portable 1 View  Result Date: 02/09/2016 CLINICAL DATA:  Chest pain EXAM: PORTABLE CHEST 1 VIEW COMPARISON:  03/24/2013 chest radiograph. FINDINGS: Low lung volumes. Stable cardiomediastinal silhouette with normal heart size. No pneumothorax. No pleural effusion. No pulmonary edema. Mild  bibasilar hazy opacities. Cholecystectomy clips are seen in the right upper quadrant of the abdomen. IMPRESSION: Low lung volumes with mild hazy bibasilar lung opacities, favor atelectasis. Electronically Signed   By: Delbert Phenix M.D.   On: 02/09/2016 11:51    ASSESSMENT AND PLAN:  Active Problems:   Chest pain, mid sternal   Essential hypertension   Abnormal ECG   Rheumatoid arthritis (HCC) 1.  Chest pain - unlikely cardiac in nature as had normal cath.  Likely musculoskeletal as having pain when taking a deep breath.  Per IM.   2.  Chronic [name]  3.  HTN - BP controlled on current meds.  Continue BB.  4.  Hyperlipidemia - continue statin and check FLP and ALT in  am.  5.  RA on Remicaide/naproxen.  6.  Erythrocytosis ? etiology  Aviya Jarvie Jorja Loa, MD 02/10/2016 9:48 AM

## 2016-02-10 NOTE — Progress Notes (Signed)
MD text paged-family requesting an update.

## 2016-02-11 LAB — BASIC METABOLIC PANEL
ANION GAP: 7 (ref 5–15)
BUN: 16 mg/dL (ref 6–20)
CALCIUM: 8.9 mg/dL (ref 8.9–10.3)
CO2: 23 mmol/L (ref 22–32)
Chloride: 101 mmol/L (ref 101–111)
Creatinine, Ser: 0.63 mg/dL (ref 0.44–1.00)
Glucose, Bld: 202 mg/dL — ABNORMAL HIGH (ref 65–99)
POTASSIUM: 4.1 mmol/L (ref 3.5–5.1)
Sodium: 131 mmol/L — ABNORMAL LOW (ref 135–145)

## 2016-02-11 LAB — CBC
HCT: 35.9 % — ABNORMAL LOW (ref 36.0–46.0)
Hemoglobin: 11.8 g/dL — ABNORMAL LOW (ref 12.0–15.0)
MCH: 31.7 pg (ref 26.0–34.0)
MCHC: 32.9 g/dL (ref 30.0–36.0)
MCV: 96.5 fL (ref 78.0–100.0)
PLATELETS: 137 10*3/uL — AB (ref 150–400)
RBC: 3.72 MIL/uL — AB (ref 3.87–5.11)
RDW: 12.8 % (ref 11.5–15.5)
WBC: 16.8 10*3/uL — AB (ref 4.0–10.5)

## 2016-02-11 LAB — RHEUMATOID FACTOR: Rhuematoid fact SerPl-aCnc: 13.8 IU/mL (ref 0.0–13.9)

## 2016-02-11 LAB — URINE CULTURE

## 2016-02-11 MED ORDER — FUROSEMIDE 10 MG/ML IJ SOLN
40.0000 mg | Freq: Once | INTRAMUSCULAR | Status: AC
  Administered 2016-02-11: 40 mg via INTRAVENOUS
  Filled 2016-02-11: qty 4

## 2016-02-11 MED ORDER — FUROSEMIDE 20 MG PO TABS: ORAL_TABLET | ORAL | 0 refills | Status: DC

## 2016-02-11 MED ORDER — CIPROFLOXACIN HCL 500 MG PO TABS: 500.0000 mg | ORAL_TABLET | Freq: Two times a day (BID) | ORAL | 0 refills | Status: AC

## 2016-02-11 MED ORDER — PREDNISONE 10 MG PO TABS: ORAL_TABLET | ORAL | 0 refills | Status: DC

## 2016-02-11 MED ORDER — CIPROFLOXACIN HCL 500 MG PO TABS: 500.0000 mg | ORAL_TABLET | Freq: Two times a day (BID) | ORAL | 0 refills | Status: DC

## 2016-02-11 MED ORDER — PANTOPRAZOLE SODIUM 40 MG PO TBEC
40.0000 mg | DELAYED_RELEASE_TABLET | Freq: Every day | ORAL | Status: DC
  Administered 2016-02-11: 40 mg via ORAL
  Filled 2016-02-11 (×2): qty 1

## 2016-02-11 NOTE — Discharge Summary (Signed)
Sydney Wolfe, is a 65 y.o. female  DOB 07-23-1950  MRN [id number].  Admission date:  02/09/2016  Admitting Physician  Jettie Booze, MD  Discharge Date:  02/11/2016   Primary MD  Chesley Noon, MD  Recommendations for primary care physician for things to follow:  - Please check CBC, BMP your next visit, family instructed to schedule an appointment with PCP this Friday, especially since started on diuresis, adjust Lasix dose as needed. - May consider starting Cymbalta given patient chronic pain, would consider trial of stopping statin as well in the outpatient setting if seemed appropriate by PCP. - Follow with neurosurgery as an outpatient setting for cervical radiculopathy. - Would recommend GI referral if patient continues to have dysphagia problem (currently reports this has improved during hospital stay).   Admission Diagnosis  Nonspecific chest pain [R07.9]   Discharge Diagnosis  Nonspecific chest pain [R07.9]    Active Problems:   Chest pain, mid sternal   Essential hypertension   Abnormal ECG   Chest pain   Rheumatoid arthritis (HCC)   Dysphagia   Neck pain      Past Medical History:  Diagnosis Date  . Arthritis   . Gout   . Heart murmur   . 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 Hot flashes   . Hyperlipidemia   . Hypertension   . Hypothyroidism   . Normal nuclear stress test 2004  . OA (osteoarthritis)   . Obesity   . [name] (right bundle branch block)     Past Surgical History:  Procedure Laterality Date  . CARDIAC CATHETERIZATION N/A 02/09/2016   Procedure: Left Heart Cath and Coronary Angiography;  Surgeon: Jettie Booze, MD;  Location: [facility];  Service: Cardiovascular;  Laterality: N/A;  . CARDIOVASCULAR STRESS TEST  08/20/2002   EF 76%  . CARPAL TUNNEL RELEASE    . CHOLECYSTECTOMY  1987  . DILATION AND  CURETTAGE OF UTERUS  multiple  . HAND SURGERY  1987 and 1988   bilateral  . SPINE SURGERY  08/2008   T2  . TUBAL LIGATION  1975  . US ECHOCARDIOGRAPHY  07/25/2004   EF 55-60%       History of present illness and  Hospital Course:     Kindly see H&P for history of present illness and admission details, please review complete Labs, Consult reports and Test reports for all details in brief  HPI  from the history and physical done on the day of admission 02/09/2016  HPI: This is [name] with a history of RA, Hyperlipidemia, HTN who is followed by Dr. Acie Fredrickson for chronic [name] and prior history of CP with normal stress myoview and normal cardiac MRI in 2014.  She recently saw him for complaints of DOE and chest pain when climbing stairs and other exertional activities.  She described it as a weight on her chest.  She underwent nuclear stress test that was low risk  with fixed apical defect.  Since then she has continued to have DOE.  Her and her husband were out yesterday and she developed pain across the back of her neck.  This was intermittent in severity throughout the day but never went completely away and radiated into her chest.  She was able to sleep last night but this am when she awakened she had sudden onset of severe squeezing pain in her left chest with radiation into her jaw and left arm heaviness.  She came to [facility].  In ER she is very uncomfortable complaining of severe CP somewhat worse with palpation of her chest wall and somewhat worse with inspiration.  Her initial O2 sats on RA were 97%.  She denies any recent long distance travel (last trip to [place] 11/28/2015).  She has a strong family history of CAD with her dad having an MI and her brother died of an MI.  Cardiology is now asked to evaluate.   Hospital Course  65 y.o.femalewith medical history significant of HTN, rheumatoid arthritis, [name], Admitted with complaints of chest pain, seen by cardiology, status post cardiac  cath which did show normal coronaries, has elevated D dimers, CTA chest negative for PE, [facility] hospitalist consulted for noncardiac chest pain.   Chest pain - appears to be musculoskeletal, cardiac cath with no Significant coronary artery disease, CTA chest negative for PE. - Unclear to his chest pain is related to RA flare, but this is not typical for her previous flares  Rheumatoid  arthritis with possible flare - Patient with elevated CRP and ESR, no baseline to compare to, reports improvement on one dose of IV steroids, so will start on prednisone taper .  Dysphagia - Patient reports it is improving, recommendation for outpatient evaluation if persists.  Fever/UTI - Possibly related to atelectasis versus UTI, treated with Rocephin during hospital stay, is another days of oral ciprofloxacin as an outpatient, encouraged to continue using incentive spirometry.  Acute hypoxic respiratory failure - Secondary to atelectasis and diastolic CHF - Patient reports progressive dyspnea over last few weeks, CT chest significant for atelectasis and small pleural effusion, improved with incentive spirometry , is to continue to use as an outpatient, as well evidence of small pleural effusion on CT chest, so started on low dose Lasix on discharge especially with evidence of grade 1 diastolic CHF on 2-D echo . - Resolved, oxygen saturation 91% on ambulation - Has a follow-up appointment with Dr. Melvyn Novas this Thursday  Cervical radiculopathy - MRI significant for radiculopathy, discussed with Dr. Cyndy Freeze, this can be followed as an outpatient     Discharge Condition:  Stable   Follow UP  Follow-up Information    BADGER,MICHAEL C, MD Follow up in 1 week(s).   Specialty:  Family Medicine Contact information: [place] [id number] [phone]             Discharge Instructions  and  Discharge Medications     Discharge Instructions    Diet - low sodium heart  healthy    Complete by:  As directed    Discharge instructions    Complete by:  As directed    Follow with Primary MD Chesley Noon, MD in 7 days   Get CBC, CMP, 2 view Chest X ray checked  by Primary MD next visit.    Activity: As tolerated with Full fall precautions use walker/cane & assistance as needed   Disposition Home    Diet: Heart Healthy ,  with feeding assistance and aspiration precautions.  For Heart failure patients - Check your Weight same time everyday, if you gain over 2 pounds, or you develop in leg swelling, experience more shortness of breath or chest pain, call your Primary MD immediately. Follow Cardiac Low Salt Diet and 1.5 lit/day fluid restriction.   On your next visit with your primary care physician please Get Medicines reviewed and adjusted.   Please request your Prim.MD to go over all Hospital Tests and Procedure/Radiological results at the follow up, please get all Hospital records sent to your Prim MD by signing hospital release before you go home.   If you experience worsening of your admission symptoms, develop shortness of breath, life threatening emergency, suicidal or homicidal thoughts you must seek medical attention immediately by calling 911 or calling your MD immediately  if symptoms less severe.  You Must read complete instructions/literature along with all the possible adverse reactions/side effects for all the Medicines you take and that have been prescribed to you. Take any new Medicines after you have completely understood and accpet all the possible adverse reactions/side effects.   Do not drive, operating heavy machinery, perform activities at heights, swimming or participation in water activities or provide baby sitting services if your were admitted for syncope or siezures until you have seen by Primary MD or a Neurologist and advised to do so again.  Do not drive when taking Pain medications.    Do not take more than prescribed  Pain, Sleep and Anxiety Medications  Special Instructions: If you have smoked or chewed Tobacco  in the last 2 yrs please stop smoking, stop any regular Alcohol  and or any Recreational drug use.  Wear Seat belts while driving.   Please note  You were cared for by a hospitalist during your hospital stay. If you have any questions about your discharge medications or the care you received while you were in the hospital after you are discharged, you can call the unit and asked to speak with the hospitalist on call if the hospitalist that took care of you is not available. Once you are discharged, your primary care physician will handle any further medical issues. Please note that NO REFILLS for any discharge medications will be authorized once you are discharged, as it is imperative that you return to your primary care physician (or establish a relationship with a primary care physician if you do not have one) for your aftercare needs so that they can reassess your need for medications and monitor your lab values.   Increase activity slowly    Complete by:  As directed        Medication List    STOP taking these medications   hydrochlorothiazide 25 MG tablet Commonly known as:  HYDRODIURIL     TAKE these medications   aspirin 81 MG tablet Take 81 mg by mouth daily.   BETIMOL 0.5 % ophthalmic solution Generic drug:  timolol Apply 1 drop to eye 2 (two) times daily.   carvedilol 6.25 MG tablet Commonly known as:  COREG TAKE 1 TABLET(6.25 MG) BY MOUTH TWICE DAILY   ciprofloxacin 500 MG tablet Commonly known as:  CIPRO Take 1 tablet (500 mg total) by mouth 2 (two) times daily. Start taking on:  02/12/2016   furosemide 20 MG tablet Commonly known as:  LASIX History 40 mg oral daily for next 3 days, then change to 20 mg oral daily from 9/21   inFLIXimab 100 MG injection Commonly known  as:  REMICADE Inject 100 mg into the vein every 6 (six) weeks.   levothyroxine 50 MCG  tablet Commonly known as:  SYNTHROID, LEVOTHROID Take 1 tablet by mouth daily.   meloxicam 15 MG tablet Commonly known as:  MOBIC Take 15 mg by mouth daily.   naproxen 500 MG tablet Commonly known as:  NAPROSYN Take 1 tablet by mouth 2 (two) times daily.   nitroGLYCERIN 0.4 MG SL tablet Commonly known as:  NITROSTAT Place 1 tablet (0.4 mg total) under the tongue every 5 (five) minutes as needed for chest pain.   predniSONE 10 MG tablet Commonly known as:  DELTASONE Please take 30 mg oral daily for 3 days, then 20 mg oral daily for 3 days, then 10 mg oral daily for 3 days, then stop.   rosuvastatin 5 MG tablet Commonly known as:  CRESTOR Takes 1 tablet daily   VITAMIN B-12 PO Take 1 tablet by mouth daily.         Diet and Activity recommendation: See Discharge Instructions above   Consults obtained -  Cardiology   Major procedures and Radiology Reports - PLEASE review detailed and final reports for all details, in brief -   Cardiac cath 9/15  The left ventricular systolic function is normal.  The left ventricular ejection fraction is 55-65% by visual estimate.  There is no aortic valve stenosis.  LV end diastolic pressure is normal.  No angiographically apparent coronary artery disease.  No evidence of thoracic aortic dissection.     Ct Angio Chest Pe W Or Wo Contrast  Result Date: 02/09/2016 CLINICAL DATA:  65 year old female with acute chest pain and elevated D-dimer. EXAM: CT ANGIOGRAPHY CHEST WITH CONTRAST TECHNIQUE: Multidetector CT imaging through the chest was performed using the standard protocol during bolus administration of intravenous contrast. Multiplanar reconstructed images and MIPs were obtained and reviewed to evaluate the vascular anatomy. CONTRAST:  100 cc intravenous Isovue 370 COMPARISON:  02/09/2016 chest radiograph and 04/21/2012 chest CT FINDINGS: CTA CHEST FINDINGS Cardiovascular: This is a technically satisfactory study. No pulmonary  emboli are identified. Cardiomegaly identified. Ectatic ascending thoracic aorta noted measuring 3.7 cm in greatest diameter. Mediastinum/Nodes: No mediastinal mass, enlarged lymph nodes or pericardial effusion. Lungs/Pleura: Small bilateral pleural effusions and moderate bilateral lower lobe atelectasis identified. No definite airspace disease, nodule, mass or endobronchial/ endotracheal lesion. There is no evidence of pneumothorax. Upper Abdomen: No acute abnormality. Musculoskeletal: No acute or suspicious abnormality. Review of the MIP images confirms the above findings IMPRESSION: No evidence of pulmonary emboli. Cardiomegaly with ectatic ascending thoracic aorta. Small bilateral pleural effusions and moderate bilateral lower lobe atelectasis. Electronically Signed   By: Margarette Canada M.D.   On: 02/09/2016 18:31   Mr Cervical Spine Wo Contrast  Result Date: 02/10/2016 CLINICAL DATA:  65 y/o  F; chest and back pain starting at 5 a.m. EXAM: MRI CERVICAL SPINE WITHOUT CONTRAST TECHNIQUE: Multiplanar, multisequence MR imaging of the cervical spine was performed. No intravenous contrast was administered. COMPARISON:  07/13/2008 cervical MRI. FINDINGS: Alignment: Straightening of cervical lordosis.  No listhesis. Vertebrae: No fracture, evidence of discitis, or bone lesion. Mild degenerative edema within the C4-5 endplates and left facets. Cord: Normal cord signal. Anterior cord impingement at C4-5 and C5-6. Posterior Fossa, vertebral arteries, paraspinal tissues: Negative. Disc levels: Multilevel disc desiccation and endplate degenerative changes greatest at C4-5 but pronounced from C4 through C7. C2-3: No significant disc displacement, foraminal narrowing, or canal stenosis. C3-4: Small disc osteophyte complex and left-greater-than-right uncovertebral/facet  hypertrophy. Mild left-sided foraminal narrowing. Mild canal stenosis. C4-5: Moderate disc osteophyte complex with ligamentum flavum hypertrophy in extensive  bilateral uncovertebral and facet hypertrophy. Moderate to severe bilateral foraminal narrowing. Severe canal stenosis with cord impingement and flattening. C5-6: Moderate disc osteophyte complex eccentric to the left subarticular and foraminal zones and extensive uncovertebral and facet hypertrophy. Central annular fissure. Moderate to severe right and severe left foraminal narrowing. Moderate canal stenosis with anterior cord impingement greater on the left and anterior cord flattening. C6-7: Moderate disc osteophyte complex and bilateral uncovertebral/ facet hypertrophy. Mild right and moderate left foraminal narrowing. Moderate canal stenosis. C7-T1: Small disc osteophyte complex and left-greater-than-right uncovertebral/ facet hypertrophy. Mild right and moderate left foraminal narrowing. Mild canal stenosis. IMPRESSION: Extensive cervical spondylosis greatest at the C4-5 with there is severe canal stenosis and cord impingement. Additionally there is moderate to severe canal stenosis at the C5-6 level will left-greater-than-right anterior cord impingement. Foraminal narrowing is moderate to severe from C4-C6 as well. Degenerative changes are mildly progressed from 2010. Electronically Signed   By: Kristine Garbe M.D.   On: 02/10/2016 02:44   Dg Chest Portable 1 View  Result Date: 02/09/2016 CLINICAL DATA:  Chest pain EXAM: PORTABLE CHEST 1 VIEW COMPARISON:  03/24/2013 chest radiograph. FINDINGS: Low lung volumes. Stable cardiomediastinal silhouette with normal heart size. No pneumothorax. No pleural effusion. No pulmonary edema. Mild bibasilar hazy opacities. Cholecystectomy clips are seen in the right upper quadrant of the abdomen. IMPRESSION: Low lung volumes with mild hazy bibasilar lung opacities, favor atelectasis. Electronically Signed   By: Ilona Sorrel M.D.   On: 02/09/2016 11:51    Micro Results     Recent Results (from the past 240 hour(s))  Culture, blood (single)     Status:  None (Preliminary result)   Collection Time: 02/10/16  3:37 AM  Result Value Ref Range Status   Specimen Description BLOOD LEFT HAND  Final   Special Requests BOTTLES DRAWN AEROBIC AND ANAEROBIC 10ML  Final   Culture NO GROWTH 1 DAY  Final   Report Status PENDING  Incomplete  Urine culture     Status: Abnormal   Collection Time: 02/10/16 12:25 PM  Result Value Ref Range Status   Specimen Description URINE, RANDOM  Final   Special Requests NONE  Final   Culture MULTIPLE SPECIES PRESENT, SUGGEST RECOLLECTION (A)  Final   Report Status 02/11/2016 FINAL  Final       Today   Subjective:   Sydney Wolfe today has no headache,no chest or  abdominal pain,Warts or muscular skeletal chest pain is improving, as well left shoulder blade and neck pain is improving .   Objective:   Blood pressure 130/72, pulse 63, temperature 97.5 F (36.4 C), temperature source Oral, resp. rate 18, height 5' 10"  (1.778 m), weight 90.7 kg (200 lb), SpO2 97 %.   Intake/Output Summary (Last 24 hours) at 02/11/16 1256 Last data filed at 02/11/16 1000  Gross per 24 hour  Intake             1275 ml  Output             1900 ml  Net             -625 ml    Exam Awake Alert, Oriented x 3, No new F.N deficits, Normal affect [place].AT,PERRAL Supple Neck,No JVD, No cervical lymphadenopathy appriciated.  Symmetrical Chest wall movement, Fair air movement bilaterally, by basilar rales secondary to atelectasis RRR,No Gallops,Rubs or new Murmurs,  No Parasternal Heave +ve B.Sounds, Abd Soft, Non tender, No organomegaly appriciated, No rebound -guarding or rigidity. No Cyanosis, Clubbing or edema, No new Rash or bruise  Data Review   CBC w Diff: Lab Results  Component Value Date   WBC 16.8 (H) 02/11/2016   HGB 11.8 (L) 02/11/2016   HCT 35.9 (L) 02/11/2016   PLT 137 (L) 02/11/2016   LYMPHOPCT 21 02/10/2016   MONOPCT 11 02/10/2016   EOSPCT 0 02/10/2016   BASOPCT 0 02/10/2016    CMP: Lab Results  Component  Value Date   NA 131 (L) 02/11/2016   NA 145 (H) 10/04/2013   K 4.1 02/11/2016   CL 101 02/11/2016   CO2 23 02/11/2016   BUN 16 02/11/2016   BUN 17 10/04/2013   CREATININE 0.63 02/11/2016   CREATININE 0.80 12/14/2015   PROT 7.2 02/10/2016   PROT 6.4 10/04/2013   ALBUMIN 2.9 (L) 02/10/2016   ALBUMIN 4.1 10/04/2013   BILITOT 1.7 (H) 02/10/2016   ALKPHOS 52 02/10/2016   AST 66 (H) 02/10/2016   ALT 45 02/10/2016  .   Total Time in preparing paper work, data evaluation and todays exam - 35 minutes  Shyne Resch M.D on 02/11/2016 at 12:56 PM  [facility]   Office  [phone]

## 2016-02-11 NOTE — Evaluation (Addendum)
Occupational Therapy Evaluation/Discharge Patient Details Name: NECIE WILCOXSON MRN: [id number] DOB: [birth year] Today's Date: 02/11/2016    History of Present Illness 65 yo admitted with acute onset chest and shoulder pain. (-) PE, clear cardiac cath, MRI with C4-6 stenosis and impingement. PMHx: RA, HTN, HLD, [name]   Clinical Impression   Patient has been evaluated by Occupational Therapy with no acute OT needs identified. Pt able to complete all ADLs and basic transfers with modified independence. Educated pt on availability of DME and AE to make ADLs/IADLs easier and conserve energy during RA flare-ups. SpO2 92% on RA at rest; SpO2 89% after ambulating in hallway - taught pt pursed lip breathing strategy and pt's SpO2 increased to 92%. Pt plans to d/c home with 24/7 assistance from her husband. All education has been completed and pt has no further acute OT needs. OT signing off.    Follow Up Recommendations  No OT follow up;Supervision - Intermittent    Equipment Recommendations  None recommended by OT    Recommendations for Other Services       Precautions / Restrictions Precautions Precautions: Fall Precaution Comments: watch O2 Restrictions Weight Bearing Restrictions: Yes RUE Weight Bearing: Non weight bearing      Mobility Bed Mobility               General bed mobility comments: Pt up in chair on OT arrival  Transfers Overall transfer level: Modified independent Equipment used: None                  Balance Overall balance assessment: Needs assistance Sitting-balance support: Feet supported;No upper extremity supported Sitting balance-Leahy Scale: Normal     Standing balance support: No upper extremity supported;During functional activity Standing balance-Leahy Scale: Good                              ADL Overall ADL's : Modified independent                                       General ADL Comments: Educated pt on  DME and AE availability to make ADLs/IADLs easier and conserve energy during RA flare-ups.     Vision Vision Assessment?: No apparent visual deficits   Perception     Praxis      Pertinent Vitals/Pain Pain Assessment: Faces Faces Pain Scale: Hurts a little bit Pain Location: L shoulder Pain Descriptors / Indicators: Sore Pain Intervention(s): Limited activity within patient's tolerance;Monitored during session;Repositioned     Hand Dominance Right   Extremity/Trunk Assessment Upper Extremity Assessment Upper Extremity Assessment: LUE deficits/detail LUE: Unable to fully assess due to pain LUE Coordination: decreased gross motor   Lower Extremity Assessment Lower Extremity Assessment: Overall WFL for tasks assessed   Cervical / Trunk Assessment Cervical / Trunk Assessment: Normal   Communication Communication Communication: No difficulties   Cognition Arousal/Alertness: Awake/alert Behavior During Therapy: WFL for tasks assessed/performed Overall Cognitive Status: Within Functional Limits for tasks assessed                     General Comments       Exercises       Shoulder Instructions      Home Living Family/patient expects to be discharged to:: Private residence Living Arrangements: Spouse/significant other Available Help at Discharge: Family;Available 24 hours/day Type of Home: House  Home Access: Stairs to enter Entrance Stairs-Number of Steps: 2   Home Layout: One level     Bathroom Shower/Tub: Tub/shower unit Shower/tub characteristics: Engineer, building services: Handicapped height     Home Equipment: None          Prior Functioning/Environment Level of Independence: Independent        Comments: Works, drives        OT Problem List: Decreased strength;Decreased range of motion;Impaired balance (sitting and/or standing);Decreased safety awareness;Decreased knowledge of use of DME or AE;Pain;Impaired UE functional use   OT  Treatment/Interventions:      OT Goals(Current goals can be found in the care plan section) Acute Rehab OT Goals Patient Stated Goal: be able to return home OT Goal Formulation: With patient Time For Goal Achievement: 02/25/16 Potential to Achieve Goals: Good  OT Frequency:     Barriers to D/C:            Co-evaluation              End of Session Equipment Utilized During Treatment: Gait belt Nurse Communication: Mobility status  Activity Tolerance: Patient tolerated treatment well Patient left: in chair;with call bell/phone within reach;with family/visitor present   Time: 0131-4388 OT Time Calculation (min): 11 min Charges:  OT General Charges $OT Visit: 1 Procedure OT Evaluation $OT Eval Low Complexity: 1 Procedure G-Codes:    Nils Pyle, OTR/L Pager: [phone] 02/11/2016, 2:49 PM

## 2016-02-11 NOTE — Progress Notes (Signed)
SUBJECTIVE: The patient is doing well today.  Cardiac cath without obstructive CAD, CTPA negative for PE.  Chest pain has improved on prednisone.  [name] angioplasty book   Does not apply Once  . aspirin  81 mg Oral Daily  . carvedilol  6.25 mg Oral BID WC  . cefTRIAXone (ROCEPHIN)  IV  1 g Intravenous Q24H  . levothyroxine  50 mcg Oral QAC breakfast  . pantoprazole  40 mg Oral Daily  . predniSONE  40 mg Oral Q breakfast  . rosuvastatin  20 mg Oral Daily  . sodium chloride flush  3 mL Intravenous Q12H  . timolol  1 drop Both Eyes BID      OBJECTIVE: Physical Exam: Vitals:   02/10/16 1430 02/10/16 1452 02/10/16 2046 02/11/16 0615  BP:   117/69 130/72  Pulse:   72 63  Resp:   18 18  Temp:   97.4 F (36.3 C) 97.5 F (36.4 C)  TempSrc:   Oral Oral  SpO2: 94% 94% 95% 97%  Weight:      Height:        Intake/Output Summary (Last 24 hours) at 02/11/16 0855 Last data filed at 02/11/16 0843  Gross per 24 hour  Intake             2055 ml  Output             1300 ml  Net              755 ml    Telemetry reveals sinus rhythm, short run of SVT  GEN- The patient is well appearing, alert and oriented x 3 today.   Head- normocephalic, atraumatic Eyes-  Sclera clear, conjunctiva pink Ears- hearing intact Oropharynx- clear Neck- supple, no JVP Lymph- no cervical lymphadenopathy Lungs- Clear to ausculation bilaterally, normal work of breathing Heart- Regular rate and rhythm, no murmurs, rubs or gallops, PMI not laterally displaced GI- soft, NT, ND, + BS Extremities- no clubbing, cyanosis, or edema Skin- no rash or lesion Psych- euthymic mood, full affect Neuro- strength and sensation are intact  LABS: Basic Metabolic Panel:  Recent Labs  94/49/67 0240 02/11/16 0258  NA 135 131*  K 3.7 4.1  CL 102 101  CO2 24 23  GLUCOSE 201* 202*  BUN 16 16  CREATININE 1.07* 0.63  CALCIUM 8.9 8.9   Liver Function Tests:  Recent Labs  02/10/16 0240  AST 66*  ALT 45  ALKPHOS  52  BILITOT 1.7*  PROT 7.2  ALBUMIN 2.9*   No results for input(s): LIPASE, AMYLASE in the last 72 hours. CBC:  Recent Labs  02/10/16 0240 02/11/16 0258  WBC 13.6* 16.8*  NEUTROABS 9.2*  --   HGB 13.4 11.8*  HCT 40.7 35.9*  MCV 96.7 96.5  PLT 205 137*   Cardiac Enzymes:  Recent Labs  02/09/16 1605 02/09/16 2011 02/09/16 2017 02/10/16 0240  CKTOTAL  --   --  29*  --   TROPONINI <0.03 <0.03  --  0.03*   BNP: Invalid input(s): POCBNP D-Dimer:  Recent Labs  02/09/16 1313  DDIMER 2.75*   Hemoglobin A1C: No results for input(s): HGBA1C in the last 72 hours. Fasting Lipid Panel:  Recent Labs  02/10/16 0240  CHOL 92  HDL 33*  LDLCALC 47  TRIG 60  CHOLHDL 2.8   Thyroid Function Tests:  Recent Labs  02/09/16 1605  TSH 2.228   Anemia Panel: No results for input(s): VITAMINB12, FOLATE, FERRITIN, TIBC, IRON, RETICCTPCT  in the last 72 hours.  RADIOLOGY: Ct Angio Chest Pe W Or Wo Contrast  Result Date: 02/09/2016 CLINICAL DATA:  65 year old female with acute chest pain and elevated D-dimer. EXAM: CT ANGIOGRAPHY CHEST WITH CONTRAST TECHNIQUE: Multidetector CT imaging through the chest was performed using the standard protocol during bolus administration of intravenous contrast. Multiplanar reconstructed images and MIPs were obtained and reviewed to evaluate the vascular anatomy. CONTRAST:  100 cc intravenous Isovue 370 COMPARISON:  02/09/2016 chest radiograph and 04/21/2012 chest CT FINDINGS: CTA CHEST FINDINGS Cardiovascular: This is a technically satisfactory study. No pulmonary emboli are identified. Cardiomegaly identified. Ectatic ascending thoracic aorta noted measuring 3.7 cm in greatest diameter. Mediastinum/Nodes: No mediastinal mass, enlarged lymph nodes or pericardial effusion. Lungs/Pleura: Small bilateral pleural effusions and moderate bilateral lower lobe atelectasis identified. No definite airspace disease, nodule, mass or endobronchial/ endotracheal  lesion. There is no evidence of pneumothorax. Upper Abdomen: No acute abnormality. Musculoskeletal: No acute or suspicious abnormality. Review of the MIP images confirms the above findings IMPRESSION: No evidence of pulmonary emboli. Cardiomegaly with ectatic ascending thoracic aorta. Small bilateral pleural effusions and moderate bilateral lower lobe atelectasis. Electronically Signed   By: Harmon Pier M.D.   On: 02/09/2016 18:31   Mr Cervical Spine Wo Contrast  Result Date: 02/10/2016 CLINICAL DATA:  65 y/o  F; chest and back pain starting at 5 a.m. EXAM: MRI CERVICAL SPINE WITHOUT CONTRAST TECHNIQUE: Multiplanar, multisequence MR imaging of the cervical spine was performed. No intravenous contrast was administered. COMPARISON:  07/13/2008 cervical MRI. FINDINGS: Alignment: Straightening of cervical lordosis.  No listhesis. Vertebrae: No fracture, evidence of discitis, or bone lesion. Mild degenerative edema within the C4-5 endplates and left facets. Cord: Normal cord signal. Anterior cord impingement at C4-5 and C5-6. Posterior Fossa, vertebral arteries, paraspinal tissues: Negative. Disc levels: Multilevel disc desiccation and endplate degenerative changes greatest at C4-5 but pronounced from C4 through C7. C2-3: No significant disc displacement, foraminal narrowing, or canal stenosis. C3-4: Small disc osteophyte complex and left-greater-than-right uncovertebral/facet hypertrophy. Mild left-sided foraminal narrowing. Mild canal stenosis. C4-5: Moderate disc osteophyte complex with ligamentum flavum hypertrophy in extensive bilateral uncovertebral and facet hypertrophy. Moderate to severe bilateral foraminal narrowing. Severe canal stenosis with cord impingement and flattening. C5-6: Moderate disc osteophyte complex eccentric to the left subarticular and foraminal zones and extensive uncovertebral and facet hypertrophy. Central annular fissure. Moderate to severe right and severe left foraminal narrowing.  Moderate canal stenosis with anterior cord impingement greater on the left and anterior cord flattening. C6-7: Moderate disc osteophyte complex and bilateral uncovertebral/ facet hypertrophy. Mild right and moderate left foraminal narrowing. Moderate canal stenosis. C7-T1: Small disc osteophyte complex and left-greater-than-right uncovertebral/ facet hypertrophy. Mild right and moderate left foraminal narrowing. Mild canal stenosis. IMPRESSION: Extensive cervical spondylosis greatest at the C4-5 with there is severe canal stenosis and cord impingement. Additionally there is moderate to severe canal stenosis at the C5-6 level Octavius Shin left-greater-than-right anterior cord impingement. Foraminal narrowing is moderate to severe from C4-C6 as well. Degenerative changes are mildly progressed from 2010. Electronically Signed   By: Mitzi Hansen M.D.   On: 02/10/2016 02:44   Dg Chest Portable 1 View  Result Date: 02/09/2016 CLINICAL DATA:  Chest pain EXAM: PORTABLE CHEST 1 VIEW COMPARISON:  03/24/2013 chest radiograph. FINDINGS: Low lung volumes. Stable cardiomediastinal silhouette with normal heart size. No pneumothorax. No pleural effusion. No pulmonary edema. Mild bibasilar hazy opacities. Cholecystectomy clips are seen in the right upper quadrant of the abdomen. IMPRESSION: Low lung volumes  with mild hazy bibasilar lung opacities, favor atelectasis. Electronically Signed   By: Delbert Phenix M.D.   On: 02/09/2016 11:51    ASSESSMENT AND PLAN:  Active Problems:   Chest pain, mid sternal   Essential hypertension   Abnormal ECG   Chest pain   Rheumatoid arthritis (HCC)   Dysphagia   Neck pain  1.  Chest pain - unlikely cardiac in nature as had normal cath.  Likely musculoskeletal as having pain when taking a deep breath.  Improved pain with prednisone.  2.  Chronic [name]  3.  HTN - BP controlled on current meds.  Continue BB.  4.  Hyperlipidemia - continue statin and check FLP and ALT in  am.  5.  RA on Remicaide/naproxen.  6.  Erythrocytosis ? etiology  Cardiology to sign off today and transfer care to IM.  Please call back if we can be of assistance.  Brance Dartt Jorja Loa, MD 02/11/2016 8:55 AM

## 2016-02-11 NOTE — Progress Notes (Signed)
SATURATION QUALIFICATIONS: (This note is used to comply with regulatory documentation for home oxygen)  Patient Saturations on Room Air at Rest = 94%  Patient Saturations on Room Air while Ambulating = 92%  

## 2016-02-11 NOTE — Discharge Instructions (Signed)
Follow with Primary MD Eartha Inch, MD in 7 days   Get CBC, CMP, 2 view Chest X ray checked  by Primary MD next visit.    Activity: As tolerated with Full fall precautions use walker/cane & assistance as needed   Disposition Home    Diet: Heart Healthy , with feeding assistance and aspiration precautions.  For Heart failure patients - Check your Weight same time everyday, if you gain over 2 pounds, or you develop in leg swelling, experience more shortness of breath or chest pain, call your Primary MD immediately. Follow Cardiac Low Salt Diet and 1.5 lit/day fluid restriction.   On your next visit with your primary care physician please Get Medicines reviewed and adjusted.   Please request your Prim.MD to go over all Hospital Tests and Procedure/Radiological results at the follow up, please get all Hospital records sent to your Prim MD by signing hospital release before you go home.   If you experience worsening of your admission symptoms, develop shortness of breath, life threatening emergency, suicidal or homicidal thoughts you must seek medical attention immediately by calling 911 or calling your MD immediately  if symptoms less severe.  You Must read complete instructions/literature along with all the possible adverse reactions/side effects for all the Medicines you take and that have been prescribed to you. Take any new Medicines after you have completely understood and accpet all the possible adverse reactions/side effects.   Do not drive, operating heavy machinery, perform activities at heights, swimming or participation in water activities or provide baby sitting services if your were admitted for syncope or siezures until you have seen by Primary MD or a Neurologist and advised to do so again.  Do not drive when taking Pain medications.    Do not take more than prescribed Pain, Sleep and Anxiety Medications  Special Instructions: If you have smoked or chewed Tobacco  in  the last 2 yrs please stop smoking, stop any regular Alcohol  and or any Recreational drug use.  Wear Seat belts while driving.   Please note  You were cared for by a hospitalist during your hospital stay. If you have any questions about your discharge medications or the care you received while you were in the hospital after you are discharged, you can call the unit and asked to speak with the hospitalist on call if the hospitalist that took care of you is not available. Once you are discharged, your primary care physician will handle any further medical issues. Please note that NO REFILLS for any discharge medications will be authorized once you are discharged, as it is imperative that you return to your primary care physician (or establish a relationship with a primary care physician if you do not have one) for your aftercare needs so that they can reassess your need for medications and monitor your lab values.

## 2016-02-12 LAB — ANTINUCLEAR ANTIBODIES, IFA: ANTINUCLEAR ANTIBODIES, IFA: NEGATIVE

## 2016-02-13 NOTE — Telephone Encounter (Signed)
Called and spoke with patient to see how she is feeling.  She states she is grateful that the cardiac testing all checked out normal.  She states she is following up with her pulmonary doctor and others as directed.  We discussed her already scheduled appointment with Dr. Elease Hashimoto on 10/24.  She states she will call back if she feels that she needs to be seen sooner.  She thanked me for the call.

## 2016-02-15 ENCOUNTER — Ambulatory Visit (INDEPENDENT_AMBULATORY_CARE_PROVIDER_SITE_OTHER): Payer: Medicare Other | Admitting: Internal Medicine

## 2016-02-15 ENCOUNTER — Ambulatory Visit (INDEPENDENT_AMBULATORY_CARE_PROVIDER_SITE_OTHER)
Admission: RE | Admit: 2016-02-15 | Discharge: 2016-02-15 | Disposition: A | Discharge status: Home / Self Care | Payer: Medicare Other | Source: Ambulatory Visit | Attending: Internal Medicine | Admitting: Internal Medicine

## 2016-02-15 ENCOUNTER — Encounter: Payer: Self-pay | Admitting: Internal Medicine

## 2016-02-15 VITALS — BP 138/80 | HR 89 | Ht 70.0 in | Wt 210.0 lb

## 2016-02-15 DIAGNOSIS — R0600 Dyspnea, unspecified: Secondary | ICD-10-CM

## 2016-02-15 DIAGNOSIS — R072 Precordial pain: Secondary | ICD-10-CM

## 2016-02-15 DIAGNOSIS — R0789 Other chest pain: Secondary | ICD-10-CM

## 2016-02-15 LAB — CULTURE, BLOOD (SINGLE): CULTURE: NO GROWTH

## 2016-02-15 NOTE — Progress Notes (Signed)
Spoke with pt and notified of results per Dr. Wert. Pt verbalized understanding and denied any questions. 

## 2016-02-15 NOTE — Progress Notes (Signed)
Subjective:    Patient ID: Sydney Wolfe, female    DOB: 19-Jun-1950,     MRN: [id number]  HPI   45 yowf never smoker some rhinitis spring and fall completed allergy shots around 2013/4 and no need for any meds since or ever dx with asthma but bothered by chronic back problems T1/T2 with L shoulder discomfort new pattern of pain around T 6- 8 distribution assoc with sob  and bad arthritis toes knees wrists  starting Nov 2016 dx as sero neg RA/ djd  by Dr Trudie Reed but ? Poor  response to Remicade  So back to meloxicam but pain got worse and eventually admitted to [facility]:  Admission date:  02/09/2016    Discharge Date:  02/11/2016   Admission Diagnosis  Nonspecific chest pain [R07.9]   Discharge Diagnosis  Nonspecific chest pain [R07.9]    Active Problems:   Chest pain, mid sternal   Essential hypertension   Abnormal ECG   Chest pain   Rheumatoid arthritis ([place])   Dysphagia   Neck pain          Past Medical History:  Diagnosis Date  . Arthritis   . Gout   . Heart murmur   . 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 Hot flashes   . Hyperlipidemia   . Hypertension   . Hypothyroidism   . Normal nuclear stress test 2004  . OA (osteoarthritis)   . Obesity   . [name] (right bundle branch block)          Past Surgical History:  Procedure Laterality Date  . CARDIAC CATHETERIZATION N/A 02/09/2016   Procedure: Left Heart Cath and Coronary Angiography;  Surgeon: Jettie Booze, MD;  Location: [facility];  Service: Cardiovascular;  Laterality: N/A;  . CARDIOVASCULAR STRESS TEST  08/20/2002   EF 76%  . CARPAL TUNNEL RELEASE    . CHOLECYSTECTOMY  1987  . DILATION AND CURETTAGE OF UTERUS  multiple  . HAND SURGERY  1987 and 1988   bilateral  . SPINE SURGERY  08/2008   T2  . TUBAL LIGATION  1975  . US ECHOCARDIOGRAPHY  07/25/2004   EF 55-60%       History of present illness and  Hospital Course:       Kindly see H&P for history of present illness and admission details, please review complete Labs, Consult reports and Test reports for all details in brief  HPI  from the history and physical done on the day of admission 02/09/2016  HPI: This is [name] with a history of RA, Hyperlipidemia, HTN who is followed by Dr. Acie Fredrickson for chronic [name] and prior history of CP with normal stress myoview and normal cardiac MRI in 2014. She recently saw him for complaints of DOE and chest pain when climbing stairs and other exertional activities. She described it as a weight on her chest. She underwent nuclear stress test that was low risk with fixed apical defect. Since then she has continued to have DOE. Her and her husband were out yesterday and she developed pain across the back of her neck. This was intermittent in severity throughout the day but never went completely away and radiated into her chest. She was able to sleep last night but this am when she awakened she had sudden onset of severe squeezing pain in her left chest with radiation into her jaw and left arm heaviness. She  came to [facility] ER. In ER she is very uncomfortable complaining of severe CP somewhat worse with palpation of her chest wall and somewhat worse with inspiration. Her initial O2 sats on RA were 97%. She denies any recent long distance travel (last trip to [place] 11/28/2015). She has a strong family history of CAD with her dad having an MI and her brother died of an MI. Cardiology is now asked to evaluate.   Hospital Course  65 y.o.femalewith medical history significant of HTN, rheumatoid arthritis, [name], Admitted with complaints of chest pain, seen by cardiology, status post cardiac cath which did show normal coronaries, has elevated D dimers, CTA chest negative for PE, [facility] hospitalist consulted for noncardiac chest pain.   Chest pain - appears to be musculoskeletal, cardiac cath with no Significant coronary  artery disease, CTA chest negative for PE. - Unclear to his chest pain is related to RA flare, but this is not typical for her previous flares  Rheumatoid arthritis with possible flare - Patient with elevated CRP and ESR, no baseline to compare to, reports improvement on one dose of IV steroids, so will start on prednisone taper .  Dysphagia - Patient reports it is improving, recommendation for outpatient evaluation if persists.  Fever/UTI - Possibly related to atelectasis versus UTI, treated with Rocephin during hospital stay, is another days of oral ciprofloxacin as an outpatient, encouraged to continue using incentive spirometry.  Acute hypoxic respiratory failure - Secondary to atelectasis and diastolic CHF - Patient reports progressive dyspnea over last few weeks, CT chest significant for atelectasis and small pleural effusion, improved with incentive spirometry , is to continue to use as an outpatient, as well evidence of small pleural effusion on CT chest, so started on low dose Lasix on discharge especially with evidence of grade 1 diastolic CHF on 2-D echo . - Resolved, oxygen saturation 91% on ambulation - Has a follow-up appointment with Dr. Wert this Thursday  Cervical radiculopathy - MRI significant for radiculopathy, discussed with Dr. Ditty, this can be followed as an outpatient     02/15/2016 1st [facility] office visit/ Wert   Chief Complaint  Patient presents with  . Pulmonary Consult    Referred by Dr. Angela Hawkes. Pt c/o SOB and CP since Nov 2016. She states her chest hurts when she takes a deep breath. She states "I can't walk anywhere without getting SOB some days".   started prednisone in hospital but no better  Doe x across the room  Sleeps right side down Leg swelling was an issue x years but better on lasix s improvement in sob  Dysphagia variably better Cp migratory as is arthritis  - cp lasts minutes to hours s pattern but may be better  supine    No obvious day to day or daytime variability or assoc chronic cough or classically pleuritic or ex cp or chest tightness, subjective wheeze or overt sinus or hb symptoms. No unusual exp hx or h/o childhood pna/ asthma or knowledge of premature birth.  Sleeping ok on R side horizontally without nocturnal  or early am exacerbation  of respiratory  c/o's or need for noct saba. Also denies any obvious fluctuation of symptoms with weather or environmental changes or other aggravating or alleviating factors except as outlined above   Current Medications, Allergies, Complete Past Medical History, Past Surgical History, Family History, and Social History were reviewed in [facility] electronic medical record.      Review of Systems  Constitutional: Positive   for appetite change. Negative for chills, fever and unexpected weight change.  HENT: Positive for dental problem and trouble swallowing. Negative for congestion, ear pain, nosebleeds, postnasal drip, rhinorrhea, sinus pressure, sneezing, sore throat and voice change.   Eyes: Negative for visual disturbance.  Respiratory: Positive for shortness of breath. Negative for cough and choking.   Cardiovascular: Positive for chest pain and leg swelling.  Gastrointestinal: Negative for abdominal pain, diarrhea and vomiting.  Genitourinary: Negative for difficulty urinating.  Musculoskeletal: Positive for arthralgias.  Skin: Negative for rash.  Neurological: Negative for tremors, syncope and headaches.  Hematological: Does not bruise/bleed easily.       Objective:   Physical Exam   amb obese wf nad    Wt Readings from Last 3 Encounters:  02/15/16 210 lb (95.3 kg)  02/09/16 200 lb (90.7 kg)  12/20/15 217 lb (98.4 kg)    Vital signs reviewed    HEENT: nl dentition, turbinates, and oropharynx. Nl external ear canals without cough reflex   NECK :  without JVD/Nodes/TM/ nl carotid upstrokes bilaterally   LUNGS: no acc muscle  use,  Nl contour chest which is clear to A and P bilaterally without cough on insp or exp maneuvers   CV:  RRR  no s3 or murmur or increase in P2, no edema   ABD:  soft and nontender with nl inspiratory excursion in the supine position. No bruits or organomegaly, bowel sounds nl  MS:  Nl gait/ ext warm without deformities, calf tenderness, cyanosis or clubbing No obvious joint restrictions   SKIN: warm and dry without lesions    NEURO:  alert, approp, nl sensorium with  no motor deficits       I personally reviewed images and agree with radiology impression as follows:  CTa Chest    915/17 No evidence of pulmonary emboli. Cardiomegaly with ectatic ascending thoracic aorta. Small bilateral pleural effusions and moderate bilateral lower lobe atelectasis.    CXR PA and Lateral:   02/15/2016 :    I personally reviewed images and agree with radiology impression as follows:   Cardiac shadow remains mildly enlarged. The lungs are well aerated bilaterally. Minimal residual atelectatic changes are noted in the bases although no sizable effusion or focal infiltrate is seen. No bony abnormality is noted.    Lab Results  Component Value Date   ESRSEDRATE 50 (H) 02/09/2016        Assessment & Plan:

## 2016-02-15 NOTE — Patient Instructions (Addendum)
Please remember to go to the l  x-ray department downstairs for your tests - we will call you with the results when they are available.  Try prilosec otc 20mg   Take 30-60 min before first meal of the day and Pepcid ac (famotidine) 20 mg one @  bedtime until return  Continue Incentive spirometry as much as you can  Continue the gabapentin - may need to adjust up   Treatment consists of avoiding foods that cause gas (especially boiled eggs/ food/ beans and raw vegetables like spinach and salads)  and citrucel 1 heaping tsp twice daily with a large glass of water.  Pain should improve w/in 2 weeks and if not then consider further GI work up.       GERD (REFLUX)  is an extremely common cause of respiratory symptoms just like yours , many times with no obvious heartburn at all.    It can be treated with medication, but also with lifestyle changes including elevation of the head of your bed (ideally with 6 inch  bed blocks),  Smoking cessation, avoidance of late meals, excessive alcohol, and avoid fatty foods, chocolate, peppermint, colas, red wine, and acidic juices such as orange juice.  NO MINT OR MENTHOL PRODUCTS SO NO COUGH DROPS   USE SUGARLESS CANDY INSTEAD (Jolley ranchers or Stover's or Life Savers) or even ice chips will also do - the key is to swallow to prevent all throat clearing. NO OIL BASED VITAMINS - use powdered substitutes.    Please schedule a follow up office visit in 4 weeks, sooner if needed

## 2016-02-15 NOTE — Assessment & Plan Note (Addendum)
Neg cardiac w/u so either mscp from RA or IBS or both (suggested  By migratory nature) but could also be neuralgia and agree with continuing neurontin and in fact pushing up to 300 tid if not improving   rec emprical rx for IBS/ gerd / diet x 4 weeks then regroup

## 2016-02-15 NOTE — Assessment & Plan Note (Signed)
02/09/16 LHC neg CTa  915/17  Small effusions/ atx  - 02/15/2016  Walked RA x 3 laps @ 185 ft each stopped due to  End of study nl pace/ sob but no desat   Symptoms are markedly disproportionate to objective findings and not clear this is a lung problem but pt does appear to have difficult airway management issues. DDX of  difficult airways management almost all start with A and  include Adherence, Ace Inhibitors, Acid Reflux, Active Sinus Disease, Alpha 1 Antitripsin deficiency, Anxiety masquerading as Airways dz,  ABPA,  Allergy(esp in young), Aspiration (esp in elderly), Adverse effects of meds,  Active smokers, A bunch of PE's (a small clot burden can't cause this syndrome unless there is already severe underlying pulm or vascular dz with poor reserve) plus two Bs  = Bronchiectasis and Beta blocker use..and one C= CHF   Adherence is always the initial "prime suspect" and is a multilayered concern that requires a "trust but verify" approach in every patient - starting with knowing how to use medications, especially inhalers, correctly, keeping up with refills and understanding the fundamental difference between maintenance and prns vs those medications only taken for a very short course and then stopped and not refilled.   ? Acid (or non-acid) GERD > always difficult to exclude as up to 75% of pts in some series report no assoc GI/ Heartburn symptoms> rec max (24h)  acid suppression and diet restrictions/ reviewed and instructions given in writing.   ? Arthritis/ migratory assoc with migratory cp and ESR 50 some better on prednisone so ? Will she worsen off it  ? A bunch of PE's > neg CTa reassuring   ? Anxiety > usually at the bottom of this list of usual suspects    ? Chf/cardiac > ruled out by [facility]     Total time devoted to counseling  = 35/20mreview case with pt/husband  discussion of options/alternatives/ personally creating written instructions  in presence of pt  then going over those  specific  Instructions directly with the pt including how to use all of the meds but in particular covering each new medication in detail and the difference between the maintenance/automatic meds and the prns using an action plan format for the latter.

## 2016-02-19 ENCOUNTER — Telehealth: Payer: Self-pay | Admitting: Cardiovascular Disease

## 2016-02-19 NOTE — Telephone Encounter (Signed)
New message     Dtr of patient states that pt is in hosp and is running into some road blocks about pts heathcare. She would like the doc or nurse to call and assist in this matter. You can call either the dtr or the husband. Husband [phone]. Please call.

## 2016-02-19 NOTE — Telephone Encounter (Signed)
Do not see DPR on file for [facility] for husband, LMTCB.

## 2016-02-19 NOTE — Telephone Encounter (Signed)
Pt's husband, Onalee Hua states pt was admitted to [facility], with new atrial fibrillation on Friday.  Pt's husband states pt has also been diagnosed with pericarditis. Pt is currently an inpatient at [facility].   Pt's husband is requesting Dr Elease Hashimoto review CT scan and other records from [facility] and follow up with him. Pt's husband advised I will forward to Dr Elease Hashimoto for review.

## 2016-02-19 NOTE — Telephone Encounter (Signed)
I did find DPR on file for all [facility] practices for husband, Onalee Hua.

## 2016-02-19 NOTE — Telephone Encounter (Signed)
I am not able to see any of those results at this time. They probably will not be available during the hospitalization

## 2016-02-19 NOTE — Telephone Encounter (Signed)
Pt's husband aware Dr Elease Hashimoto unable to see results at this time, he thanked me for my help.

## 2016-02-20 ENCOUNTER — Telehealth: Payer: Self-pay | Admitting: Cardiovascular Disease

## 2016-02-20 NOTE — Telephone Encounter (Signed)
Phone call with Vilinda Boehringer [facility])  Pt was admitted to [facility] with A-fib and acute pericarditis She is better on NSAID and Colchicine Has converted on Flecainide.  In discussing with Dr. Leeann Must, I agree that the A-fib is likely due to her pericarditis and that I would avoid anticoagulation at this time.  Would not want to risk hemorrhagic pericardial effusion . She is feeling better.  She has a hx of rheumatiod arthritis. She should see her rheumatologist to see if she needs increased therapy for her RA.   This pericarditis may be related to her RA   I will see her in follow up  We will likely need to consider periodic monitoring ( event monitors vs. Alivecor Kardia monitors ) in the future.     [name], MD  02/20/2016 10:44 AM    [facility] [address] [place],  [address] [place], [place]  [address] Pager [phone] Phone: [phone]; Fax: [phone]

## 2016-02-22 NOTE — Progress Notes (Signed)
Cardiology Office Note    Date:  02/23/2016   ID:  Sydney Wolfe, DOB 23-Sep-1950, MRN [id number]  PCP:  Chesley Noon, MD  Cardiologist:  Dr. Acie Fredrickson  Chief Complaint: Hospital follow up for afib and acute pericarditis  History of Present Illness:   Sydney Wolfe is a 65 y.o. female  with a history of RA, Hyperlipidemia, HTN, chronic [name] and recent two different admission this month who presented for follow up.   She had prior history of CP with normal stress myoview and normal cardiac MRI in 2014. Recently 12/20/15 underwent nuclear stress test that was low risk with fixed apical defect. Echo 12/28/15 showed normal LV function, grade 1dd and mild dilated ascending aorta.   Presented to [facility] ER 02/09/16 for symptoms concerning for unstable angina. Plan made for cardiac cath as she has been having exertional CP and DOE for several months which showed normal coronary arteries with normal LVF and normal LVEDP.  D-Dimer elevated at 2.75.  CT chest was negative for PE. She was evaluated by IM team for non cardiac chest pain. Felt mostly MSK however it was unclear that it was due to RA flare. Elevated CRP and ESR--> that improved on IV steroids--> stated on prednisone taper. Also treated with Rocephin for UTI. She was discharged on 02/11/16 with close f/u with PCP.   Seen by Dr. Mertha Finders 02/15/16 who felt s/s is form RA or IBS or both. Up-titrated neurontin to 322m TID and started on Prilosec for possible GERD.   She presented in atrial fibrillation on Friday 02/16/16 but converted to normal sinus rhythm and was [facility]  She was readmitted again Saturday night with rapid atrial fibrillation. She converted to NSR while she was still in ED. [facility] and started on cardizem gtt. Echo was performed which revealed normal LV function and there was a small pericardial effusion. There was only 1+ aortic  insufficiency and mild mitral and tricuspid insufficiency. The only change from her study in August was the small pericardial effusion. Seen by cardiology  and felt that she had acute pericarditis likely secondary to RA.  Symptoms improved on NSAID and Colchicine. Discontinued gtt and started on Po cardizem and flecainide. Maintained sinus rhythm > 24 hours. [facility] note [facility] [place]   Discussed with Dr. NAcie Fredrickson(primary cardiologist) ---> the A-fib is likely due to her pericarditis and plan to avoid anticoagulation at this time.  Would not want to risk hemorrhagic pericardial effusion. Advised to f/u with rheumatologist to see if she needs increased therapy for her RA.   This pericarditis may be related to her RA. Continue colchicine for 3 months. D/C gabapentin, ASA and Mobic.   Here today for follow up. Chest pain has been resolved. Continues feel weak and tried. Intermittent dyspnea. She has gained 8lb recently. No orthopnea, PND or syncope. No palpitations.    Past Medical History:  Diagnosis Date  . Arthritis   . Gout   . Heart murmur   . 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 Hot flashes   . Hyperlipidemia   . Hypertension   . Hypothyroidism   . Normal nuclear stress test 2004  . OA (osteoarthritis)   . Obesity   . [name] (right bundle branch block)     Past Surgical History:  Procedure Laterality Date  . CARDIAC CATHETERIZATION N/A 02/09/2016   Procedure: Left Heart Cath and Coronary Angiography;  Surgeon: Jettie Booze, MD;  Location: [facility];  Service: Cardiovascular;  Laterality: N/A;  . CARDIOVASCULAR STRESS TEST  08/20/2002   EF 76%  . CARPAL TUNNEL RELEASE    . CHOLECYSTECTOMY  1987  . DILATION AND CURETTAGE OF UTERUS  multiple  . HAND SURGERY  1987 and 1988   bilateral  . SPINE SURGERY  08/2008   T2  . TUBAL LIGATION  1975  . US ECHOCARDIOGRAPHY  07/25/2004   EF 55-60%    Current  Medications:  Prior to Admission medications   Medication Sig Start Date End Date Taking? Authorizing Provider  BETIMOL 0.5 % ophthalmic solution Apply 1 drop to eye 2 (two) times daily.  11/02/14  Yes Historical Provider, MD  CARTIA XT 180 MG 24 hr capsule Take 180 mg by mouth daily.  02/21/16  Yes Historical Provider, MD  colchicine 0.6 MG tablet Take 0.6 mg by mouth 2 (two) times daily.  02/21/16  Yes Historical Provider, MD  flecainide (TAMBOCOR) 100 MG tablet Take 100 mg by mouth 2 (two) times daily.  02/21/16  Yes Historical Provider, MD  ibuprofen (ADVIL,MOTRIN) 800 MG tablet Take 800 mg by mouth every 6 (six) hours as needed for moderate pain.  02/21/16  Yes Historical Provider, MD  levothyroxine (SYNTHROID, LEVOTHROID) 50 MCG tablet Take 1 tablet by mouth daily. 10/24/15  Yes Historical Provider, MD  nitroGLYCERIN (NITROSTAT) 0.4 MG SL tablet Place 1 tablet (0.4 mg total) under the tongue every 5 (five) minutes as needed for chest pain. 03/26/13  Yes Rhonda G Barrett, PA-C  ondansetron (ZOFRAN) 4 MG tablet Take 4 mg by mouth every 8 (eight) hours as needed for nausea or vomiting.  02/21/16  Yes Historical Provider, MD  pantoprazole (PROTONIX) 40 MG tablet Take 40 mg by mouth daily.  02/21/16  Yes Historical Provider, MD  rosuvastatin (CRESTOR) 5 MG tablet Take 5 mg by mouth every other day.   Yes Historical Provider, MD    Allergies:   Review of patient's allergies indicates no known allergies.   Social History   Social History  . Marital status: Married    Spouse name: N/A  . Number of children: N/A  . Years of education: N/A   Social History Main Topics  . Smoking status: Never Smoker  . Smokeless tobacco: Never Used  . Alcohol use No  . Drug use: No  . Sexual activity: Yes   Other Topics Concern  . None   Social History Narrative  . None     Family History:  The patient's family history includes Diabetes in her father; Heart failure in her mother; Hypertension in her  father; Rheum arthritis in her father and mother; Stroke in her father.   ROS:   Please see the history of present illness.    ROS All other systems reviewed and are negative.   PHYSICAL EXAM:   VS:  BP (!) 142/82   Pulse 79   Ht 5' 10" (1.778 m)   Wt 213 lb 12.8 oz (97 kg)   SpO2 97%   BMI 30.68 kg/m    GEN: Well nourished, well developed, in no acute distress  HEENT: normal  Neck: no JVD, carotid bruits, or masses Cardiac: RRR; no murmurs, rubs, or gallops,no edema  Respiratory:  clear to auscultation bilaterally, normal work of breathing GI: soft, nontender, nondistended, + BS MS:  no deformity or atrophy  Skin: warm and dry, no rash Neuro:  Alert and Oriented x 3, Strength and sensation are intact Psych: euthymic mood, full affect  Wt Readings from Last 3 Encounters:  02/23/16 213 lb 12.8 oz (97 kg)  02/15/16 210 lb (95.3 kg)  02/09/16 200 lb (90.7 kg)      Studies/Labs Reviewed:   EKG:  EKG is ordered today.  The ekg ordered today demonstrates sinus rhythm at rate of 80  Bpm, [name].   Recent Labs: 02/09/2016: TSH 2.228 02/10/2016: ALT 45 02/11/2016: BUN 16; Creatinine, Ser 0.63; Hemoglobin 11.8; Platelets 137; Potassium 4.1; Sodium 131   Lipid Panel    Component Value Date/Time   CHOL 92 02/10/2016 0240   CHOL 131 10/04/2013 0824   TRIG 60 02/10/2016 0240   HDL 33 (L) 02/10/2016 0240   HDL 64 10/04/2013 0824   CHOLHDL 2.8 02/10/2016 0240   VLDL 12 02/10/2016 0240   LDLCALC 47 02/10/2016 0240   LDLCALC 47 10/04/2013 0824    Additional studies/ records that were reviewed today include:   Echocardiogram: 12/28/15 Study Conclusions  - Left ventricle: The cavity size was normal. Wall thickness was   increased in a pattern of mild LVH. Systolic function was normal.   The estimated ejection fraction was in the range of 60% to 65%.   Wall motion was normal; there were no regional wall motion   abnormalities. Doppler parameters are consistent with abnormal    left ventricular relaxation (grade 1 diastolic dysfunction). - Aortic valve: There was trivial regurgitation. - Aorta: Ascending aortic diameter: 40 mm (S). - Ascending aorta: The ascending aorta was mildly dilated. - Mitral valve: There was mild regurgitation. - Right ventricle: The cavity size was normal. Systolic function   was normal. - Tricuspid valve: Peak RV-RA gradient (S): 24 mm Hg. - Pulmonary arteries: PA peak pressure: 27 mm Hg (S). - Inferior vena cava: The vessel was normal in size. The   respirophasic diameter changes were in the normal range (>= 50%),   consistent with normal central venous pressure.  Impressions:  - Normal LV size with mild LV hypertrophy. EF 60-65%. Normal RV   size and systolic function. Mild mitral regurgitation.   Cardiac Catheterization: 02/09/16 Left Heart Cath and Coronary Angiography  Conclusion     The left ventricular systolic function is normal.  The left ventricular ejection fraction is 55-65% by visual estimate.  There is no aortic valve stenosis.  LV end diastolic pressure is normal.  No angiographically apparent coronary artery disease.  No evidence of thoracic aortic dissection.   Likely noncardiac chest pain.  Heparin stopped post procedure.  This will need to be restarted if necessary, per Dr. Radford Pax.    Echo 02/18/16  Interpretation Summary A complete portable two-dimensional transthoracic echocardiogram with color flow Doppler and Spectral Doppler was performed. The study was technically adequate. There is no comparison study available. The left ventricle is normal in size, wall thickness and wall motion with ejection fraction of 60-65%. There is a small pericardial effusion without echocardiographic evidence of tamponade. There is mild (1+) mitral regurgitation. There is mild (1+) tricuspid regurgitation. The aortic valve is trileaflet. Grade I mild diastolic dysfunction; abnormal relaxation pattern. There  is moderate [2+] aortic regurgitation present.  Left Ventricle The left ventricle is normal in size, wall thickness and wall motion with ejection fraction of 60-65%. Grade I mild diastolic dysfunction; abnormal relaxation pattern.   Right Ventricle The right ventricle is not well  visualized. The right ventricle is grossly normal in size and function.  Atria The left atrium is not well visualized. The left atrium is normal size. The right atrium is not well visualized. The IVC is normal in size.  Mitral Valve The mitral valve is mildly thickened. There is mild (1+) mitral regurgitation.   Tricuspid Valve The tricuspid valve is grossly normal. There is mild (1+) tricuspid regurgitation.  Aortic Valve The aortic valve is trileaflet. The aortic valve opens well. There is moderate [2+] aortic regurgitation present.  Pulmonic Valve The pulmonic valve is not well visualized. There is trace pulmonic regurgitation.  Vessels The aortic root is normal in diameter.  Pericardium There is a small pericardial effusion without echocardiographic evidence of tamponade.   ASSESSMENT & PLAN:    1. Acute Pericarditis - CP has been resolved. However continued to feel weak and tied with intermitted dyspnea. Suspected RA flare for which she hasn't f/u with Rhematologist yet. Advised to call today and make appointment sooner rather than later.  Continue Ibuprofen and colchine. Call us if worsening of symptoms.    2. PAF - Maintaining sinus rhythm. Continue Diltiazem and flecainide. Given recent acute pericarditis, she was not started on anticoagulation. Will send staff message to Dr. Cathie Olden regarding stress test due to flecinide use (recent normal cath 02/09/16 and has [name]) and anticoagulation initiation timing as well as ? Need of event monitors vs. Alivecor Kardia monitors in the future. EF of 60-65% on echo 12/28/15 & 02/18/16. Denies palpitations. CHADSVASCs score of 4 (age, sex, HTN and  DD)  3. HTN - stable and well controlled. Continue current medications.   4. HLD - 02/10/2016: Cholesterol 92; HDL 33; LDL Cholesterol 47; Triglycerides 60; VLDL 12  - Continue statin  5. RA - F/u with Rheumatologist Gavin Pound) given recent episode/admission due to possible RA flare. She will call today to make appointment.   6. Chronic diastolic  CHF - She has gained 8 lb recently. She is euvolemic on exam. Echo showed grad 1 DD.   Medication Adjustments/Labs and Tests Ordered: Current medicines are reviewed at length with the patient today.  Concerns regarding medicines are outlined above.  Medication changes, Labs and Tests ordered today are listed in the Patient Instructions below. Patient Instructions  Medication Instructions:  Your physician recommends that you continue on your current medications as directed. Please refer to the Current Medication list given to you today.   Labwork: None ordered  Testing/Procedures: None ordered  Follow-Up: Your physician recommends that you schedule a follow-up appointment in: [place] F/U APPOINTMENT WITH DR. Acie Fredrickson 03/19/16   Any Other Special Instructions Will Be Listed Below (If Applicable).   If you need a refill on your cardiac medications before your next appointment, please call your pharmacy.      Jarrett Soho, [place]  02/23/2016 11:00 AM    [facility] [place], [place], [place]  [address] Phone: [phone]; Fax: [phone]

## 2016-02-23 ENCOUNTER — Ambulatory Visit (INDEPENDENT_AMBULATORY_CARE_PROVIDER_SITE_OTHER): Payer: Medicare Other | Admitting: Physician Assistant

## 2016-02-23 ENCOUNTER — Encounter: Payer: Self-pay | Admitting: Physician Assistant

## 2016-02-23 VITALS — BP 142/82 | HR 79 | Ht 70.0 in | Wt 213.8 lb

## 2016-02-23 DIAGNOSIS — I4891 Unspecified atrial fibrillation: Secondary | ICD-10-CM | POA: Diagnosis not present

## 2016-02-23 DIAGNOSIS — I309 Acute pericarditis, unspecified: Secondary | ICD-10-CM | POA: Diagnosis not present

## 2016-02-23 DIAGNOSIS — I10 Essential (primary) hypertension: Secondary | ICD-10-CM

## 2016-02-23 DIAGNOSIS — I5032 Chronic diastolic (congestive) heart failure: Secondary | ICD-10-CM

## 2016-02-23 DIAGNOSIS — R0602 Shortness of breath: Secondary | ICD-10-CM

## 2016-02-23 DIAGNOSIS — I480 Paroxysmal atrial fibrillation: Secondary | ICD-10-CM

## 2016-02-23 MED ORDER — COLCHICINE 0.6 MG PO TABS: 0.6000 mg | ORAL_TABLET | Freq: Two times a day (BID) | ORAL | 3 refills | Status: DC

## 2016-02-23 MED ORDER — FLECAINIDE ACETATE 100 MG PO TABS: 100.0000 mg | ORAL_TABLET | Freq: Two times a day (BID) | ORAL | 1 refills | Status: DC

## 2016-02-23 NOTE — Patient Instructions (Addendum)
Medication Instructions:  Your physician recommends that you continue on your current medications as directed. Please refer to the Current Medication list given to you today.   Labwork: None ordered  Testing/Procedures: None ordered  Follow-Up: Your physician recommends that you schedule a follow-up appointment in: KEEP YOUR F/U APPOINTMENT WITH DR. Elease Hashimoto 03/19/16   Any Other Special Instructions Will Be Listed Below (If Applicable).   If you need a refill on your cardiac medications before your next appointment, please call your pharmacy.

## 2016-03-06 ENCOUNTER — Encounter: Payer: Self-pay | Admitting: Cardiovascular Disease

## 2016-03-18 ENCOUNTER — Encounter: Payer: Self-pay | Admitting: Internal Medicine

## 2016-03-18 ENCOUNTER — Ambulatory Visit (INDEPENDENT_AMBULATORY_CARE_PROVIDER_SITE_OTHER): Payer: Medicare Other | Admitting: Internal Medicine

## 2016-03-18 VITALS — BP 140/88 | HR 85 | Ht 70.0 in | Wt 206.0 lb

## 2016-03-18 DIAGNOSIS — R0602 Shortness of breath: Secondary | ICD-10-CM

## 2016-03-18 NOTE — Assessment & Plan Note (Signed)
02/09/16 LHC neg CTa  915/17  Small effusions/ atx  - 02/15/2016  Walked RA x 3 laps @ 185 ft each stopped due to  End of study nl pace/ sob but no desat   Clearly this wasn't just pericarditis that cause her problems but systemic inflammation with effusions, arthritis / serositis in general but clearly also much better now so no need to w/u further than baseline pfts/ rec  I had an extended discussion with the patient reviewing all relevant studies completed to date and  lasting 15 to 20 minutes of a 25 minute visit on the following ongoing concerns:   1) RA can effect the pleura and lung parenchyma as can RA meds  2) needs to ex regularly on a consistent regimen to gauge lung involvement   3) pulmonary f/u can be prn as on no pulmonary meds   4) Each maintenance medication was reviewed in detail including most importantly the difference between maintenance and as needed and under what circumstances the prns are to be used.  Please see instructions for details which were reviewed in writing and the patient given a copy.

## 2016-03-18 NOTE — Patient Instructions (Addendum)
Set up pfts at [facility] for a baseline    To get the most out of exercise, you need to be continuously aware that you are short of breath, but never out of breath, for 30 minutes daily. As you improve, it will actually be easier for you to do the same amount of exercise  in  30 minutes so always push to the level where you are short of breath.    If you start to lose ground doing the same daily activity that could be an early sign of lung involvement with your Rheumatism so call right away for follow up   Pulmonary follow up will be as needed

## 2016-03-18 NOTE — Progress Notes (Signed)
Subjective:    Patient ID: Sydney Wolfe, female    DOB: [birth year],     MRN: [id number]    Brief patient profile:  36 yowf never smoker some rhinitis spring and fall completed allergy shots around 2013/14 and no need for any meds since or ever dx with asthma but bothered by chronic back problems T1/T2 with L shoulder discomfort new pattern of pain around T 6- 8 distribution assoc with sob  and bad arthritis toes knees wrists  starting Nov 2016 dx as sero neg RA/ djd  by Dr Trudie Reed but ? Poor  response to Remicade  So back to meloxicam and orencia  but pain got worse and eventually admitted to [facility]:  Admission date:  02/09/2016    Discharge Date:  02/11/2016   Admission Diagnosis  Nonspecific chest pain [R07.9]   Discharge Diagnosis  Nonspecific chest pain [R07.9]    Active Problems:   Chest pain, mid sternal   Essential hypertension   Abnormal ECG   Chest pain   Rheumatoid arthritis ([place])   Dysphagia   Neck pain          Past Medical History:  Diagnosis Date  . Arthritis   . Gout   . Heart murmur   . 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 Hot flashes   . Hyperlipidemia   . Hypertension   . Hypothyroidism   . Normal nuclear stress test 2004  . OA (osteoarthritis)   . Obesity   . [name] (right bundle branch block)          Past Surgical History:  Procedure Laterality Date  . CARDIAC CATHETERIZATION N/A 02/09/2016   Procedure: Left Heart Cath and Coronary Angiography;  Surgeon: Jettie Booze, MD;  Location: [place] [facility];  Service: Cardiovascular;  Laterality: N/A;  . CARDIOVASCULAR STRESS TEST  08/20/2002   EF 76%  . CARPAL TUNNEL RELEASE    . CHOLECYSTECTOMY  1987  . DILATION AND CURETTAGE OF UTERUS  multiple  . HAND SURGERY  1987 and 1988   bilateral  . SPINE SURGERY  08/2008   T2  . TUBAL LIGATION  1975  . US ECHOCARDIOGRAPHY  07/25/2004   EF 55-60%       History of  present illness and  Hospital Course:     Kindly see H&P for history of present illness and admission details, please review complete Labs, Consult reports and Test reports for all details in brief  HPI  from the history and physical done on the day of admission 02/09/2016  HPI: This is [name] with a history of RA, Hyperlipidemia, HTN who is followed by Dr. Acie Fredrickson for chronic [name] and prior history of CP with normal stress myoview and normal cardiac MRI in 2014. She recently saw him for complaints of DOE and chest pain when climbing stairs and other exertional activities. She described it as a weight on her chest. She underwent nuclear stress test that was low risk with fixed apical defect. Since then she has continued to have DOE. Her and her husband were out yesterday and she developed pain across the back of her neck. This was intermittent in severity throughout the day but never went completely away and radiated into her chest. She was able to sleep last night but this am when she awakened she had sudden onset of severe squeezing pain in her left chest with radiation into her jaw  and left arm heaviness. She came to [facility] ER. In ER she is very uncomfortable complaining of severe CP somewhat worse with palpation of her chest wall and somewhat worse with inspiration. Her initial O2 sats on RA were 97%. She denies any recent long distance travel (last trip to [place] 11/28/2015). She has a strong family history of CAD with her dad having an MI and her brother died of an MI. Cardiology is now asked to evaluate.   Hospital Course  65 y.o.femalewith medical history significant of HTN, rheumatoid arthritis, [name], Admitted with complaints of chest pain, seen by cardiology, status post cardiac cath which did show normal coronaries, has elevated D dimers, CTA chest negative for PE, [facility] hospitalist consulted for noncardiac chest pain.   Chest pain - appears to be musculoskeletal,  cardiac cath with no Significant coronary artery disease, CTA chest negative for PE. - Unclear to his chest pain is related to RA flare, but this is not typical for her previous flares  Rheumatoid arthritis with possible flare - Patient with elevated CRP and ESR, no baseline to compare to, reports improvement on one dose of IV steroids, so will start on prednisone taper .  Dysphagia - Patient reports it is improving, recommendation for outpatient evaluation if persists.  Fever/UTI - Possibly related to atelectasis versus UTI, treated with Rocephin during hospital stay, is another days of oral ciprofloxacin as an outpatient, encouraged to continue using incentive spirometry.  Acute hypoxic respiratory failure - Secondary to atelectasis and diastolic CHF - Patient reports progressive dyspnea over last few weeks, CT chest significant for atelectasis and small pleural effusion, improved with incentive spirometry , is to continue to use as an outpatient, as well evidence of small pleural effusion on CT chest, so started on low dose Lasix on discharge especially with evidence of grade 1 diastolic CHF on 2-D echo . - Resolved, oxygen saturation 91% on ambulation - Has a follow-up appointment with Dr. Melvyn Novas this Thursday  Cervical radiculopathy - MRI significant for radiculopathy, discussed with Dr. Cyndy Freeze, this can be followed as an outpatient     02/15/2016 1st [facility] office visit/ Kerry Chisolm   Chief Complaint  Patient presents with  . Pulmonary Consult    Referred by Dr. Gavin Pound. Pt c/o SOB and CP since Nov 2016. She states her chest hurts when she takes a deep breath. She states "I can't walk anywhere without getting SOB some days".   started prednisone in hospital but no better  Doe x across the room  Sleeps right side down Leg swelling was an issue x years but better on lasix s improvement in sob  Dysphagia variably better Cp migratory as is arthritis  - cp lasts minutes  to hours s pattern but may be better supine  rec Please remember to go to the   x-ray department downstairs for your tests - we will call you with the results when they are available. Try prilosec otc 49m  Take 30-60 min before first meal of the day and Pepcid ac (famotidine) 20 mg one @  bedtime until return Continue Incentive spirometry as much as you can Continue the gabapentin - may need to adjust up  Treatment consists of avoiding foods that cause gas (especially boiled eggs/ mPolandfood/ beans and raw vegetables like spinach and salads)  and citrucel 1 heaping tsp twice daily with a large glass of water.  Pain should improve w/in 2 weeks and if not then consider further GI work up.  GERD diet    03/18/2016  f/u ov/Jaylan Duggar re:  Chief Complaint  Patient presents with  . Follow-up    CP has resolved and her breathing has improved some.  Pt states that she was admitted to the hospital in [place] for pericarditis and a fib 02/16/16   fatigue > sob since late summer 2017  Now newly on colchicine in addition to continuing  orencia infusions  Since admit to [facility] with dx pericarditis  Hx is still of migratory  Cp better lying down (not at all typical of pericarditis) but note overall arthritis symptoms also improved and Not limited by breathing from desired activities    No obvious day to day or daytime variability or assoc chronic cough or cp or chest tightness, subjective wheeze or overt sinus or hb symptoms. No unusual exp hx or h/o childhood pna/ asthma or knowledge of premature birth.  Sleeping ok without nocturnal  or early am exacerbation  of respiratory  c/o's or need for noct saba. Also denies any obvious fluctuation of symptoms with weather or environmental changes or other aggravating or alleviating factors except as outlined above   Current Medications, Allergies, Complete Past Medical History, Past Surgical History, Family History, and Social History were reviewed in  [employer] record.  ROS  The following are not active complaints unless bolded sore throat, dysphagia, dental problems, itching, sneezing,  nasal congestion or excess/ purulent secretions, ear ache,   fever, chills, sweats, unintended wt loss, classically pleuritic or exertional cp, hemoptysis,  orthopnea pnd or leg swelling, presyncope, palpitations, abdominal pain, anorexia, nausea, vomiting, diarrhea  or change in bowel or bladder habits, change in stools or urine, dysuria,hematuria,  rash, arthralgias, visual complaints, headache, numbness, weakness or ataxia or problems with walking or coordination,  change in mood/affect or memory.                      Objective:   Physical Exam   amb obese wf nad - - Note on arrival 02 sats  98% on RA     03/18/2016     206   02/15/16 210 lb (95.3 kg)  02/09/16 200 lb (90.7 kg)  12/20/15 217 lb (98.4 kg)    Vital signs reviewed    HEENT: nl dentition, turbinates, and oropharynx. Nl external ear canals without cough reflex   NECK :  without JVD/Nodes/TM/ nl carotid upstrokes bilaterally   LUNGS: no acc muscle use,  Nl contour chest which is clear to A and P bilaterally without cough on insp or exp maneuvers   CV:  RRR  no s3 or murmur or increase in P2, no edema   ABD:  soft and nontender with nl inspiratory excursion in the supine position. No bruits or organomegaly, bowel sounds nl  MS:  Nl gait/ ext warm without deformities, calf tenderness, cyanosis or clubbing No obvious joint restrictions   SKIN: warm and dry without lesions    NEURO:  alert, approp, nl sensorium with  no motor deficits       I personally reviewed images and agree with radiology impression as follows:  CTa Chest    02/09/16 No evidence of pulmonary emboli. Cardiomegaly with ectatic ascending thoracic aorta. Small bilateral pleural effusions and moderate bilateral lower lobe atelectasis.    CXR PA and Lateral:    02/15/2016 :    I personally reviewed images and agree with radiology impression as follows:   Cardiac shadow remains mildly enlarged. The  lungs are well aerated bilaterally. Minimal residual atelectatic changes are noted in the bases although no sizable effusion or focal infiltrate is seen. No bony abnormality is noted.    Lab Results  Component Value Date   ESRSEDRATE 50 (H) 02/09/2016        Assessment & Plan:

## 2016-03-19 ENCOUNTER — Ambulatory Visit (INDEPENDENT_AMBULATORY_CARE_PROVIDER_SITE_OTHER): Payer: Medicare Other | Admitting: Cardiovascular Disease

## 2016-03-19 ENCOUNTER — Telehealth: Payer: Self-pay

## 2016-03-19 ENCOUNTER — Encounter: Payer: Self-pay | Admitting: Cardiovascular Disease

## 2016-03-19 ENCOUNTER — Other Ambulatory Visit: Payer: Self-pay | Admitting: *Deleted

## 2016-03-19 ENCOUNTER — Telehealth: Payer: Self-pay | Admitting: Cardiovascular Disease

## 2016-03-19 VITALS — BP 136/80 | HR 87 | Ht 70.0 in | Wt 204.8 lb

## 2016-03-19 DIAGNOSIS — I10 Essential (primary) hypertension: Secondary | ICD-10-CM

## 2016-03-19 DIAGNOSIS — I5032 Chronic diastolic (congestive) heart failure: Secondary | ICD-10-CM | POA: Diagnosis not present

## 2016-03-19 DIAGNOSIS — I309 Acute pericarditis, unspecified: Secondary | ICD-10-CM | POA: Insufficient documentation

## 2016-03-19 DIAGNOSIS — I300 Acute nonspecific idiopathic pericarditis: Secondary | ICD-10-CM | POA: Diagnosis not present

## 2016-03-19 DIAGNOSIS — I480 Paroxysmal atrial fibrillation: Secondary | ICD-10-CM | POA: Diagnosis not present

## 2016-03-19 MED ORDER — RIVAROXABAN 20 MG PO TABS: 20.0000 mg | ORAL_TABLET | Freq: Every day | ORAL | 11 refills | Status: DC

## 2016-03-19 MED ORDER — DILTIAZEM HCL ER COATED BEADS 180 MG PO CP24: 180.0000 mg | ORAL_CAPSULE | Freq: Every day | ORAL | 0 refills | Status: DC

## 2016-03-19 NOTE — Telephone Encounter (Signed)
Prior auth for Xarelto 20mg  obtained from [facility]. [place]. Local pharmacy notified.

## 2016-03-19 NOTE — Patient Instructions (Signed)
Medication Instructions:  START Xarelto 20 mg once daily at dinner time   Labwork: Your physician recommends that you return for lab work in: 1 month for CBC, basic metabolic panel   Testing/Procedures: None Ordered   Follow-Up: Your physician recommends that you schedule a follow-up appointment in: 3 months with Dr. Elease Hashimoto.    If you need a refill on your cardiac medications before your next appointment, please call your pharmacy.   Thank you for choosing [facility]! Eligha Bridegroom, RN [phone]

## 2016-03-19 NOTE — Progress Notes (Signed)
[name] Date of Birth  02-25-1951 [facility]     [place] Office  [address]    [address]   [address] [place], [place]  [address]    [place], [place]  [address] [phone]  Fax  [phone]  [phone]  Fax [phone]  Problems: 1. Hypertension 2. Hyperlipidemia 3. Mild obesity. 4.  [name] 5. Rheumatoid arthritis  6. Hypothyroidism   [name] is a 65 y.o. female with the above noted hx.  She has done very well. She has not had any episodes of chest pain or shortness breath. She has been exercising on regular basis.  November 17, 2012:  She is tolerating the coreg well but has noticed some photosensitivity ( listed by epocrates also as a side effect)  Nov. 20, 2014:  Shelisa was admitted to the hosptial with CP several weeks ago.  She rule out of MI,  MRI of the heart was normal.  Stress myoview 04/13/13 was normal.   She had continued to have fatigue, variable BP.   She also has dyspnea.  Her 2 sisters have cardiac issues [name] Reavis)   Oct 04, 2013:  Rochel is doing well.  Exercising reguarly.   She is tolerating the coreg.    December 15, 2014:  December 14, 2015:  Doing well from a cardiac standpoint She's been having more problems with her rheumatoid arthritis. She's now on Remicade infusions and takes naproxen on a regular basis. She's been getting exercise on a regular basis. She rides her stationary bike 4 times a week.  She has some shortness of breath and chest tightness with exertion -particularly when she climbs stairs. Like a weight on her chest ,  Pressure like sensation .  Resolves after she stops for about 30 seconds.    Oct. 24, 2017:  Was hospitalized in Sept. with pericarditis and atrial fib .   Was likely due to Rheumatoid arthritis .  She was originally hospitalized at [facility] in mid September with chest pain. Her cardiac enzymes were normal. Card cath revealed smooth and normal coronary arteries.  She was  discharged but then when she saw her medical doctor she was readmitted to [facility] and was found at pericarditis and atrial fibrillation.   She is slowly regaining her strength. She still has an unusual sensation in her chest. The pain is not nearly as bad as it was a month ago but she is still not quite back to her normal self.    Current Outpatient Prescriptions on File Prior to Visit  Medication Sig Dispense Refill  . BETIMOL 0.5 % ophthalmic solution Apply 1 drop to eye 2 (two) times daily.     [name] CARTIA XT 180 MG 24 hr capsule Take 180 mg by mouth daily.   0  . colchicine 0.6 MG tablet Take 1 tablet (0.6 mg total) by mouth 2 (two) times daily. 60 tablet 3  . flecainide (TAMBOCOR) 100 MG tablet Take 1 tablet (100 mg total) by mouth 2 (two) times daily. 60 tablet 1  . levothyroxine (SYNTHROID, LEVOTHROID) 50 MCG tablet Take 1 tablet by mouth daily.  5  . meloxicam (MOBIC) 15 MG tablet Take 1 tablet by mouth daily.  3  . nitroGLYCERIN (NITROSTAT) 0.4 MG SL tablet Place 1 tablet (0.4 mg total) under the tongue every 5 (five) minutes as needed for chest pain. 25 tablet 3  . rosuvastatin (CRESTOR) 5 MG tablet Take 5 mg by  mouth every other day.     No current facility-administered medications on file prior to visit.     No Known Allergies  Past Medical History:  Diagnosis Date  . Arthritis   . Gout   . Heart murmur   . 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 Hot flashes   . Hyperlipidemia   . Hypertension   . Hypothyroidism   . Normal nuclear stress test 2004  . OA (osteoarthritis)   . Obesity   . [name] (right bundle branch block)     Past Surgical History:  Procedure Laterality Date  . CARDIAC CATHETERIZATION N/A 02/09/2016   Procedure: Left Heart Cath and Coronary Angiography;  Surgeon: Corky Crafts, MD;  Location: [facility];  Service: Cardiovascular;  Laterality: N/A;  . CARDIOVASCULAR STRESS TEST  08/20/2002   EF  76%  . CARPAL TUNNEL RELEASE    . CHOLECYSTECTOMY  1987  . DILATION AND CURETTAGE OF UTERUS  multiple  . HAND SURGERY  1987 and 1988   bilateral  . SPINE SURGERY  08/2008   T2  . TUBAL LIGATION  1975  . US ECHOCARDIOGRAPHY  07/25/2004   EF 55-60%    History  Smoking Status  . Never Smoker  Smokeless Tobacco  . Never Used    History  Alcohol Use No    Family History  Problem Relation Age of Onset  . Heart failure Mother   . Rheum arthritis Mother   . Hypertension Father   . Stroke Father   . Diabetes Father   . Rheum arthritis Father     Reviw of Systems:  Reviewed in the HPI.  All other systems are negative.  Physical Exam: Blood pressure 136/80, pulse 87, height 5\' 10"  (1.778 m), weight 204 lb 12.8 oz (92.9 kg), SpO2 95 %. General: Well developed, well nourished, in no acute distress.  Head: Normocephalic, atraumatic, sclera non-icteric, mucus membranes are moist,   Neck: Supple. Carotids are 2 + without bruits. No JVD  Lungs: Clear bilaterally to auscultation.  Heart: regular rate.  normal  S1 S2. No murmurs, gallops or rubs.  Abdomen: Soft, non-tender, non-distended with normal bowel sounds. No hepatomegaly. No rebound/guarding. No masses.  Msk:  Strength and tone are normal  Extremities: No clubbing or cyanosis. No edema.  Distal pedal pulses are 2+ and equal bilaterally.  Neuro: Alert and oriented X 3. Moves all extremities spontaneously.  Psych:  Responds to questions appropriately with a normal affect.  ECG: December 14, 2015:  NSR at 40.  [name]   Assessment / Plan:   1.   PAF -  the patient was admitted to [facility] with acute pericarditis and paroxysmal atrial fibrillation. She was seen by [facility]. [facility].  She converted on diltiazem and flecainide. We made the decision not to start Xarelto at that time since she had acute pericarditis and there was a risk of developing a hemorrhagic pericardial effusion. She has been stable and has been  improving for the past several months. I think that we can safely start her on Xarelto today. CBC and basic medical profile in one month.  We'll continue the current dose of flecainide and diltiazem.  2. Actue pericarditis .  She presented to [facility] with acute chest pain in mid September. Her cardiac enzymes were negative. Cardiac catheterization revealed normal coronary arteries. She was later found to have acute pericarditis likely related to her  rheumatoid arthritis. She was treated with colchicine. Her rheumatologist has increased her treatment of her rheumatoid arthritis and her symptoms seemed to be gradually improving.  2. Hypertension - her blood pressure is well-controlled. Continue current medications.  3. Hyperlipidemia her lipids have been well controlled. Will draw fasting lipids, liver enzymes, and basic medical profile today.  4. Mild obesity.  5. [name]:        [name], MD  03/19/2016 10:39 AM    [facility] [address] [place],  [address] [place], [place]  [address] Pager [phone] Phone: [phone]; Fax: [phone]

## 2016-03-19 NOTE — Telephone Encounter (Signed)
New Message   *STAT* If patient is at the pharmacy, call can be transferred to refill team.   1. Which medications need to be refilled? (please list name of each medication and dose if known)  Cartia XT 180 MG 24 hr Capsule by mouth daily  2. Which pharmacy/location (including street and city if local pharmacy) is medication to be sent to? [id number], [address], [place], [place]  3. Do they need a 30 day or 90 day supply? 90 day supply  Pt voiced she was taking this medication while she was in the hospital and was informed to continue to take this and she only has one pill left from hospital.

## 2016-03-27 ENCOUNTER — Ambulatory Visit (HOSPITAL_COMMUNITY)
Admission: RE | Admit: 2016-03-27 | Discharge: 2016-03-27 | Disposition: A | Discharge status: Home / Self Care | Payer: Medicare Other | Source: Ambulatory Visit | Attending: Internal Medicine | Admitting: Internal Medicine

## 2016-03-27 DIAGNOSIS — R0602 Shortness of breath: Secondary | ICD-10-CM | POA: Diagnosis present

## 2016-03-27 DIAGNOSIS — J988 Other specified respiratory disorders: Secondary | ICD-10-CM | POA: Insufficient documentation

## 2016-03-27 LAB — PULMONARY FUNCTION TEST
DL/VA % PRED: 82 %
DL/VA: 4.46 ml/min/mmHg/L
DLCO UNC: 17.33 ml/min/mmHg
DLCO unc % pred: 53 %
FEF 25-75 POST: 2.5 L/s
FEF 25-75 Pre: 2 L/sec
FEF2575-%Change-Post: 25 %
FEF2575-%PRED-POST: 103 %
FEF2575-%Pred-Pre: 82 %
FEV1-%CHANGE-POST: 8 %
FEV1-%PRED-PRE: 63 %
FEV1-%Pred-Post: 69 %
FEV1-POST: 2.06 L
FEV1-PRE: 1.9 L
FEV1FVC-%CHANGE-POST: 7 %
FEV1FVC-%Pred-Pre: 102 %
FEV6-%Change-Post: 4 %
FEV6-%PRED-PRE: 62 %
FEV6-%Pred-Post: 65 %
FEV6-POST: 2.44 L
FEV6-Pre: 2.33 L
FEV6FVC-%PRED-POST: 104 %
FEV6FVC-%Pred-Pre: 104 %
FVC-%Change-Post: 1 %
FVC-%PRED-POST: 62 %
FVC-%PRED-PRE: 61 %
FVC-POST: 2.44 L
FVC-PRE: 2.4 L
POST FEV6/FVC RATIO: 100 %
PRE FEV1/FVC RATIO: 79 %
PRE FEV6/FVC RATIO: 100 %
Post FEV1/FVC ratio: 85 %
RV % PRED: 83 %
RV: 2.02 L
TLC % pred: 75 %
TLC: 4.5 L

## 2016-03-27 MED ORDER — ALBUTEROL SULFATE (2.5 MG/3ML) 0.083% IN NEBU
2.5000 mg | INHALATION_SOLUTION | Freq: Once | RESPIRATORY_TRACT | Status: AC
  Administered 2016-03-27: 2.5 mg via RESPIRATORY_TRACT

## 2016-04-02 ENCOUNTER — Telehealth: Payer: Self-pay | Admitting: Internal Medicine

## 2016-04-02 NOTE — Telephone Encounter (Signed)
Sorry for the delay, it's not entered in the system electronically yet but I am able to see it and it shows the effects of her weight on her diaphragm and that's all  - if not making progress with regular walking the only other test we haven't done is cpst, ok to order if she wants or return p tgiving to regroup if not happy with progress

## 2016-04-02 NOTE — Telephone Encounter (Signed)
Pt had PFT on 03-27-16 and is requesting those results.  MW please advise. Thanks.

## 2016-04-02 NOTE — Telephone Encounter (Signed)
Pt aware of results. Pt had several questions as to why her weight on her diaphragm would be causing her issues as she did not develop these symptoms until she got sick. I have scheduled pt to come in on 04-04-16 to see MW to better discuss these results. Nothing further needed.

## 2016-04-04 ENCOUNTER — Ambulatory Visit (INDEPENDENT_AMBULATORY_CARE_PROVIDER_SITE_OTHER): Payer: Medicare Other | Admitting: Internal Medicine

## 2016-04-04 ENCOUNTER — Encounter: Payer: Self-pay | Admitting: Internal Medicine

## 2016-04-04 VITALS — BP 120/80 | HR 83 | Ht 70.0 in | Wt 209.0 lb

## 2016-04-04 DIAGNOSIS — R0602 Shortness of breath: Secondary | ICD-10-CM | POA: Diagnosis not present

## 2016-04-04 DIAGNOSIS — M069 Rheumatoid arthritis, unspecified: Secondary | ICD-10-CM | POA: Diagnosis not present

## 2016-04-04 NOTE — Patient Instructions (Addendum)
To get the most out of exercise, you need to be continuously aware that you are short of breath, but never out of breath, for 30 minutes daily. As you improve, it will actually be easier for you to do the same amount of exercise  in  30 minutes so always push to the level where you are short of breath.     Please schedule a follow up visit in 6  months but call sooner if needed with pfts here on return

## 2016-04-04 NOTE — Progress Notes (Signed)
Subjective:    Patient ID: Sydney Wolfe, female    DOB: [birth year],     MRN: [id number]    Brief patient profile:  36 yowf never smoker some rhinitis spring and fall completed allergy shots around 2013/14 and no need for any meds since or ever dx with asthma but bothered by chronic back problems T1/T2 with L shoulder discomfort new pattern of pain around T 6- 8 distribution assoc with sob  and bad arthritis toes knees wrists  starting Nov 2016 dx as sero neg RA/ djd  by Dr Trudie Reed but ? Poor  response to Remicade  So back to meloxicam and orencia  but pain got worse and eventually admitted to [facility]:  Admission date:  02/09/2016    Discharge Date:  02/11/2016   Admission Diagnosis  Nonspecific chest pain [R07.9]   Discharge Diagnosis  Nonspecific chest pain [R07.9]    Active Problems:   Chest pain, mid sternal   Essential hypertension   Abnormal ECG   Chest pain   Rheumatoid arthritis ([place])   Dysphagia   Neck pain          Past Medical History:  Diagnosis Date  . Arthritis   . Gout   . Heart murmur   . 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 Hot flashes   . Hyperlipidemia   . Hypertension   . Hypothyroidism   . Normal nuclear stress test 2004  . OA (osteoarthritis)   . Obesity   . [name] (right bundle branch block)          Past Surgical History:  Procedure Laterality Date  . CARDIAC CATHETERIZATION N/A 02/09/2016   Procedure: Left Heart Cath and Coronary Angiography;  Surgeon: Jettie Booze, MD;  Location: [place] [facility];  Service: Cardiovascular;  Laterality: N/A;  . CARDIOVASCULAR STRESS TEST  08/20/2002   EF 76%  . CARPAL TUNNEL RELEASE    . CHOLECYSTECTOMY  1987  . DILATION AND CURETTAGE OF UTERUS  multiple  . HAND SURGERY  1987 and 1988   bilateral  . SPINE SURGERY  08/2008   T2  . TUBAL LIGATION  1975  . US ECHOCARDIOGRAPHY  07/25/2004   EF 55-60%       History of  present illness and  Hospital Course:     Kindly see H&P for history of present illness and admission details, please review complete Labs, Consult reports and Test reports for all details in brief  HPI  from the history and physical done on the day of admission 02/09/2016  HPI: This is [name] with a history of RA, Hyperlipidemia, HTN who is followed by Dr. Acie Fredrickson for chronic [name] and prior history of CP with normal stress myoview and normal cardiac MRI in 2014. She recently saw him for complaints of DOE and chest pain when climbing stairs and other exertional activities. She described it as a weight on her chest. She underwent nuclear stress test that was low risk with fixed apical defect. Since then she has continued to have DOE. Her and her husband were out yesterday and she developed pain across the back of her neck. This was intermittent in severity throughout the day but never went completely away and radiated into her chest. She was able to sleep last night but this am when she awakened she had sudden onset of severe squeezing pain in her left chest with radiation into her jaw  and left arm heaviness. She came to [facility] ER. In ER she is very uncomfortable complaining of severe CP somewhat worse with palpation of her chest wall and somewhat worse with inspiration. Her initial O2 sats on RA were 97%. She denies any recent long distance travel (last trip to [place] 11/28/2015). She has a strong family history of CAD with her dad having an MI and her brother died of an MI. Cardiology is now asked to evaluate.   Hospital Course  65 y.o.femalewith medical history significant of HTN, rheumatoid arthritis, [name], Admitted with complaints of chest pain, seen by cardiology, status post cardiac cath which did show normal coronaries, has elevated D dimers, CTA chest negative for PE, [facility] hospitalist consulted for noncardiac chest pain.   Chest pain - appears to be musculoskeletal,  cardiac cath with no Significant coronary artery disease, CTA chest negative for PE. - Unclear to his chest pain is related to RA flare, but this is not typical for her previous flares  Rheumatoid arthritis with possible flare - Patient with elevated CRP and ESR, no baseline to compare to, reports improvement on one dose of IV steroids, so will start on prednisone taper .  Dysphagia - Patient reports it is improving, recommendation for outpatient evaluation if persists.  Fever/UTI - Possibly related to atelectasis versus UTI, treated with Rocephin during hospital stay, is another days of oral ciprofloxacin as an outpatient, encouraged to continue using incentive spirometry.  Acute hypoxic respiratory failure - Secondary to atelectasis and diastolic CHF - Patient reports progressive dyspnea over last few weeks, CT chest significant for atelectasis and small pleural effusion, improved with incentive spirometry , is to continue to use as an outpatient, as well evidence of small pleural effusion on CT chest, so started on low dose Lasix on discharge especially with evidence of grade 1 diastolic CHF on 2-D echo . - Resolved, oxygen saturation 91% on ambulation - Has a follow-up appointment with Dr. Melvyn Novas this Thursday  Cervical radiculopathy - MRI significant for radiculopathy, discussed with Dr. Cyndy Freeze, this can be followed as an outpatient     02/15/2016 1st [facility] office visit/ [name]   Chief Complaint  Patient presents with  . Pulmonary Consult    Referred by Dr. Gavin Pound. Pt c/o SOB and CP since Nov 2016. She states her chest hurts when she takes a deep breath. She states "I can't walk anywhere without getting SOB some days".   started prednisone in hospital but no better  Doe x across the room  Sleeps right side down Leg swelling was an issue x years but better on lasix s improvement in sob  Dysphagia variably better Cp migratory as is arthritis  - cp lasts minutes  to hours s pattern but may be better supine  rec Please remember to go to the   x-ray department downstairs for your tests - we will call you with the results when they are available. Try prilosec otc 49m  Take 30-60 min before first meal of the day and Pepcid ac (famotidine) 20 mg one @  bedtime until return Continue Incentive spirometry as much as you can Continue the gabapentin - may need to adjust up  Treatment consists of avoiding foods that cause gas (especially boiled eggs/ mPolandfood/ beans and raw vegetables like spinach and salads)  and citrucel 1 heaping tsp twice daily with a large glass of water.  Pain should improve w/in 2 weeks and if not then consider further GI work up.  GERD diet    03/18/2016  f/u ov/Ia Leeb re:  Chief Complaint  Patient presents with  . Follow-up    CP has resolved and her breathing has improved some.  Pt states that she was admitted to the hospital in [place] for pericarditis and a fib 02/16/16  Fatigue > sob since late summer 2017  Now newly on colchicine in addition to continuing  orencia infusions  Since admit to [facility] with dx pericarditis rec Set up pfts at [facility] hospital for a baseline  To get the most out of exercise, you need to be continuously    . 04/04/2016  f/u ov/Fiore Detjen re: RA poor control  Chief Complaint  Patient presents with  . Follow-up    Here to review PFT results. She states breathing still not at her normal baseline. No new co's today.   doe across a football field but varies quite a bit  Just started doing ex bike x 30mn moderate resistance (lower than she was) and speed a little slower than baseline    No obvious day to day or daytime variability or assoc chronic cough or cp or chest tightness, subjective wheeze or overt sinus or hb symptoms. No unusual exp hx or h/o childhood pna/ asthma or knowledge of premature birth.  Sleeping ok without nocturnal  or early am exacerbation  of respiratory  c/o's or  need for noct saba. Also denies any obvious fluctuation of symptoms with weather or environmental changes or other aggravating or alleviating factors except as outlined above   Current Medications, Allergies, Complete Past Medical History, Past Surgical History, Family History, and Social History were reviewed in [employer].  ROS  The following are not active complaints unless bolded sore throat, dysphagia, dental problems, itching, sneezing,  nasal congestion or excess/ purulent secretions, ear ache,   fever, chills, sweats, unintended wt loss, classically pleuritic or exertional cp, hemoptysis,  orthopnea pnd or leg swelling, presyncope, palpitations, abdominal pain, anorexia, nausea, vomiting, diarrhea  or change in bowel or bladder habits, change in stools or urine, dysuria,hematuria,  rash, arthralgias, visual complaints, headache, numbness, weakness or ataxia or problems with walking or coordination,  change in mood/affect or memory.                      Objective:   Physical Exam   amb obese wf nad - - Note on arrival 02 sats  97% on RA    04/04/2016        209  03/18/2016     206   02/15/16 210 lb (95.3 kg)  02/09/16 200 lb (90.7 kg)  12/20/15 217 lb (98.4 kg)    Vital signs reviewed    HEENT: nl dentition, turbinates, and oropharynx. Nl external ear canals without cough reflex   NECK :  without JVD/Nodes/TM/ nl carotid upstrokes bilaterally   LUNGS: no acc muscle use,  Nl contour chest which is clear to A and P bilaterally without cough on insp or exp maneuvers   CV:  RRR  no s3 or murmur or increase in P2, no edema   ABD:  soft and nontender with nl inspiratory excursion in the supine position. No bruits or organomegaly, bowel sounds nl  MS:  Nl gait/ ext warm without deformities, calf tenderness, cyanosis or clubbing No obvious joint restrictions   SKIN: warm and dry without lesions    NEURO:  alert, approp, nl sensorium with   no motor deficits  I personally reviewed images and agree with radiology impression as follows:  CTa Chest    02/09/16 No evidence of pulmonary emboli. Cardiomegaly with ectatic ascending thoracic aorta. Small bilateral pleural effusions and moderate bilateral lower lobe atelectasis.    CXR PA and Lateral:   02/15/2016 :    I personally reviewed images and agree with radiology impression as follows:   Cardiac shadow remains mildly enlarged. The lungs are well aerated bilaterally. Minimal residual atelectatic changes are noted in the bases although no sizable effusion or focal infiltrate is seen. No bony abnormality is noted.    Lab Results  Component Value Date   ESRSEDRATE 50 (H) 02/09/2016        Assessment & Plan:

## 2016-04-07 NOTE — Assessment & Plan Note (Signed)
Reviewed with pt the risk of RA related lung dz and what to be on the lookout for going forward  Key is to stay as active as possible and let me know if losing ground with ex tol on basis of sob

## 2016-04-07 NOTE — Assessment & Plan Note (Signed)
02/09/16 LHC neg CTa  02/09/16  Small effusions/ atx  - 02/15/2016  Walked RA x 3 laps @ 185 ft each stopped due to  End of study nl pace/ sob but no desat - PFT's  03/27/16  FVC  2.40 (62%)  s obst/ DLCO 53% corrects to 82% and ERV 20% c/w effects of obesity   No evidence of RA lung dz/ main issue is orthopedic limitations and effects of mild obesity/ deconditioning

## 2016-04-16 ENCOUNTER — Other Ambulatory Visit: Payer: Medicare Other | Admitting: *Deleted

## 2016-04-16 DIAGNOSIS — I10 Essential (primary) hypertension: Secondary | ICD-10-CM

## 2016-04-16 DIAGNOSIS — I5032 Chronic diastolic (congestive) heart failure: Secondary | ICD-10-CM

## 2016-04-16 DIAGNOSIS — I480 Paroxysmal atrial fibrillation: Secondary | ICD-10-CM

## 2016-04-16 LAB — CBC WITH DIFFERENTIAL/PLATELET
BASOS PCT: 0 %
Basophils Absolute: 0 cells/uL (ref 0–200)
EOS ABS: 158 {cells}/uL (ref 15–500)
Eosinophils Relative: 2 %
HCT: 42 % (ref 35.0–45.0)
Hemoglobin: 14 g/dL (ref 11.7–15.5)
LYMPHS PCT: 45 %
Lymphs Abs: 3555 cells/uL (ref 850–3900)
MCH: 30.7 pg (ref 27.0–33.0)
MCHC: 33.3 g/dL (ref 32.0–36.0)
MCV: 92.1 fL (ref 80.0–100.0)
MONOS PCT: 10 %
MPV: 10.8 fL (ref 7.5–12.5)
Monocytes Absolute: 790 cells/uL (ref 200–950)
Neutro Abs: 3397 cells/uL (ref 1500–7800)
Neutrophils Relative %: 43 %
PLATELETS: 219 10*3/uL (ref 140–400)
RBC: 4.56 MIL/uL (ref 3.80–5.10)
RDW: 14.6 % (ref 11.0–15.0)
WBC: 7.9 10*3/uL (ref 3.8–10.8)

## 2016-04-16 LAB — BASIC METABOLIC PANEL
BUN: 13 mg/dL (ref 7–25)
CALCIUM: 9.1 mg/dL (ref 8.6–10.4)
CHLORIDE: 105 mmol/L (ref 98–110)
CO2: 24 mmol/L (ref 20–31)
CREATININE: 0.82 mg/dL (ref 0.50–0.99)
Glucose, Bld: 92 mg/dL (ref 65–99)
Potassium: 4 mmol/L (ref 3.5–5.3)
Sodium: 138 mmol/L (ref 135–146)

## 2016-05-12 ENCOUNTER — Other Ambulatory Visit: Payer: Self-pay | Admitting: Physician Assistant

## 2016-06-19 ENCOUNTER — Encounter: Payer: Self-pay | Admitting: Cardiovascular Disease

## 2016-06-19 ENCOUNTER — Ambulatory Visit (INDEPENDENT_AMBULATORY_CARE_PROVIDER_SITE_OTHER): Payer: Medicare Other | Admitting: Cardiovascular Disease

## 2016-06-19 ENCOUNTER — Other Ambulatory Visit: Payer: Self-pay | Admitting: Cardiovascular Disease

## 2016-06-19 VITALS — BP 120/80 | HR 62 | Ht 70.0 in | Wt 211.0 lb

## 2016-06-19 DIAGNOSIS — I309 Acute pericarditis, unspecified: Secondary | ICD-10-CM

## 2016-06-19 DIAGNOSIS — I480 Paroxysmal atrial fibrillation: Secondary | ICD-10-CM | POA: Diagnosis not present

## 2016-06-19 MED ORDER — NITROGLYCERIN 0.4 MG SL SUBL: 0.4000 mg | SUBLINGUAL_TABLET | SUBLINGUAL | 3 refills | Status: AC | PRN

## 2016-06-19 MED ORDER — ROSUVASTATIN CALCIUM 5 MG PO TABS: 5.0000 mg | ORAL_TABLET | ORAL | 3 refills | Status: DC

## 2016-06-19 MED ORDER — FLECAINIDE ACETATE 100 MG PO TABS: 100.0000 mg | ORAL_TABLET | Freq: Two times a day (BID) | ORAL | 3 refills | Status: DC

## 2016-06-19 MED ORDER — RIVAROXABAN 20 MG PO TABS: 20.0000 mg | ORAL_TABLET | Freq: Every day | ORAL | 3 refills | Status: DC

## 2016-06-19 MED ORDER — DILTIAZEM HCL ER COATED BEADS 180 MG PO CP24: 180.0000 mg | ORAL_CAPSULE | Freq: Every day | ORAL | 3 refills | Status: DC

## 2016-06-19 MED ORDER — COLCHICINE 0.6 MG PO TABS: 0.6000 mg | ORAL_TABLET | Freq: Two times a day (BID) | ORAL | 3 refills | Status: DC | PRN

## 2016-06-19 NOTE — Progress Notes (Signed)
Sydney Wolfe Date of Birth  [birth year] [facility]     [place] [facility]  [address]    [address]   [address] [place], [place]  [address]    [place], [place]  [address] [phone]  Fax  [phone]  [phone]  Fax [phone]  Problems: 1. Hypertension 2. Hyperlipidemia 3. Mild obesity. 4.  [name] 5. Rheumatoid arthritis  6. Hypothyroidism   Sydney Wolfe is a 66 y.o. female with the above noted hx.  She has done very well. She has not had any episodes of chest pain or shortness breath. She has been exercising on regular basis.  November 17, 2012:  She is tolerating the coreg well but has noticed some photosensitivity ( listed by epocrates also as a side effect)  Nov. 20, 2014:  Caedence was admitted to the hosptial with CP several weeks ago.  She rule out of MI,  MRI of the heart was normal.  Stress myoview 04/13/13 was normal.   She had continued to have fatigue, variable BP.   She also has dyspnea.  Her 2 sisters have cardiac issues [name] Reavis)   Oct 04, 2013:  Sydney Wolfe is doing well.  Exercising reguarly.   She is tolerating the coreg.    December 15, 2014:  December 14, 2015:  Doing well from a cardiac standpoint She's been having more problems with her rheumatoid arthritis. She's now on Remicade infusions and takes naproxen on a regular basis. She's been getting exercise on a regular basis. She rides her stationary bike 4 times a week.  She has some shortness of breath and chest tightness with exertion -particularly when she climbs stairs. Like a weight on her chest ,  Pressure like sensation .  Resolves after she stops for about 30 seconds.    Oct. 24, 2017:  Was hospitalized in Sept. with pericarditis and atrial fib .   Was likely due to Rheumatoid arthritis .  She was originally hospitalized at [facility] in mid September with chest pain. Her cardiac enzymes were normal. Cardiac  cath revealed smooth and normal coronary arteries.  She  was discharged but then when she saw her medical doctor she was readmitted to [facility] and was found at pericarditis and atrial fibrillation.   She is slowly regaining her strength. She still has an unusual sensation in her chest. The pain is not nearly as bad as it was a month ago but she is still not quite back to her normal self.   Jan. 24, 2018: Sydney Wolfe is seen today .  Has remained in NSR .  Has some chronic dyspnea,  Sees Dr. Sherene Sires at [facility] .   Current Outpatient Prescriptions on File Prior to Visit  Medication Sig Dispense Refill  . Abatacept (ORENCIA IV) Inject into the vein as directed.    [name] acetaminophen (TYLENOL) 650 MG CR tablet Take 650 mg by mouth every 8 (eight) hours as needed for pain.    [name] BETIMOL 0.5 % ophthalmic solution Apply 1 drop to eye 2 (two) times daily.     . colchicine 0.6 MG tablet Take 1 tablet (0.6 mg total) by mouth 2 (two) times daily. 60 tablet 3  . diltiazem (CARTIA XT) 180 MG 24 hr capsule Take 1 capsule (180 mg total) by mouth daily. 90 capsule 0  . flecainide (TAMBOCOR) 100 MG tablet Take 1 tablet (100 mg total) by mouth 2 (two) times daily. 60 tablet 11  .  levothyroxine (SYNTHROID, LEVOTHROID) 50 MCG tablet Take 1 tablet by mouth daily.  5  . Melatonin 5 MG TABS Take 1 tablet by mouth at bedtime.    . Methylcobalamin (B-12) 5000 MCG TBDP Take 1 tablet by mouth daily.    . nitroGLYCERIN (NITROSTAT) 0.4 MG SL tablet Place 1 tablet (0.4 mg total) under the tongue every 5 (five) minutes as needed for chest pain. 25 tablet 3  . rivaroxaban (XARELTO) 20 MG TABS tablet Take 1 tablet (20 mg total) by mouth daily with supper. 30 tablet 11  . rosuvastatin (CRESTOR) 5 MG tablet Take 5 mg by mouth every other day.     No current facility-administered medications on file prior to visit.     No Known Allergies  Past Medical History:  Diagnosis Date  . Arthritis   . Gout   . Heart murmur   . History of Horner's syndrome    following  spinal surgery on T2 in 2010  . Hot flashes   . Hyperlipidemia   . Hypertension   . Hypothyroidism   . Normal nuclear stress test 2004  . OA (osteoarthritis)   . Obesity   . [name] (right bundle branch block)     Past Surgical History:  Procedure Laterality Date  . CARDIAC CATHETERIZATION N/A 02/09/2016   Procedure: Left Heart Cath and Coronary Angiography;  Surgeon: Corky Crafts, MD;  Location: [facility];  Service: Cardiovascular;  Laterality: N/A;  . CARDIOVASCULAR STRESS TEST  08/20/2002   EF 76%  . CARPAL TUNNEL RELEASE    . CHOLECYSTECTOMY  1987  . DILATION AND CURETTAGE OF UTERUS  multiple  . HAND SURGERY  1987 and 1988   bilateral  . SPINE SURGERY  08/2008   T2  . TUBAL LIGATION  1975  . US ECHOCARDIOGRAPHY  07/25/2004   EF 55-60%    History  Smoking Status  . Never Smoker  Smokeless Tobacco  . Never Used    History  Alcohol Use No    Family History  Problem Relation Age of Onset  . Heart failure Mother   . Rheum arthritis Mother   . Hypertension Father   . Stroke Father   . Diabetes Father   . Rheum arthritis Father     Reviw of Systems:  Reviewed in the HPI.  All other systems are negative.  Physical Exam: Blood pressure 120/80, pulse 62, height 5\' 10"  (1.778 m), weight 211 lb (95.7 kg). General: Well developed, well nourished, in no acute distress.  Head: Normocephalic, atraumatic, sclera non-icteric, mucus membranes are moist,   Neck: Supple. Carotids are 2 + without bruits. No JVD  Lungs: Clear bilaterally to auscultation.  Heart: regular rate.  normal  S1 S2. No murmurs, gallops or rubs.  Abdomen: Soft, non-tender, non-distended with normal bowel sounds. No hepatomegaly. No rebound/guarding. No masses.  Msk:  Strength and tone are normal  Extremities: No clubbing or cyanosis. No edema.  Distal pedal pulses are 2+ and equal bilaterally.  Neuro: Alert and oriented X 3. Moves all extremities spontaneously.  Psych:  Responds  to questions appropriately with a normal affect.  ECG: Jan. 24, 2018:   NSR at 62.   [name] . NS T wave abn.   Assessment / Plan:   1.   PAF -  the patient was admitted to [facility] with CP - had a cath and was DC'd.   Was admited  [facility] the next day with persistent CP (  Sept. 2017)  with acute pericarditis and paroxysmal atrial fibrillation. She was seen by Dr. Vilinda Boehringer.  She converted on diltiazem and flecainide.   We'll continue the current dose of flecainide and diltiazem.  She'll tentatively the having some neck surgery later this spring. We discussed the fact that she will hold her Xarelto for 3 days prior to the next surgery. She otherwise will be at low risk for any cardiac complications.  2. Actue pericarditis .  She presented to [facility] with acute chest pain in mid September. Her cardiac enzymes were negative. Cardiac catheterization revealed normal coronary arteries. She was later found to have acute pericarditis ( when she was readmitted to [facility] )  likely related to her rheumatoid arthritis. She was treated with colchicine. Her rheumatologist has increased her treatment of her rheumatoid arthritis and her symptoms seemed to be gradually improving. Will DC the colchicine today   2. Hypertension - her blood pressure is well-controlled. Continue current medications.  3. Hyperlipidemia her lipids have been well controlled. Will draw fasting lipids, liver enzymes, and basic medical profile today.  4. Mild obesity.  5. [name]:        [name], MD  06/19/2016 8:23 AM    [facility] [address] [place],  [address] [place], [place]  [address] Pager [phone] Phone: [phone]; Fax: [phone]

## 2016-06-19 NOTE — Patient Instructions (Signed)
Medication Instructions:  CHANGE Colchicine to 0.6 mg as needed for gout   Labwork: None Ordered   Testing/Procedures: None Ordered   Follow-Up: Your physician wants you to follow-up in: 6 months with Dr. Elease Hashimoto.  You will receive a reminder letter in the mail two months in advance. If you don't receive a letter, please call our office to schedule the follow-up appointment.   If you need a refill on your cardiac medications before your next appointment, please call your pharmacy.   Thank you for choosing [facility]! Eligha Bridegroom, RN [phone]

## 2016-07-05 ENCOUNTER — Other Ambulatory Visit: Payer: Self-pay | Admitting: Neurological Surgery

## 2016-07-29 NOTE — Pre-Procedure Instructions (Addendum)
    Sydney Wolfe  07/29/2016      [id number] - [place], [place] - [address] AT CORNER OF [address]  [place] [address] Phone: [phone] Fax: [phone]  [id number] - [place], [place] - [address] AT [facility] [address] & [facility] [facility] [address] [place] [address] Phone: [phone] Fax: [phone]    Your procedure is scheduled on Tuesday, March 13th   Report to [facility] Admitting at 5:30 AM.             (posted surgery time 7:30 - 11:16 am)   Call this number if you have problems the [facility] of surgery:  [phone], or [phone] Mon-Fri from 8-4:30 pm.   Remember:  Do not eat food or drink liquids after midnight Monday.   Take these medicines the morning of surgery with A SIP OF WATER : Levothyroxine, Gabapentin, flecainide, diltiazem, colchicine    (STOP XARELTO 3 DAYS PRIOR TO SURGERY)                    4-5 days prior to surgery, STOP taking any Vitamins, Herbal Supplements, Anti-inflammatories.   Do not wear jewelry, make-up or nail polish.  Do not wear lotions, powders,  perfumes, or deoderant.  Do not shave 48 hours prior to surgery.     Do not bring valuables to the hospital.  [facility] is not responsible for any belongings or valuables.  Contacts, dentures or bridgework may not be worn into surgery.  Leave your suitcase in the car.  After surgery it may be brought to your room.  For patients admitted to the hospital, discharge time will be determined by your treatment team.  Please read over the following fact sheets that you were given. Pain Booklet, MRSA Information and Surgical Site Infection Prevention

## 2016-07-30 ENCOUNTER — Encounter (HOSPITAL_COMMUNITY)
Admission: RE | Admit: 2016-07-30 | Discharge: 2016-07-30 | Disposition: A | Discharge status: Home / Self Care | Payer: Medicare Other | Source: Ambulatory Visit | Attending: Neurological Surgery | Admitting: Neurological Surgery

## 2016-07-30 ENCOUNTER — Encounter (HOSPITAL_COMMUNITY): Payer: Self-pay

## 2016-07-30 DIAGNOSIS — M4722 Other spondylosis with radiculopathy, cervical region: Secondary | ICD-10-CM | POA: Diagnosis not present

## 2016-07-30 DIAGNOSIS — Z01812 Encounter for preprocedural laboratory examination: Secondary | ICD-10-CM | POA: Insufficient documentation

## 2016-07-30 DIAGNOSIS — M4712 Other spondylosis with myelopathy, cervical region: Secondary | ICD-10-CM | POA: Insufficient documentation

## 2016-07-30 HISTORY — DX: Paroxysmal atrial fibrillation: I48.0

## 2016-07-30 HISTORY — DX: Dyspnea, unspecified: R06.00

## 2016-07-30 HISTORY — DX: Disease of pericardium, unspecified: I31.9

## 2016-07-30 LAB — SURGICAL PCR SCREEN
MRSA, PCR: NEGATIVE
STAPHYLOCOCCUS AUREUS: NEGATIVE

## 2016-07-30 LAB — BASIC METABOLIC PANEL
Anion gap: 9 (ref 5–15)
BUN: 13 mg/dL (ref 6–20)
CALCIUM: 9.4 mg/dL (ref 8.9–10.3)
CO2: 24 mmol/L (ref 22–32)
CREATININE: 0.74 mg/dL (ref 0.44–1.00)
Chloride: 107 mmol/L (ref 101–111)
GFR calc non Af Amer: >60 mL/min (ref >60)
GLUCOSE: 107 mg/dL — AB (ref 65–99)
Potassium: 4.4 mmol/L (ref 3.5–5.1)
Sodium: 140 mmol/L (ref 135–145)

## 2016-07-30 LAB — TYPE AND SCREEN
ABO/RH(D): O POS
ANTIBODY SCREEN: NEGATIVE

## 2016-07-30 LAB — CBC
HCT: 43.8 % (ref 36.0–46.0)
Hemoglobin: 14.6 g/dL (ref 12.0–15.0)
MCH: 32.4 pg (ref 26.0–34.0)
MCHC: 33.3 g/dL (ref 30.0–36.0)
MCV: 97.3 fL (ref 78.0–100.0)
PLATELETS: 175 10*3/uL (ref 150–400)
RBC: 4.5 MIL/uL (ref 3.87–5.11)
RDW: 13.4 % (ref 11.5–15.5)
WBC: 7.6 10*3/uL (ref 4.0–10.5)

## 2016-07-30 NOTE — Progress Notes (Signed)
PATIENT WAS INSTRUCTED BY HER CARDIOLOGIST TO STOP XARELTO 3 DAYS PRIOR TO SURGERY.

## 2016-07-31 ENCOUNTER — Encounter (HOSPITAL_COMMUNITY): Payer: Self-pay

## 2016-07-31 LAB — ABO/RH: ABO/RH(D): O POS

## 2016-07-31 NOTE — Progress Notes (Signed)
Anesthesia Chart Review:  Pt is a 66 year old female scheduled for C4-7 ACDF and plate fixation on 08/06/2016 with Cherrie Distance, MD.   - Cardiologist is [name], MD who cleared pt for surgery at last office visit 06/19/16 - Pulmonologist is Sandrea Hughs, MD, last office visit 04/04/16 - Rheumatologist is Zenovia Jordan, MD - PCP is Antony Haste, MD (notes in care everywhere)  PMH includes:  HTN, PAF, [name], hyperlipidemia, hypothyroidism, Horner's syndrome, RA, pericarditis (01/2016), dyspnea (pulmonology thinks is related to obesity and deconditioning). Never smoker. BMI 31.5  Medications include: Abatacept, diltiazem, flecainide, levothyroxine, prednisone, xarelto, crestor. Pt to stop xarelto 3 days before surgery.   Preoperative labs reviewed.  PT/INR will be obtained DOS.   CXR 02/15/16: Mild residual atelectasis.  EKG 06/19/16: NSR. [name]. T wave abnormality, consider inferior ischemia.   PFTs 03/27/16: FVC  2.40 (62%)  s obst/ DLCO 53% corrects to 82% and ERV 20% c/w effects of obesity   Cardiac cath 02/09/16:   The left ventricular systolic function is normal.  The left ventricular ejection fraction is 55-65% by visual estimate.  There is no aortic valve stenosis.  LV end diastolic pressure is normal.  No angiographically apparent coronary artery disease.  No evidence of thoracic aortic dissection.  Echo 12/28/15:  - Left ventricle: The cavity size was normal. Wall thickness was increased in a pattern of mild LVH. Systolic function was normal. The estimated ejection fraction was in the range of 60% to 65%. Wall motion was normal; there were no regional wall motion abnormalities. Doppler parameters are consistent with abnormal left ventricular relaxation (grade 1 diastolic dysfunction). - Aortic valve: There was trivial regurgitation. - Aorta: Ascending aortic diameter: 40 mm (S). - Ascending aorta: The ascending aorta was mildly dilated. - Mitral valve: There was mild  regurgitation. - Right ventricle: The cavity size was normal. Systolic function was normal. - Tricuspid valve: Peak RV-RA gradient (S): 24 mm Hg. - Pulmonary arteries: PA peak pressure: 27 mm Hg (S). - Inferior vena cava: The vessel was normal in size. The respirophasic diameter changes were in the normal range (>= 50%), consistent with normal central venous pressure.  If no changes, I anticipate pt can proceed with surgery as scheduled.   Rica Mast, FNP-BC [facility]iology Phone: [phone] 07/31/2016 3:19 PM

## 2016-08-05 NOTE — Anesthesia Preprocedure Evaluation (Addendum)
Anesthesia Evaluation    Reviewed: Allergy & Precautions, Patient's Chart, lab work & pertinent test results  Airway Mallampati: I       Dental  (+) Teeth Intact   Pulmonary neg pulmonary ROS, shortness of breath,    breath sounds clear to auscultation       Cardiovascular hypertension, + angina + Peripheral Vascular Disease and +CHF (history)  negative cardio ROS  + dysrhythmias + Valvular Problems/Murmurs  Rhythm:Regular     Neuro/Psych negative neurological ROS     GI/Hepatic negative GI ROS, Neg liver ROS, GERD  ,  Endo/Other  negative endocrine ROSHypothyroidism   Renal/GU negative Renal ROS     Musculoskeletal negative musculoskeletal ROS (+) Arthritis ,   Abdominal   Peds  Hematology negative hematology ROS (+)   Anesthesia Other Findings Day of surgery medications reviewed with the patient.  Reproductive/Obstetrics                           Anesthesia Physical Anesthesia Plan  ASA: III  Anesthesia Plan: General   Post-op Pain Management:    Induction: Intravenous  Airway Management Planned: Oral ETT  Additional Equipment:   Intra-op Plan:   Post-operative Plan: Extubation in OR  Informed Consent: I have reviewed the patients History and Physical, chart, labs and discussed the procedure including the risks, benefits and alternatives for the proposed anesthesia with the patient or authorized representative who has indicated his/her understanding and acceptance.   Dental advisory given  Plan Discussed with: CRNA  Anesthesia Plan Comments: (See Kobb NP Anesthesia Notes Patient stopped xarelto for 3 days)       Anesthesia Quick Evaluation

## 2016-08-06 ENCOUNTER — Encounter (HOSPITAL_COMMUNITY): Payer: Self-pay | Admitting: *Deleted

## 2016-08-06 ENCOUNTER — Inpatient Hospital Stay (HOSPITAL_COMMUNITY): Payer: Medicare Other | Admitting: Emergency Medicine

## 2016-08-06 ENCOUNTER — Inpatient Hospital Stay (HOSPITAL_COMMUNITY)
Admission: RE | Admit: 2016-08-06 | Discharge: 2016-08-07 | DRG: 473 | Disposition: A | Discharge status: Home / Self Care | Payer: Medicare Other | Source: Ambulatory Visit | Attending: Neurological Surgery | Admitting: Neurological Surgery

## 2016-08-06 ENCOUNTER — Inpatient Hospital Stay (HOSPITAL_COMMUNITY): Payer: Medicare Other | Admitting: Anesthesiology

## 2016-08-06 ENCOUNTER — Inpatient Hospital Stay (HOSPITAL_COMMUNITY): Payer: Medicare Other

## 2016-08-06 ENCOUNTER — Encounter (HOSPITAL_COMMUNITY)
Admission: RE | Disposition: A | Discharge status: Home / Self Care | Payer: Self-pay | Source: Ambulatory Visit | Attending: Neurological Surgery

## 2016-08-06 DIAGNOSIS — Z419 Encounter for procedure for purposes other than remedying health state, unspecified: Secondary | ICD-10-CM

## 2016-08-06 DIAGNOSIS — Z79899 Other long term (current) drug therapy: Secondary | ICD-10-CM

## 2016-08-06 DIAGNOSIS — I480 Paroxysmal atrial fibrillation: Secondary | ICD-10-CM | POA: Diagnosis present

## 2016-08-06 DIAGNOSIS — Z791 Long term (current) use of non-steroidal anti-inflammatories (NSAID): Secondary | ICD-10-CM | POA: Diagnosis not present

## 2016-08-06 DIAGNOSIS — E669 Obesity, unspecified: Secondary | ICD-10-CM | POA: Diagnosis present

## 2016-08-06 DIAGNOSIS — M542 Cervicalgia: Secondary | ICD-10-CM | POA: Diagnosis present

## 2016-08-06 DIAGNOSIS — Z7901 Long term (current) use of anticoagulants: Secondary | ICD-10-CM | POA: Diagnosis not present

## 2016-08-06 DIAGNOSIS — E039 Hypothyroidism, unspecified: Secondary | ICD-10-CM | POA: Diagnosis present

## 2016-08-06 DIAGNOSIS — M4712 Other spondylosis with myelopathy, cervical region: Principal | ICD-10-CM | POA: Diagnosis present

## 2016-08-06 DIAGNOSIS — M4722 Other spondylosis with radiculopathy, cervical region: Secondary | ICD-10-CM | POA: Diagnosis present

## 2016-08-06 DIAGNOSIS — Z7952 Long term (current) use of systemic steroids: Secondary | ICD-10-CM

## 2016-08-06 DIAGNOSIS — Z6831 Body mass index (BMI) 31.0-31.9, adult: Secondary | ICD-10-CM

## 2016-08-06 DIAGNOSIS — E785 Hyperlipidemia, unspecified: Secondary | ICD-10-CM | POA: Diagnosis present

## 2016-08-06 DIAGNOSIS — I10 Essential (primary) hypertension: Secondary | ICD-10-CM | POA: Diagnosis present

## 2016-08-06 HISTORY — PX: ANTERIOR CERVICAL DECOMP/DISCECTOMY FUSION: SHX1161

## 2016-08-06 LAB — PROTIME-INR
INR: 0.9
Prothrombin Time: 12.2 seconds (ref 11.4–15.2)

## 2016-08-06 SURGERY — ANTERIOR CERVICAL DECOMPRESSION/DISCECTOMY FUSION 3 LEVELS
Anesthesia: General | Site: Neck

## 2016-08-06 MED ORDER — ARTIFICIAL TEARS OP OINT
TOPICAL_OINTMENT | OPHTHALMIC | Status: AC
  Filled 2016-08-06: qty 3.5

## 2016-08-06 MED ORDER — SODIUM CHLORIDE 0.9% FLUSH: 3.0000 mL | INTRAVENOUS | Status: DC | PRN

## 2016-08-06 MED ORDER — ONDANSETRON HCL 4 MG/2ML IJ SOLN: 4.0000 mg | Freq: Four times a day (QID) | INTRAMUSCULAR | Status: DC | PRN

## 2016-08-06 MED ORDER — SODIUM CHLORIDE 0.9 % IV SOLN: 250.0000 mL | INTRAVENOUS | Status: DC

## 2016-08-06 MED ORDER — THROMBIN 5000 UNITS EX SOLR
CUTANEOUS | Status: DC | PRN
  Administered 2016-08-06 (×2): 5 mL via TOPICAL

## 2016-08-06 MED ORDER — PHENOL 1.4 % MT LIQD
1.0000 | OROMUCOSAL | Status: DC | PRN
Start: 2016-08-06 — End: 2016-08-07

## 2016-08-06 MED ORDER — ARTIFICIAL TEARS OP OINT
TOPICAL_OINTMENT | OPHTHALMIC | Status: DC | PRN
  Administered 2016-08-06: 1 via OPHTHALMIC

## 2016-08-06 MED ORDER — SUGAMMADEX SODIUM 200 MG/2ML IV SOLN
INTRAVENOUS | Status: AC
  Filled 2016-08-06: qty 2

## 2016-08-06 MED ORDER — MIDAZOLAM HCL 5 MG/5ML IJ SOLN
INTRAMUSCULAR | Status: DC | PRN
  Administered 2016-08-06: 2 mg via INTRAVENOUS

## 2016-08-06 MED ORDER — 0.9 % SODIUM CHLORIDE (POUR BTL) OPTIME
TOPICAL | Status: DC | PRN
  Administered 2016-08-06: 1000 mL

## 2016-08-06 MED ORDER — CYANOCOBALAMIN 500 MCG PO TABS
500.0000 ug | ORAL_TABLET | Freq: Every day | ORAL | Status: DC
  Administered 2016-08-07: 500 ug via ORAL
  Filled 2016-08-06: qty 1

## 2016-08-06 MED ORDER — TIMOLOL MALEATE 0.5 % OP SOLN
1.0000 [drp] | Freq: Two times a day (BID) | OPHTHALMIC | Status: DC
  Administered 2016-08-06 – 2016-08-07 (×2): 1 [drp] via OPHTHALMIC
  Filled 2016-08-06: qty 5

## 2016-08-06 MED ORDER — ACETAMINOPHEN ER 650 MG PO TBCR: 1300.0000 mg | EXTENDED_RELEASE_TABLET | Freq: Two times a day (BID) | ORAL | Status: DC

## 2016-08-06 MED ORDER — LIDOCAINE 2% (20 MG/ML) 5 ML SYRINGE
INTRAMUSCULAR | Status: AC
  Filled 2016-08-06: qty 5

## 2016-08-06 MED ORDER — EPHEDRINE SULFATE 50 MG/ML IJ SOLN
INTRAMUSCULAR | Status: DC | PRN
  Administered 2016-08-06: 10 mg via INTRAVENOUS
  Administered 2016-08-06: 5 mg via INTRAVENOUS
  Administered 2016-08-06 (×2): 10 mg via INTRAVENOUS
  Administered 2016-08-06 (×3): 5 mg via INTRAVENOUS

## 2016-08-06 MED ORDER — ZOLPIDEM TARTRATE 5 MG PO TABS: 5.0000 mg | ORAL_TABLET | Freq: Every evening | ORAL | Status: DC | PRN

## 2016-08-06 MED ORDER — COLCHICINE 0.6 MG PO TABS: 0.6000 mg | ORAL_TABLET | Freq: Two times a day (BID) | ORAL | Status: DC | PRN

## 2016-08-06 MED ORDER — PROPOFOL 10 MG/ML IV BOLUS
INTRAVENOUS | Status: DC | PRN
  Administered 2016-08-06: 20 mg via INTRAVENOUS
  Administered 2016-08-06: 150 mg via INTRAVENOUS

## 2016-08-06 MED ORDER — PHENYLEPHRINE 40 MCG/ML (10ML) SYRINGE FOR IV PUSH (FOR BLOOD PRESSURE SUPPORT)
PREFILLED_SYRINGE | INTRAVENOUS | Status: AC
  Filled 2016-08-06: qty 10

## 2016-08-06 MED ORDER — ROSUVASTATIN CALCIUM 5 MG PO TABS
5.0000 mg | ORAL_TABLET | ORAL | Status: DC
  Administered 2016-08-06: 5 mg via ORAL
  Filled 2016-08-06: qty 1

## 2016-08-06 MED ORDER — SODIUM CHLORIDE 0.9% FLUSH
3.0000 mL | Freq: Two times a day (BID) | INTRAVENOUS | Status: DC
  Administered 2016-08-06: 3 mL via INTRAVENOUS

## 2016-08-06 MED ORDER — ONDANSETRON HCL 4 MG PO TABS: 4.0000 mg | ORAL_TABLET | Freq: Four times a day (QID) | ORAL | Status: DC | PRN

## 2016-08-06 MED ORDER — ONDANSETRON HCL 4 MG/2ML IJ SOLN: 4.0000 mg | Freq: Once | INTRAMUSCULAR | Status: DC | PRN

## 2016-08-06 MED ORDER — FENTANYL CITRATE (PF) 100 MCG/2ML IJ SOLN
INTRAMUSCULAR | Status: AC
  Filled 2016-08-06: qty 4

## 2016-08-06 MED ORDER — MEPERIDINE HCL 25 MG/ML IJ SOLN: 6.2500 mg | INTRAMUSCULAR | Status: DC | PRN

## 2016-08-06 MED ORDER — LIDOCAINE-EPINEPHRINE 2 %-1:100000 IJ SOLN
INTRAMUSCULAR | Status: AC
  Filled 2016-08-06: qty 1

## 2016-08-06 MED ORDER — SUGAMMADEX SODIUM 200 MG/2ML IV SOLN
INTRAVENOUS | Status: DC | PRN
  Administered 2016-08-06: 200 mg via INTRAVENOUS

## 2016-08-06 MED ORDER — EPHEDRINE 5 MG/ML INJ
INTRAVENOUS | Status: AC
  Filled 2016-08-06: qty 10

## 2016-08-06 MED ORDER — ACETAMINOPHEN 500 MG PO TABS
1000.0000 mg | ORAL_TABLET | Freq: Four times a day (QID) | ORAL | Status: DC
  Administered 2016-08-06 – 2016-08-07 (×3): 1000 mg via ORAL
  Filled 2016-08-06 (×3): qty 2

## 2016-08-06 MED ORDER — MIDAZOLAM HCL 2 MG/2ML IJ SOLN
INTRAMUSCULAR | Status: AC
  Filled 2016-08-06: qty 2

## 2016-08-06 MED ORDER — CHLORHEXIDINE GLUCONATE CLOTH 2 % EX PADS: 6.0000 | MEDICATED_PAD | Freq: Once | CUTANEOUS | Status: DC

## 2016-08-06 MED ORDER — DIAZEPAM 5 MG PO TABS
5.0000 mg | ORAL_TABLET | Freq: Four times a day (QID) | ORAL | Status: DC | PRN
  Administered 2016-08-06 (×2): 5 mg via ORAL
  Filled 2016-08-06 (×2): qty 1

## 2016-08-06 MED ORDER — FENTANYL CITRATE (PF) 100 MCG/2ML IJ SOLN
25.0000 ug | INTRAMUSCULAR | Status: DC | PRN
Start: 2016-08-06 — End: 2016-08-06
  Administered 2016-08-06: 50 ug via INTRAVENOUS

## 2016-08-06 MED ORDER — CEFAZOLIN SODIUM-DEXTROSE 2-4 GM/100ML-% IV SOLN
2.0000 g | INTRAVENOUS | Status: AC
  Administered 2016-08-06: 2 g via INTRAVENOUS
  Filled 2016-08-06: qty 100

## 2016-08-06 MED ORDER — ROCURONIUM BROMIDE 100 MG/10ML IV SOLN
INTRAVENOUS | Status: DC | PRN
  Administered 2016-08-06: 10 mg via INTRAVENOUS
  Administered 2016-08-06: 40 mg via INTRAVENOUS
  Administered 2016-08-06 (×2): 10 mg via INTRAVENOUS

## 2016-08-06 MED ORDER — FLEET ENEMA 7-19 GM/118ML RE ENEM: 1.0000 | ENEMA | Freq: Once | RECTAL | Status: DC | PRN

## 2016-08-06 MED ORDER — FENTANYL CITRATE (PF) 100 MCG/2ML IJ SOLN
INTRAMUSCULAR | Status: DC | PRN
  Administered 2016-08-06 (×2): 25 ug via INTRAVENOUS
  Administered 2016-08-06: 50 ug via INTRAVENOUS
  Administered 2016-08-06: 25 ug via INTRAVENOUS
  Administered 2016-08-06: 75 ug via INTRAVENOUS

## 2016-08-06 MED ORDER — FLECAINIDE ACETATE 100 MG PO TABS
100.0000 mg | ORAL_TABLET | Freq: Two times a day (BID) | ORAL | Status: DC
  Administered 2016-08-06 – 2016-08-07 (×2): 100 mg via ORAL
  Filled 2016-08-06 (×2): qty 1

## 2016-08-06 MED ORDER — VITAMIN D 1000 UNITS PO TABS
2000.0000 [IU] | ORAL_TABLET | Freq: Every day | ORAL | Status: DC
  Administered 2016-08-07: 2000 [IU] via ORAL
  Filled 2016-08-06: qty 2

## 2016-08-06 MED ORDER — MENTHOL 3 MG MT LOZG: 1.0000 | LOZENGE | OROMUCOSAL | Status: DC | PRN

## 2016-08-06 MED ORDER — CELECOXIB 200 MG PO CAPS
200.0000 mg | ORAL_CAPSULE | Freq: Two times a day (BID) | ORAL | Status: DC
  Administered 2016-08-06 – 2016-08-07 (×2): 200 mg via ORAL
  Filled 2016-08-06 (×2): qty 1

## 2016-08-06 MED ORDER — GABAPENTIN 300 MG PO CAPS
600.0000 mg | ORAL_CAPSULE | Freq: Three times a day (TID) | ORAL | Status: DC
  Administered 2016-08-06 – 2016-08-07 (×2): 600 mg via ORAL
  Filled 2016-08-06 (×3): qty 2

## 2016-08-06 MED ORDER — SENNA 8.6 MG PO TABS
1.0000 | ORAL_TABLET | Freq: Two times a day (BID) | ORAL | Status: DC
  Administered 2016-08-06 – 2016-08-07 (×2): 8.6 mg via ORAL
  Filled 2016-08-06 (×2): qty 1

## 2016-08-06 MED ORDER — NITROGLYCERIN 0.4 MG SL SUBL: 0.4000 mg | SUBLINGUAL_TABLET | SUBLINGUAL | Status: DC | PRN

## 2016-08-06 MED ORDER — BISACODYL 10 MG RE SUPP: 10.0000 mg | Freq: Every day | RECTAL | Status: DC | PRN

## 2016-08-06 MED ORDER — SODIUM CHLORIDE 0.9 % IR SOLN
Status: DC | PRN
  Administered 2016-08-06: 500 mL

## 2016-08-06 MED ORDER — DOCUSATE SODIUM 100 MG PO CAPS
100.0000 mg | ORAL_CAPSULE | Freq: Two times a day (BID) | ORAL | Status: DC
  Administered 2016-08-06 – 2016-08-07 (×2): 100 mg via ORAL
  Filled 2016-08-06 (×2): qty 1

## 2016-08-06 MED ORDER — DEXAMETHASONE SODIUM PHOSPHATE 4 MG/ML IJ SOLN
2.0000 mg | Freq: Three times a day (TID) | INTRAMUSCULAR | Status: AC
  Administered 2016-08-06 (×2): 2 mg via INTRAVENOUS
  Filled 2016-08-06 (×2): qty 1

## 2016-08-06 MED ORDER — LIDOCAINE-EPINEPHRINE 2 %-1:100000 IJ SOLN
INTRAMUSCULAR | Status: DC | PRN
  Administered 2016-08-06: 5 mL

## 2016-08-06 MED ORDER — BUPIVACAINE-EPINEPHRINE (PF) 0.5% -1:200000 IJ SOLN
INTRAMUSCULAR | Status: AC
  Filled 2016-08-06: qty 30

## 2016-08-06 MED ORDER — CEFAZOLIN SODIUM-DEXTROSE 2-4 GM/100ML-% IV SOLN
INTRAVENOUS | Status: AC
  Filled 2016-08-06: qty 100

## 2016-08-06 MED ORDER — PROPOFOL 10 MG/ML IV BOLUS
INTRAVENOUS | Status: AC
  Filled 2016-08-06: qty 40

## 2016-08-06 MED ORDER — LACTATED RINGERS IV SOLN
INTRAVENOUS | Status: DC | PRN
  Administered 2016-08-06: 07:00:00 via INTRAVENOUS

## 2016-08-06 MED ORDER — PANTOPRAZOLE SODIUM 40 MG IV SOLR
40.0000 mg | Freq: Every day | INTRAVENOUS | Status: DC
  Administered 2016-08-06: 40 mg via INTRAVENOUS
  Filled 2016-08-06: qty 40

## 2016-08-06 MED ORDER — ONDANSETRON HCL 4 MG/2ML IJ SOLN
INTRAMUSCULAR | Status: AC
  Filled 2016-08-06: qty 2

## 2016-08-06 MED ORDER — LEVOTHYROXINE SODIUM 100 MCG PO TABS
50.0000 ug | ORAL_TABLET | Freq: Every day | ORAL | Status: DC
  Administered 2016-08-07: 50 ug via ORAL
  Filled 2016-08-06: qty 1

## 2016-08-06 MED ORDER — PREDNISONE 5 MG PO TABS
5.0000 mg | ORAL_TABLET | Freq: Every day | ORAL | Status: DC
  Filled 2016-08-06: qty 1

## 2016-08-06 MED ORDER — ONDANSETRON HCL 4 MG/2ML IJ SOLN
INTRAMUSCULAR | Status: DC | PRN
  Administered 2016-08-06: 4 mg via INTRAVENOUS

## 2016-08-06 MED ORDER — OXYCODONE HCL 5 MG PO TABS
5.0000 mg | ORAL_TABLET | ORAL | Status: DC | PRN
  Administered 2016-08-06: 5 mg via ORAL
  Administered 2016-08-06 – 2016-08-07 (×3): 10 mg via ORAL
  Filled 2016-08-06 (×2): qty 2
  Filled 2016-08-06: qty 1
  Filled 2016-08-06 (×2): qty 2

## 2016-08-06 MED ORDER — DEXAMETHASONE SODIUM PHOSPHATE 10 MG/ML IJ SOLN
INTRAMUSCULAR | Status: AC
  Filled 2016-08-06: qty 1

## 2016-08-06 MED ORDER — LIDOCAINE HCL (CARDIAC) 20 MG/ML IV SOLN
INTRAVENOUS | Status: DC | PRN
  Administered 2016-08-06: 100 mg via INTRAVENOUS

## 2016-08-06 MED ORDER — ROCURONIUM BROMIDE 50 MG/5ML IV SOSY
PREFILLED_SYRINGE | INTRAVENOUS | Status: AC
  Filled 2016-08-06: qty 10

## 2016-08-06 MED ORDER — PHENYLEPHRINE HCL 10 MG/ML IJ SOLN
INTRAMUSCULAR | Status: DC | PRN
  Administered 2016-08-06 (×2): 80 ug via INTRAVENOUS

## 2016-08-06 MED ORDER — SODIUM CHLORIDE 0.9 % IV SOLN: INTRAVENOUS | Status: DC

## 2016-08-06 MED ORDER — THROMBIN 5000 UNITS EX SOLR
CUTANEOUS | Status: AC
  Filled 2016-08-06: qty 15000

## 2016-08-06 MED ORDER — DILTIAZEM HCL ER COATED BEADS 180 MG PO CP24
180.0000 mg | ORAL_CAPSULE | Freq: Every day | ORAL | Status: DC
  Administered 2016-08-07: 180 mg via ORAL
  Filled 2016-08-06: qty 1

## 2016-08-06 MED ORDER — DEXAMETHASONE SODIUM PHOSPHATE 10 MG/ML IJ SOLN
INTRAMUSCULAR | Status: DC | PRN
  Administered 2016-08-06: 10 mg via INTRAVENOUS

## 2016-08-06 MED ORDER — BUPIVACAINE-EPINEPHRINE (PF) 0.5% -1:200000 IJ SOLN
INTRAMUSCULAR | Status: DC | PRN
  Administered 2016-08-06: 5 mL via PERINEURAL

## 2016-08-06 MED ORDER — FENTANYL CITRATE (PF) 100 MCG/2ML IJ SOLN
INTRAMUSCULAR | Status: AC
  Administered 2016-08-06: 50 ug via INTRAVENOUS
  Filled 2016-08-06: qty 2

## 2016-08-06 MED ORDER — CEFAZOLIN IN D5W 1 GM/50ML IV SOLN
1.0000 g | Freq: Three times a day (TID) | INTRAVENOUS | Status: AC
  Administered 2016-08-06 (×2): 1 g via INTRAVENOUS
  Filled 2016-08-06 (×2): qty 50

## 2016-08-06 SURGICAL SUPPLY — 76 items
ADH SKN CLS APL DERMABOND .7 (GAUZE/BANDAGES/DRESSINGS) ×2
BIT DRILL INVIZIA (BIT) IMPLANT
BLADE ULTRA TIP 2M (BLADE) IMPLANT
BONE EQUIVA 5CC (Bone Implant) ×1 IMPLANT
BUR MATCHSTICK NEURO 3.0 LAGG (BURR) ×2 IMPLANT
CANISTER SUCT 3000ML PPV (MISCELLANEOUS) ×2 IMPLANT
CARTRIDGE OIL MAESTRO DRILL (MISCELLANEOUS) ×1 IMPLANT
CHLORAPREP W/TINT 26ML (MISCELLANEOUS) ×2 IMPLANT
COVER BACK TABLE 60X90IN (DRAPES) ×1 IMPLANT
DECANTER SPIKE VIAL GLASS SM (MISCELLANEOUS) ×2 IMPLANT
DERMABOND ADVANCED (GAUZE/BANDAGES/DRESSINGS) ×2
DERMABOND ADVANCED .7 DNX12 (GAUZE/BANDAGES/DRESSINGS) ×1 IMPLANT
DIFFUSER DRILL AIR PNEUMATIC (MISCELLANEOUS) ×2 IMPLANT
DRAPE C-ARM 42X72 X-RAY (DRAPES) ×4 IMPLANT
DRAPE HALF SHEET 40X57 (DRAPES) IMPLANT
DRAPE LAPAROTOMY 100X72 PEDS (DRAPES) ×2 IMPLANT
DRAPE MICROSCOPE LEICA (MISCELLANEOUS) ×1 IMPLANT
DRAPE POUCH INSTRU U-SHP 10X18 (DRAPES) ×2 IMPLANT
DRAPE SHEET LG 3/4 BI-LAMINATE (DRAPES) ×2 IMPLANT
DRILL BIT INVIZIA (BIT) ×2
DRSG OPSITE POSTOP 4X6 (GAUZE/BANDAGES/DRESSINGS) ×1 IMPLANT
ELECT COATED BLADE 2.86 ST (ELECTRODE) ×2 IMPLANT
ELECT REM PT RETURN 9FT ADLT (ELECTROSURGICAL) ×2
ELECTRODE REM PT RTRN 9FT ADLT (ELECTROSURGICAL) ×1 IMPLANT
EVACUATOR 1/8 PVC DRAIN (DRAIN) ×1 IMPLANT
GAUZE SPONGE 4X4 12PLY STRL (GAUZE/BANDAGES/DRESSINGS) IMPLANT
GAUZE SPONGE 4X4 16PLY XRAY LF (GAUZE/BANDAGES/DRESSINGS) IMPLANT
GLOVE BIOGEL PI IND STRL 7.0 (GLOVE) IMPLANT
GLOVE BIOGEL PI IND STRL 7.5 (GLOVE) ×1 IMPLANT
GLOVE BIOGEL PI IND STRL 8.5 (GLOVE) IMPLANT
GLOVE BIOGEL PI INDICATOR 7.0 (GLOVE) ×1
GLOVE BIOGEL PI INDICATOR 7.5 (GLOVE) ×1
GLOVE BIOGEL PI INDICATOR 8.5 (GLOVE) ×1
GLOVE ECLIPSE 8.0 STRL XLNG CF (GLOVE) ×1 IMPLANT
GLOVE EXAM NITRILE LRG STRL (GLOVE) IMPLANT
GLOVE SS BIOGEL STRL SZ 7 (GLOVE) IMPLANT
GLOVE SS BIOGEL STRL SZ 7.5 (GLOVE) ×2 IMPLANT
GLOVE SUPERSENSE BIOGEL SZ 7 (GLOVE) ×3
GLOVE SUPERSENSE BIOGEL SZ 7.5 (GLOVE) ×3
GOWN STRL REUS W/ TWL LRG LVL3 (GOWN DISPOSABLE) ×1 IMPLANT
GOWN STRL REUS W/ TWL XL LVL3 (GOWN DISPOSABLE) ×1 IMPLANT
GOWN STRL REUS W/TWL LRG LVL3 (GOWN DISPOSABLE) ×8
GOWN STRL REUS W/TWL XL LVL3 (GOWN DISPOSABLE) ×2
HEMOSTAT POWDER KIT SURGIFOAM (HEMOSTASIS) ×3 IMPLANT
INTERBODY TM 14X14X6-7DEG ANG (Metal Cage) ×3 IMPLANT
KIT BASIN OR (CUSTOM PROCEDURE TRAY) ×2 IMPLANT
KIT ROOM TURNOVER OR (KITS) ×2 IMPLANT
NDL HYPO 21X1.5 SAFETY (NEEDLE) ×1 IMPLANT
NDL SPNL 18GX3.5 QUINCKE PK (NEEDLE) ×1 IMPLANT
NEEDLE HYPO 21X1.5 SAFETY (NEEDLE) ×2 IMPLANT
NEEDLE SPNL 18GX3.5 QUINCKE PK (NEEDLE) ×2 IMPLANT
NS IRRIG 1000ML POUR BTL (IV SOLUTION) ×2 IMPLANT
OIL CARTRIDGE MAESTRO DRILL (MISCELLANEOUS) ×2
PACK LAMINECTOMY NEURO (CUSTOM PROCEDURE TRAY) ×2 IMPLANT
PAD ARMBOARD 7.5X6 YLW CONV (MISCELLANEOUS) ×3 IMPLANT
PATTIES SURGICAL .5X1.5 (GAUZE/BANDAGES/DRESSINGS) ×2 IMPLANT
PIN DISTRACTION 14MM (PIN) ×4 IMPLANT
PIN THREADED TEMP FIX INVIZIA (PIN) ×1 IMPLANT
PLATE INVIZIA 3 LEV 57MM (Plate) ×1 IMPLANT
RUBBERBAND STERILE (MISCELLANEOUS) ×2 IMPLANT
SCREW 16MM (Screw) ×6 IMPLANT
SCREW SPINAL 42.X16MM TITAN (Screw) ×2 IMPLANT
SPONGE INTESTINAL PEANUT (DISPOSABLE) ×4 IMPLANT
SPONGE SURGIFOAM ABS GEL SZ50 (HEMOSTASIS) IMPLANT
STAPLER VISISTAT 35W (STAPLE) ×2 IMPLANT
STOCKINETTE 6  STRL (DRAPES) ×1
STOCKINETTE 6 STRL (DRAPES) ×1 IMPLANT
SUT STRATAFIX MNCRL+ 3-0 PS-2 (SUTURE) ×1
SUT STRATAFIX MONOCRYL 3-0 (SUTURE) ×1
SUT VIC AB 3-0 SH 8-18 (SUTURE) ×3 IMPLANT
SUTURE STRATFX MNCRL+ 3-0 PS-2 (SUTURE) ×1 IMPLANT
TOWEL GREEN STERILE (TOWEL DISPOSABLE) ×2 IMPLANT
TOWEL GREEN STERILE FF (TOWEL DISPOSABLE) ×4 IMPLANT
TRAY FOLEY CATH 16FR SILVER (SET/KITS/TRAYS/PACK) ×2 IMPLANT
TUBE CONNECTING 12X1/4 (SUCTIONS) ×2 IMPLANT
WATER STERILE IRR 1000ML POUR (IV SOLUTION) ×2 IMPLANT

## 2016-08-06 NOTE — Transfer of Care (Signed)
Immediate Anesthesia Transfer of Care Note  Patient: Sydney Wolfe  Procedure(s) Performed: Procedure(s): Cervical four-Cervical seven  Anterior cervical discectomy with fusion and plate fixation (N/A)  Patient Location: PACU  Anesthesia Type:General  Level of Consciousness: awake, alert , oriented and sedated  Airway & Oxygen Therapy: Patient Spontanous Breathing and Patient connected to nasal cannula oxygen  Post-op Assessment: Report given to RN, Post -op Vital signs reviewed and stable and Patient moving all extremities  Post vital signs: Reviewed and stable  Last Vitals:  Vitals:   08/06/16 0554 08/06/16 1116  BP: 130/79   Pulse: 69   Resp: 20   Temp: 36.9 C (P) 37.2 C    Last Pain:  Vitals:   08/06/16 1116  TempSrc:   PainSc: (P) 0-No pain         Complications: No apparent anesthesia complications

## 2016-08-06 NOTE — H&P (Signed)
CC:  No chief complaint on file. Neck and arm pain  HPI: Sydney Wolfe is a 66 y.o. female with cervical myeloradiculopathy presents for elective C4-7 anterior discectomy with fusion and plate fixation.  No new issues.  PMH: Past Medical History:  Diagnosis Date  . Arthritis   . Dyspnea    with exertion    . Gout   . Heart murmur   . 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 Hot flashes   . Hyperlipidemia   . Hypertension   . Hypothyroidism   . Normal nuclear stress test 2004  . OA (osteoarthritis)   . Obesity   . PAF (paroxysmal atrial fibrillation) (HCC) 01/2016  . Pericarditis   . [name] (right bundle branch block)     PSH: Past Surgical History:  Procedure Laterality Date  . CARDIAC CATHETERIZATION N/A 02/09/2016   Procedure: Left Heart Cath and Coronary Angiography;  Surgeon: Corky Crafts, MD;  Location: [facility] INVASIVE CV LAB;  Service: Cardiovascular;  Laterality: N/A;  . CARDIOVASCULAR STRESS TEST  08/20/2002   EF 76%  . CARPAL TUNNEL RELEASE    . CHOLECYSTECTOMY  1987  . DILATION AND CURETTAGE OF UTERUS  multiple  . HAND SURGERY  1987 and 1988   bilateral  . SPINE SURGERY  08/2008   T2  . TUBAL LIGATION  1975  . US ECHOCARDIOGRAPHY  07/25/2004   EF 55-60%    SH: Social History  Substance Use Topics  . Smoking status: Never Smoker  . Smokeless tobacco: Never Used  . Alcohol use No    MEDS: Prior to Admission medications   Medication Sig Start Date End Date Taking? Authorizing Provider  Abatacept (ORENCIA IV) Inject into the vein as directed.   Yes Historical Provider, MD  acetaminophen (TYLENOL) 650 MG CR tablet Take 1,300 mg by mouth 2 (two) times daily.    Yes Historical Provider, MD  BETIMOL 0.5 % ophthalmic solution Place 1 drop into both eyes 2 (two) times daily. In the evening put drop in Left eye only. 11/02/14  Yes Historical Provider, MD  celecoxib (CELEBREX) 200 MG capsule Take 200 mg by mouth 2 (two) times  daily.  05/29/16  Yes Historical Provider, MD  Cholecalciferol (VITAMIN D) 2000 units tablet Take 2,000 Units by mouth daily.   Yes Historical Provider, MD  colchicine 0.6 MG tablet Take 1 tablet (0.6 mg total) by mouth 2 (two) times daily as needed (gout). 06/19/16  Yes Vesta Mixer, MD  cyanocobalamin 500 MCG tablet Take 500 mcg by mouth daily.   Yes Historical Provider, MD  diltiazem (CARTIA XT) 180 MG 24 hr capsule Take 1 capsule (180 mg total) by mouth daily. 06/19/16  Yes Vesta Mixer, MD  flecainide (TAMBOCOR) 100 MG tablet Take 1 tablet (100 mg total) by mouth 2 (two) times daily. 06/19/16  Yes Vesta Mixer, MD  gabapentin (NEURONTIN) 300 MG capsule Take 300 mg by mouth 3 (three) times daily. 05/29/16  Yes Historical Provider, MD  levothyroxine (SYNTHROID, LEVOTHROID) 50 MCG tablet Take 50 mcg by mouth daily before breakfast.  10/24/15  Yes Historical Provider, MD  nitroGLYCERIN (NITROSTAT) 0.4 MG SL tablet Place 1 tablet (0.4 mg total) under the tongue every 5 (five) minutes as needed for chest pain. 06/19/16  Yes Vesta Mixer, MD  rivaroxaban (XARELTO) 20 MG TABS tablet Take 1 tablet (20 mg total) by mouth daily with supper. 06/19/16  Yes Loistine Chance  Earnstine Regal, MD  rosuvastatin (CRESTOR) 5 MG tablet Take 1 tablet (5 mg total) by mouth every other day. 06/19/16  Yes Vesta Mixer, MD  predniSONE (DELTASONE) 5 MG tablet Take 5 mg by mouth daily with breakfast. Tapering dose    Historical Provider, MD    ALLERGY: Allergies  Allergen Reactions  . No Known Allergies     ROS: ROS  NEUROLOGIC EXAM: Awake, alert, oriented Memory and concentration grossly intact Speech fluent, appropriate CN grossly intact Motor exam: Upper Extremities Deltoid Bicep Tricep Grip  Right 5/5 5/5 5/5 5/5  Left 5/5 5/5 5/5 5/5   Lower Extremity IP Quad PF DF EHL  Right 5/5 5/5 5/5 5/5 5/5  Left 5/5 5/5 5/5 5/5 5/5   Sensation grossly intact to LT 3+ patellar reflexes  IMAGING: No new  imaging  IMPRESSION: - 66 y.o. female with cervical spondylosis with myelopathy and radiculopathy.  PLAN: - C4-7 ACDF - We have discussed the risks, benefits, and alternatives to surgery in clinic.  She has had the opportunity to ask questions today.  She wishes to proceed.

## 2016-08-06 NOTE — Op Note (Signed)
08/06/2016  10:53 AM  PATIENT:  Sydney Wolfe  66 y.o. female  PRE-OPERATIVE DIAGNOSIS:  Cervical spondylosis with myelopathy and radiculopathy  POST-OPERATIVE DIAGNOSIS:  Same  PROCEDURE:  C4-7 anterior discectomy with fusion and plate fixation  SURGEON:  Hulan Saas, MD  ASSISTANTS: Maeola Harman, MD; Cindra Presume, PA-C  ANESTHESIA:   General  DRAINS: Medium hemovac   SPECIMEN:  None  INDICATION FOR PROCEDURE: 66 year old woman with cervical myelopathy and radiculopathy. Patient understood the risks, benefits, and alternatives and potential outcomes and wished to proceed.  PROCEDURE DETAILS: Patient was brought to the operating room placed under general endotracheal anesthesia. Patient was placed in the supine position on the operating room table. The neck was prepped with betadine and chloraprep and draped in a sterile fashion.   A transverse incision was made on the right side of the neck. Dissection was carried down thru the subcutaneous tissue and the platysma was elevated, opened vertically, and undermined with Metzenbaum scissors. Dissection was then carried out thru an avascular plane leaving the sternocleidomastoid, carotid artery, and jugular vein laterally and the trachea and esophagus medially. The ventral aspect of the vertebral column was identified and a localizing x-ray was taken. The C4-5 level was identified. The longus colli muscles were then elevated and the retractor was placed. The annulus was incised and the disc space entered. Discectomy was performed with micro-curettes and pituitary and kerrison rongeurs. I then used the high-speed drill to drill the endplates down to the level of the posterior longitudinal ligament. The operating microscope was draped and brought into the field provided additional magnification, illumination and visualization. Utilizing microsurgical technique, discectomy was continued posteriorly thru the disc space. Posterior  longitudinal ligament was opened with a nerve hook, and then removed along with disc herniation and osteophytes, decompressing the spinal canal and thecal sac. We then continued to remove osteophytic overgrowth and disc material decompressing the neural foramina and exiting nerve roots bilaterally. So by both visualization and palpation we felt we had an adequate decompression of the neural elements. We then measured the height of the intravertebral disc space and selected a machined lordotic metal interbody cage packed with allograft and beta tricalcium phosphate. It was then gently positioned in the intravertebral disc space and countersunk.   The superior distraction pin was then removed and bone bleeding was stopped with bone wax. The distraction pin was inserted at the next inferior level. The annulus was incised and the disc space entered. Discectomy was performed with micro-curettes and pituitary and kerrison rongeurs. I then used the high-speed drill to drill the endplates down to the level of the posterior longitudinal ligament. The operating microscope was returned to the field.  The discectomy was continued posteriorly thru the disc space. Posterior longitudinal ligament was opened with a nerve hook, and then removed along with disc herniation and osteophytes, decompressing the spinal canal and thecal sac. We then continued to remove osteophytic overgrowth and disc material decompressing the neural foramina and exiting nerve roots bilaterally. So by both visualization and palpation we felt we had an adequate decompression of the neural elements. We then measured the height of the intravertebral disc space and selected a second machined lordotic metal interbody cage which was packed with allograft and beta tricalcium phosphate. It was then gently positioned in the intravertebral disc space and countersunk.   The superior distraction pin was then again removed and bone bleeding was stopped with bone  wax. The distraction pin was inserted at  the most inferior level. The annulus was incised and the disc space entered. Discectomy was performed with micro-curettes and pituitary and kerrison rongeurs. I then used the high-speed drill to drill the endplates down to the level of the posterior longitudinal ligament. The operating microscope was returned to the field.  The discectomy was continued posteriorly thru the disc space. Posterior longitudinal ligament was opened with a nerve hook, and then removed along with disc herniation and osteophytes, decompressing the spinal canal and thecal sac. We then continued to remove osteophytic overgrowth and disc material decompressing the neural foramina and exiting nerve roots bilaterally. So by both visualization and palpation we felt we had an adequate decompression of the neural elements. We then measured the height of the intravertebral disc space and selected a third machined lordotic metal interbody cage which was packed with allograft and beta tricalcium phosphate. It was then gently positioned in the intravertebral disc space and countersunk.   I then inserted a titanium plate and placed six variable angle screws into the three superior vertebral bodies and two fixed angle screws at the inferior level and locked them into position. The wound was irrigated with bacitracin solution, checked for hemostasis which was established and confirmed. Once meticulous hemostasis was achieved a medium hemovac drain was placed. The platysma was closed with interrupted 3-0 undyed Vicryl suture, the subcuticular layer was closed with running monocryl stratafix suture. The skin edges were sealed with dermabond. The drapes were removed. A sterile dressing was applied. The patient was then awakened from general anesthesia and transferred to the recovery room in stable condition. At the end of the procedure all sponge, needle and instrument counts were correct.   PATIENT DISPOSITION:   PACU - hemodynamically stable.   Delay start of Pharmacological VTE agent (>24hrs) due to surgical blood loss or risk of bleeding:  yes

## 2016-08-06 NOTE — Anesthesia Procedure Notes (Signed)
Procedure Name: Intubation Date/Time: 08/06/2016 7:49 AM Performed by: Fransisca Kaufmann Pre-anesthesia Checklist: Patient identified, Emergency Drugs available, Suction available and Patient being monitored Patient Re-evaluated:Patient Re-evaluated prior to inductionOxygen Delivery Method: Circle System Utilized Preoxygenation: Pre-oxygenation with 100% oxygen Intubation Type: IV induction Ventilation: Mask ventilation without difficulty Laryngoscope Size: Miller and 2 Grade View: Grade I Tube type: Oral Tube size: 7.5 mm Number of attempts: 1 Airway Equipment and Method: Stylet and Oral airway (SOFT AIRWAY PER SURGEON REQUEST) Placement Confirmation: ETT inserted through vocal cords under direct vision,  positive ETCO2 and breath sounds checked- equal and bilateral Secured at: 22 cm Tube secured with: Tape Dental Injury: Teeth and Oropharynx as per pre-operative assessment

## 2016-08-06 NOTE — Progress Notes (Signed)
Orthopedic Tech Progress Note Patient Details:  Sydney Wolfe Jun 06, 1950 [id number]  Ortho Devices Type of Ortho Device: Soft collar Ortho Device/Splint Location: Soft Collar applied to pt Neck with Nurse assistance.  Pt tolerated well.  Ortho Device/Splint Interventions: Application, Adjustment   Alvina Chou 08/06/2016, 11:56 AM

## 2016-08-06 NOTE — Anesthesia Postprocedure Evaluation (Signed)
Anesthesia Post Note  Patient: Sydney Wolfe  Procedure(s) Performed: Procedure(s) (LRB): Cervical four-Cervical seven  Anterior cervical discectomy with fusion and plate fixation (N/A)  Patient location during evaluation: PACU Anesthesia Type: General Level of consciousness: awake Pain management: pain level controlled Vital Signs Assessment: post-procedure vital signs reviewed and stable Respiratory status: spontaneous breathing Cardiovascular status: stable Postop Assessment: no signs of nausea or vomiting Anesthetic complications: no       Last Vitals:  Vitals:   08/06/16 1145 08/06/16 1200  BP: 122/63 130/66  Pulse: 72 72  Resp: 15 16  Temp:      Last Pain:  Vitals:   08/06/16 1145  TempSrc:   PainSc: 3                  [employer]

## 2016-08-07 ENCOUNTER — Encounter (HOSPITAL_COMMUNITY): Payer: Self-pay | Admitting: Neurological Surgery

## 2016-08-07 MED ORDER — METHOCARBAMOL 750 MG PO TABS: 750.0000 mg | ORAL_TABLET | Freq: Three times a day (TID) | ORAL | 1 refills | Status: DC | PRN

## 2016-08-07 MED ORDER — OXYCODONE-ACETAMINOPHEN 7.5-325 MG PO TABS: 1.0000 | ORAL_TABLET | ORAL | 0 refills | Status: DC | PRN

## 2016-08-07 MED FILL — Thrombin For Soln 5000 Unit: CUTANEOUS | Qty: 5000 | Status: AC

## 2016-08-07 NOTE — Discharge Summary (Signed)
Physician Discharge Summary  Patient ID: Sydney Wolfe MRN: [id number] DOB/AGE: 1950/09/25 66 y.o.  Admit date: 08/06/2016 Discharge date: 08/07/2016  PRE-OPERATIVE DIAGNOSIS:  Cervical spondylosis with myelopathy and radiculopathy  POST-OPERATIVE DIAGNOSIS:  Same  Discharged Condition: Stable  Hospital Course:  Sydney Wolfe is a 66 y.o. female with cervical myeloradiculopathy presents for elective C4-7 anterior discectomy with fusion and plate fixation.  No new issues.  Treatments: Surgery - C4-7 anterior discectomy with fusion and plate fixation  Discharge Exam: Blood pressure 112/75, pulse 69, temperature 98.3 F (36.8 C), temperature source Oral, resp. rate 18, weight 96.2 kg (212 lb), SpO2 93 %. Awake, alert, oriented Speech fluent, appropriate CN grossly intact 5/5 BUE/BLE Wound c/d/i  Disposition: 01-Home or Self Care  Discharge Instructions    Call MD for:  difficulty breathing, headache or visual disturbances    Complete by:  As directed    Call MD for:  persistant dizziness or light-headedness    Complete by:  As directed    Call MD for:  redness, tenderness, or signs of infection (pain, swelling, redness, odor or green/yellow discharge around incision site)    Complete by:  As directed    Call MD for:  severe uncontrolled pain    Complete by:  As directed    Call MD for:  temperature >100.4    Complete by:  As directed    Diet general    Complete by:  As directed    Driving Restrictions    Complete by:  As directed    Do not drive until given clearance.   Increase activity slowly    Complete by:  As directed    Lifting restrictions    Complete by:  As directed    Do not lift anything >10lbs. Avoid bending and twisting in awkward positions. Avoid bending at the back.   May shower / Bathe    Complete by:  As directed    In 24 hours. Okay to wash wound with warm soapy water. Avoid scrubbing the wound. Pat dry.   Remove dressing in 24 hours    Complete  by:  As directed      Allergies as of 08/07/2016      Reactions   No Known Allergies       Medication List    TAKE these medications   acetaminophen 650 MG CR tablet Commonly known as:  TYLENOL Take 1,300 mg by mouth 2 (two) times daily.   BETIMOL 0.5 % ophthalmic solution Generic drug:  timolol Place 1 drop into both eyes 2 (two) times daily. In the evening put drop in Left eye only.   celecoxib 200 MG capsule Commonly known as:  CELEBREX Take 200 mg by mouth 2 (two) times daily.   colchicine 0.6 MG tablet Take 1 tablet (0.6 mg total) by mouth 2 (two) times daily as needed (gout).   cyanocobalamin 500 MCG tablet Take 500 mcg by mouth daily.   diltiazem 180 MG 24 hr capsule Commonly known as:  CARTIA XT Take 1 capsule (180 mg total) by mouth daily.   flecainide 100 MG tablet Commonly known as:  TAMBOCOR Take 1 tablet (100 mg total) by mouth 2 (two) times daily.   gabapentin 300 MG capsule Commonly known as:  NEURONTIN Take 300 mg by mouth 3 (three) times daily.   levothyroxine 50 MCG tablet Commonly known as:  SYNTHROID, LEVOTHROID Take 50 mcg by mouth daily before breakfast.   methocarbamol 750 MG tablet Commonly  known as:  ROBAXIN-750 Take 1 tablet (750 mg total) by mouth 3 (three) times daily as needed for muscle spasms.   nitroGLYCERIN 0.4 MG SL tablet Commonly known as:  NITROSTAT Place 1 tablet (0.4 mg total) under the tongue every 5 (five) minutes as needed for chest pain.   ORENCIA IV Inject into the vein as directed.   oxyCODONE-acetaminophen 7.5-325 MG tablet Commonly known as:  PERCOCET Take 1-2 tablets by mouth every 4 (four) hours as needed. Take 1-2 tablets by mouth as needed for pain.   predniSONE 5 MG tablet Commonly known as:  DELTASONE Take 5 mg by mouth daily with breakfast. Tapering dose   rivaroxaban 20 MG Tabs tablet Commonly known as:  XARELTO Take 1 tablet (20 mg total) by mouth daily with supper.   rosuvastatin 5 MG  tablet Commonly known as:  CRESTOR Take 1 tablet (5 mg total) by mouth every other day.   Vitamin D 2000 units tablet Take 2,000 Units by mouth daily.      Follow-up Information    Loura Halt Ditty, MD. Schedule an appointment as soon as possible for a visit in 3 week(s).   Specialty:  Neurosurgery Contact information: [address] [place] [address] [place] [address] [phone]           Signed: Alyson Ingles 08/07/2016, 8:44 AM

## 2016-08-07 NOTE — Evaluation (Signed)
Physical Therapy Evaluation Patient Details Name: Sydney Wolfe MRN: [id number] DOB: 1950/12/23 Today's Date: 08/07/2016   History of Present Illness  66 yo female s/p C4-7 fusion  Clinical Impression  Pt admitted with above diagnosis. Pt currently with functional limitations due to the deficits listed below (see PT Problem List). At the time of PT eval pt was able to perform transfers and ambulation with close guard to supervision for safety. Pt with a history of scoliosis which is effecting posture slightly, however overall is able to remain upright during functional mobility. Pt will benefit from skilled PT to increase their independence and safety with mobility to allow discharge to the venue listed below.       Follow Up Recommendations Outpatient PT;Supervision - Intermittent    Equipment Recommendations  None recommended by PT    Recommendations for Other Services       Precautions / Restrictions Precautions Precautions: Cervical Precaution Comments: soft brace Required Braces or Orthoses: Cervical Brace Restrictions Weight Bearing Restrictions: No      Mobility  Bed Mobility Overal bed mobility: Needs Assistance Bed Mobility: Rolling;Sidelying to Sit Rolling: Modified independent (Device/Increase time) Sidelying to sit: Supervision       General bed mobility comments: VC's for proper log roll technique.   Transfers Overall transfer level: Needs assistance Equipment used: None Transfers: Sit to/from Stand Sit to Stand: Min guard         General transfer comment: VC's for hand placement on seated surface for safety.   Ambulation/Gait Ambulation/Gait assistance: Supervision Ambulation Distance (Feet): 400 Feet Assistive device: None Gait Pattern/deviations: Step-through pattern;Decreased stride length Gait velocity: Dec reased Gait velocity interpretation: Below normal speed for age/gender General Gait Details: Very stiff UE with no apparent natural arm  swing. Pt was cued for more relaxed posture and to                                                                                      Stairs Stairs: Yes Stairs assistance: Supervision Stair Management: One rail Right;Step to pattern;Forwards Number of Stairs: 3 General stair comments: VC's for sequencing and safety  Wheelchair Mobility    Modified Rankin (Stroke Patients Only)       Balance Overall balance assessment: Needs assistance Sitting-balance support: Feet supported;No upper extremity supported Sitting balance-Leahy Scale: Good     Standing balance support: No upper extremity supported;During functional activity Standing balance-Leahy Scale: Fair                               Pertinent Vitals/Pain Pain Assessment: Faces Faces Pain Scale: Hurts a little bit Pain Location: neck Pain Descriptors / Indicators: Sore    Home Living Family/patient expects to be discharged to:: Private residence Living Arrangements: Spouse/significant other Available Help at Discharge: Family Type of Home: House Home Access: Stairs to enter   Secretary/administrator of Steps: 2 Home Layout: One level Home Equipment: Shower seat;Hand held shower head      Prior Function Level of Independence: Independent         Comments: Works, Theatre stage manager  Dominant Hand: Right    Extremity/Trunk Assessment   Upper Extremity Assessment Upper Extremity Assessment: Defer to OT evaluation    Lower Extremity Assessment Lower Extremity Assessment: Generalized weakness    Cervical / Trunk Assessment Cervical / Trunk Assessment: Other exceptions (s/p surg)  Communication   Communication: No difficulties  Cognition Arousal/Alertness: Awake/alert Behavior During Therapy: WFL for tasks assessed/performed Overall Cognitive Status: Within Functional Limits for tasks assessed                      General Comments      Exercises      Assessment/Plan    PT Assessment Patient needs continued PT services  PT Problem List Decreased strength;Decreased range of motion;Decreased activity tolerance;Decreased balance;Decreased mobility;Decreased knowledge of use of DME;Decreased safety awareness;Decreased knowledge of precautions;Pain       PT Treatment Interventions DME instruction;Gait training;Stair training;Functional mobility training;Therapeutic activities;Therapeutic exercise;Neuromuscular re-education;Patient/family education    PT Goals (Current goals can be found in the Care Plan section)  Acute Rehab PT Goals Patient Stated Goal: Home at d/c PT Goal Formulation: With patient Time For Goal Achievement: 08/14/16 Potential to Achieve Goals: Good    Frequency Min 5X/week   Barriers to discharge        Co-evaluation               End of Session Equipment Utilized During Treatment: Gait belt Activity Tolerance: Patient tolerated treatment well Patient left: in chair;with call bell/phone within reach Nurse Communication: Mobility status PT Visit Diagnosis: Unsteadiness on feet (R26.81);Pain         Time: 9449-6759 PT Time Calculation (min) (ACUTE ONLY): 25 min   Charges:   PT Evaluation $PT Eval Moderate Complexity: 1 Procedure PT Treatments $Gait Training: 8-22 mins   PT G Codes:         Marylynn Pearson 08/07/2016, 12:19 PM   Conni Slipper, PT, DPT Acute Rehabilitation Services Pager: [phone]

## 2016-08-07 NOTE — Progress Notes (Signed)
Pt doing well. Pt and husband given D/C instructions with Rx's, verbal understanding was provided. Pt's incision is clean and dry with no sign of infection. Pt's IV was removed prior to D/C. Pt D/C'd home via wheelchair @ 1300 per MD order. Pt is stable @ D/C and has no other needs at this time. Rema Fendt, RN

## 2016-08-07 NOTE — Evaluation (Signed)
Occupational Therapy Evaluation Patient Details Name: Sydney Wolfe MRN: [id number] DOB: 1950/10/13 Today's Date: 08/07/2016    History of Present Illness 66 yo female s/p C4-7 fusion  Past Medical History:  Diagnosis Date  . Arthritis   . Dyspnea    with exertion    . Gout   . Heart murmur   . 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 Hot flashes   . Hyperlipidemia   . Hypertension   . Hypothyroidism   . Normal nuclear stress test 2004  . OA (osteoarthritis)   . Obesity   . PAF (paroxysmal atrial fibrillation) (HCC) 01/2016  . Pericarditis   . [name] (right bundle branch block)       Clinical Impression   Patient evaluated by Occupational Therapy with no further acute OT needs identified. All education has been completed and the patient has no further questions. See below for any follow-up Occupational Therapy or equipment needs. OT to sign off. Thank you for referral.      Follow Up Recommendations  No OT follow up    Equipment Recommendations  None recommended by OT    Recommendations for Other Services       Precautions / Restrictions Precautions Precautions: Cervical Precaution Comments: soft brace Required Braces or Orthoses: Cervical Brace      Mobility Bed Mobility               General bed mobility comments: in chair on arrival  Transfers Overall transfer level: Needs assistance   Transfers: Sit to/from Stand Sit to Stand: Min guard              Balance                                            ADL Overall ADL's : Modified independent     Cervical precautions ( handout provided): Educated patient on don doff brace with return demonstration, educated on oral care using cups, washing face with cloth, never to wash directly on incision site, avoid neck rotation flexion and extension, positioning with pillows in chair for bil UE, sleeping positioning, avoiding pushing / pulling with  bil UE  Pt educated on need to notify doctor / RN of swallowing changes or choking..                                           Vision Baseline Vision/History: Wears glasses Wears Glasses: Reading only       Perception     Praxis      Pertinent Vitals/Pain Pain Assessment: Faces Faces Pain Scale: Hurts a little bit Pain Location: neck Pain Descriptors / Indicators: Sore Pain Intervention(s): Monitored during session;Premedicated before session;Repositioned     Hand Dominance Right   Extremity/Trunk Assessment Upper Extremity Assessment Upper Extremity Assessment: Overall WFL for tasks assessed;LUE deficits/detail LUE Deficits / Details: Reports hand hurts at MCP but likely RA per patient   Lower Extremity Assessment Lower Extremity Assessment: Defer to PT evaluation   Cervical / Trunk Assessment Cervical / Trunk Assessment: Other exceptions (s/p surg)   Communication Communication Communication: No difficulties   Cognition Arousal/Alertness: Awake/alert Behavior During Therapy: WFL for tasks assessed/performed Overall Cognitive Status: Within Functional Limits for  tasks assessed                     General Comments  educated on don doff brace    Exercises       Shoulder Instructions      Home Living Family/patient expects to be discharged to:: Private residence Living Arrangements: Spouse/significant other Available Help at Discharge: Family Type of Home: House Home Access: Stairs to enter Secretary/administrator of Steps: 2   Home Layout: One level     Bathroom Shower/Tub: Tub/shower unit Shower/tub characteristics: Engineer, building services: Handicapped height     Home Equipment: Shower seat;Hand held shower head          Prior Functioning/Environment Level of Independence: Independent        Comments: Works, drives        OT Problem List:        OT Treatment/Interventions:      OT Goals(Current goals can be  found in the care plan section) Acute Rehab OT Goals Potential to Achieve Goals: Good  OT Frequency:     Barriers to D/C:            Co-evaluation              End of Session Nurse Communication: Mobility status;Precautions  Activity Tolerance: Patient tolerated treatment well Patient left: in chair;with call bell/phone within reach;with family/visitor present  OT Visit Diagnosis: Unsteadiness on feet (R26.81)                ADL either performed or assessed with clinical judgement  Time: 8413-2440 OT Time Calculation (min): 14 min Charges:  OT General Charges $OT Visit: 1 Procedure OT Evaluation $OT Eval Moderate Complexity: 1 Procedure G-Codes:      Mateo Flow   OTR/L Pager: [phone] Office: [phone] .   Boone Master B 08/07/2016, 8:35 AM

## 2016-08-07 NOTE — Progress Notes (Signed)
No acute overnight events Doing well Moving arms and legs with full strength D/c

## 2016-10-02 ENCOUNTER — Ambulatory Visit: Payer: Medicare Other | Admitting: Internal Medicine

## 2016-11-19 ENCOUNTER — Encounter: Payer: Self-pay | Admitting: Internal Medicine

## 2016-11-19 ENCOUNTER — Ambulatory Visit (INDEPENDENT_AMBULATORY_CARE_PROVIDER_SITE_OTHER): Payer: Medicare Other | Admitting: Internal Medicine

## 2016-11-19 ENCOUNTER — Ambulatory Visit (INDEPENDENT_AMBULATORY_CARE_PROVIDER_SITE_OTHER)
Admission: RE | Admit: 2016-11-19 | Discharge: 2016-11-19 | Disposition: A | Discharge status: Home / Self Care | Payer: Medicare Other | Source: Ambulatory Visit | Attending: Internal Medicine | Admitting: Internal Medicine

## 2016-11-19 VITALS — BP 116/72 | HR 91 | Ht 69.0 in | Wt 207.0 lb

## 2016-11-19 DIAGNOSIS — R0609 Other forms of dyspnea: Secondary | ICD-10-CM

## 2016-11-19 DIAGNOSIS — R0602 Shortness of breath: Secondary | ICD-10-CM

## 2016-11-19 LAB — PULMONARY FUNCTION TEST
DL/VA % PRED: 87 %
DL/VA: 4.77 ml/min/mmHg/L
DLCO cor % pred: 65 %
DLCO cor: 21.17 ml/min/mmHg
DLCO unc % pred: 69 %
DLCO unc: 22.58 ml/min/mmHg
FEF 25-75 Post: 2.73 L/sec
FEF 25-75 Pre: 2.56 L/sec
FEF2575-%CHANGE-POST: 6 %
FEF2575-%PRED-POST: 113 %
FEF2575-%Pred-Pre: 106 %
FEV1-%CHANGE-POST: 0 %
FEV1-%Pred-Post: 75 %
FEV1-%Pred-Pre: 74 %
FEV1-PRE: 2.2 L
FEV1-Post: 2.21 L
FEV1FVC-%CHANGE-POST: 3 %
FEV1FVC-%Pred-Pre: 107 %
FEV6-%Change-Post: -2 %
FEV6-%PRED-POST: 69 %
FEV6-%PRED-PRE: 71 %
FEV6-PRE: 2.66 L
FEV6-Post: 2.59 L
FEV6FVC-%Change-Post: 0 %
FEV6FVC-%PRED-PRE: 104 %
FEV6FVC-%Pred-Post: 104 %
FVC-%CHANGE-POST: -2 %
FVC-%PRED-POST: 67 %
FVC-%Pred-Pre: 68 %
FVC-Post: 2.59 L
FVC-Pre: 2.66 L
POST FEV1/FVC RATIO: 86 %
POST FEV6/FVC RATIO: 100 %
PRE FEV6/FVC RATIO: 100 %
Pre FEV1/FVC ratio: 83 %
RV % PRED: 86 %
RV: 2.09 L
TLC % pred: 79 %
TLC: 4.75 L

## 2016-11-19 NOTE — Progress Notes (Signed)
PFT done today. 

## 2016-11-19 NOTE — Patient Instructions (Addendum)
Please remember to go to the  x-ray department downstairs in the basement  for your tests - we will call you with the results when they are available.      If you are satisfied with your treatment plan,  let your doctor know and he/she can either refill your medications or you can return here when your prescription runs out.     If in any way you are not 100% satisfied,  please tell us.  If 100% better, tell your friends!  Pulmonary follow up is as needed   

## 2016-11-19 NOTE — Progress Notes (Signed)
Subjective:    Patient ID: Sydney Wolfe, female    DOB: [birth year],     MRN: [id number]    Brief patient profile:  66yowf never smoker some rhinitis spring and fall completed allergy shots around 2013/14 and no need for any meds since or ever dx with asthma but bothered by chronic back problems T1/T2 with L shoulder discomfort new pattern of pain around T 6- 8 distribution assoc with sob  and bad arthritis toes knees wrists  starting Nov 2016 dx as sero neg RA/ djd  by Dr Trudie Reed but ? Poor  response to Remicade  So back to meloxicam and orencia  but pain got worse and eventually admitted to [facility]:  Admission date:  02/09/2016    Discharge Date:  02/11/2016   Admission Diagnosis  Nonspecific chest pain [R07.9]   Discharge Diagnosis  Nonspecific chest pain [R07.9]    Active Problems:   Chest pain, mid sternal   Essential hypertension   Abnormal ECG   Chest pain   Rheumatoid arthritis ([place])   Dysphagia   Neck pain          Past Medical History:  Diagnosis Date  . Arthritis   . Gout   . Heart murmur   . 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 Hot flashes   . Hyperlipidemia   . Hypertension   . Hypothyroidism   . Normal nuclear stress test 2004  . OA (osteoarthritis)   . Obesity   . [name] (right bundle branch block)          Past Surgical History:  Procedure Laterality Date  . CARDIAC CATHETERIZATION N/A 02/09/2016   Procedure: Left Heart Cath and Coronary Angiography;  Surgeon: Jettie Booze, MD;  Location: [facility];  Service: Cardiovascular;  Laterality: N/A;  . CARDIOVASCULAR STRESS TEST  08/20/2002   EF 76%  . CARPAL TUNNEL RELEASE    . CHOLECYSTECTOMY  1987  . DILATION AND CURETTAGE OF UTERUS  multiple  . HAND SURGERY  1987 and 1988   bilateral  . SPINE SURGERY  08/2008   T2  . TUBAL LIGATION  1975  . US ECHOCARDIOGRAPHY  07/25/2004   EF 55-60%       History of present  illness and  Hospital Course:     Kindly see H&P for history of present illness and admission details, please review complete Labs, Consult reports and Test reports for all details in brief  HPI  from the history and physical done on the day of admission 02/09/2016  HPI: This is a 66yo obese WF with a history of RA, Hyperlipidemia, HTN who is followed by Dr. Acie Fredrickson for chronic [name] and prior history of CP with normal stress myoview and normal cardiac MRI in 2014. She recently saw him for complaints of DOE and chest pain when climbing stairs and other exertional activities. She described it as a weight on her chest. She underwent nuclear stress test that was low risk with fixed apical defect. Since then she has continued to have DOE. Her and her husband were out yesterday and she developed pain across the back of her neck. This was intermittent in severity throughout the day but never went completely away and radiated into her chest. She was able to sleep last night but this am when she awakened she had sudden onset of severe squeezing pain in her left chest with radiation into her jaw and  left arm heaviness. She came to [facility] ER. In ER she is very uncomfortable complaining of severe CP somewhat worse with palpation of her chest wall and somewhat worse with inspiration. Her initial O2 sats on RA were 97%. She denies any recent long distance travel (last trip to [place] 11/28/2015). She has a strong family history of CAD with her dad having an MI and her brother died of an MI. Cardiology is now asked to evaluate.   Hospital Course  66 y.o.femalewith medical history significant of HTN, rheumatoid arthritis, [name], Admitted with complaints of chest pain, seen by cardiology, status post cardiac cath which did show normal coronaries, has elevated D dimers, CTA chest negative for PE, [facility] hospitalist consulted for noncardiac chest pain.   Chest pain - appears to be musculoskeletal, cardiac  cath with no Significant coronary artery disease, CTA chest negative for PE. - Unclear to his chest pain is related to RA flare, but this is not typical for her previous flares  Rheumatoid arthritis with possible flare - Patient with elevated CRP and ESR, no baseline to compare to, reports improvement on one dose of IV steroids, so will start on prednisone taper .  Dysphagia - Patient reports it is improving, recommendation for outpatient evaluation if persists.  Fever/UTI - Possibly related to atelectasis versus UTI, treated with Rocephin during hospital stay, is another days of oral ciprofloxacin as an outpatient, encouraged to continue using incentive spirometry.  Acute hypoxic respiratory failure - Secondary to atelectasis and diastolic CHF - Patient reports progressive dyspnea over last few weeks, CT chest significant for atelectasis and small pleural effusion, improved with incentive spirometry , is to continue to use as an outpatient, as well evidence of small pleural effusion on CT chest, so started on low dose Lasix on discharge especially with evidence of grade 1 diastolic CHF on 2-D echo . - Resolved, oxygen saturation 91% on ambulation - Has a follow-up appointment with Dr. Melvyn Novas this Thursday  Cervical radiculopathy - MRI significant for radiculopathy, discussed with Dr. Cyndy Freeze, this can be followed as an outpatient     02/15/2016 1st [place] [facility] office visit/ Shadrach Bartunek   Chief Complaint  Patient presents with  . Pulmonary Consult    Referred by Dr. Gavin Pound. Pt c/o SOB and CP since Nov 2016. She states her chest hurts when she takes a deep breath. She states "I can't walk anywhere without getting SOB some days".   started prednisone in hospital but no better  Doe x across the room  Sleeps right side down Leg swelling was an issue x years but better on lasix s improvement in sob  Dysphagia variably better Cp migratory as is arthritis  - cp lasts minutes to  hours s pattern but may be better supine  rec Please remember to go to the   x-ray department downstairs for your tests - we will call you with the results when they are available. Try prilosec otc 53m  Take 30-60 min before first meal of the day and Pepcid ac (famotidine) 20 mg one @  bedtime until return Continue Incentive spirometry as much as you can Continue the gabapentin - may need to adjust up  Treatment consists of avoiding foods that cause gas (especially boiled eggs/ mPolandfood/ beans and raw vegetables like spinach and salads)  and citrucel 1 heaping tsp twice daily with a large glass of water.  Pain should improve w/in 2 weeks and if not then consider further GI work up.  GERD diet    03/18/2016  f/u ov/Jaimarie Rapozo re:  Chief Complaint  Patient presents with  . Follow-up    CP has resolved and her breathing has improved some.  Pt states that she was admitted to the hospital in [place] for pericarditis and a fib 02/16/16  Fatigue > sob since late summer 2017  Now newly on colchicine in addition to continuing  orencia infusions  Since admit to [facility] with dx pericarditis rec Set up pfts at [facility] for a baseline  To get the most out of exercise, you need to be continuously sob not oob   . 04/04/2016  f/u ov/Chisum Habenicht re: RA poor control  Chief Complaint  Patient presents with  . Follow-up    Here to review PFT results. She states breathing still not at her normal baseline. No new co's today.   doe across a football field but varies quite a bit  Just started doing ex bike x 60mn moderate resistance (lower than she was) and speed a little slower than baseline  rec More ex   August 06 2016  cx neck surgery    11/19/2016  f/u ov/Adaleah Forget re: RA  Chief Complaint  Patient presents with  . Follow-up    Pt here to discuss PFT results, Pt states her breathing is improving just not back to her baseline, slight chest tightness at time when she is sob Denies  coughing, wheezing  back on bike 15-18 min low resistance s stopping fatigue > sob  avg walking 3 miles per day up and steps  Doe / ex tol varies some with arthriitis severity  No obvious day to day or daytime variability or assoc excess/ purulent sputum or mucus plugs or hemoptysis or cp or chest tightness, subjective wheeze or overt sinus or hb symptoms. No unusual exp hx or h/o childhood pna/ asthma or knowledge of premature birth.  Sleeping ok without nocturnal  or early am exacerbation  of respiratory  c/o's or need for noct saba. Also denies any obvious fluctuation of symptoms with weather or environmental changes or other aggravating or alleviating factors except as outlined above   Current Medications, Allergies, Complete Past Medical History, Past Surgical History, Family History, and Social History were reviewed in [employer].  ROS  The following are not active complaints unless bolded sore throat, dysphagia, dental problems, itching, sneezing,  nasal congestion or excess/ purulent secretions, ear ache,   fever, chills, sweats, unintended wt loss, classically pleuritic or exertional cp,  orthopnea pnd or leg swelling, presyncope, palpitations, abdominal pain, anorexia, nausea, vomiting, diarrhea  or change in bowel or bladder habits, change in stools or urine, dysuria,hematuria,  rash, arthralgias, visual complaints, headache, numbness, weakness or ataxia or problems with walking or coordination,  change in mood/affect or memory.                   Objective:   Physical Exam   amb obese wf nad -    11/19/2016       207  04/04/2016       209  03/18/2016     206   02/15/16 210 lb (95.3 kg)  02/09/16 200 lb (90.7 kg)  12/20/15 217 lb (98.4 kg)    Vital signs reviewed  - Note on arrival 02 sats  96% on RA     HEENT: nl dentition, turbinates, and oropharynx. Nl external ear canals without cough reflex   NECK :  without JVD/Nodes/TM/ nl  carotid upstrokes bilaterally   LUNGS: no acc muscle use,  Nl contour chest which is clear to A and P bilaterally without cough on insp or exp maneuvers   CV:  RRR  no s3 or murmur or increase in P2, no edema   ABD:  soft and nontender with nl inspiratory excursion in the supine position. No bruits or organomegaly, bowel sounds nl  MS:  Nl gait/ ext warm without deformities, calf tenderness, cyanosis or clubbing No obvious joint restrictions   SKIN: warm and dry without lesions    NEURO:  alert, approp, nl sensorium with  no motor deficits      CXR PA and Lateral:   11/19/2016 :    I personally reviewed images  impression as follows:    no significant ILD        Assessment & Plan:

## 2016-11-20 NOTE — Assessment & Plan Note (Signed)
02/09/16 LHC neg CTa  02/09/16  Small effusions/ atx  - 02/15/2016  Walked RA x 3 laps @ 185 ft each stopped due to  End of study nl pace/ sob but no desat - PFT's  03/27/16  FVC  2.40 (62%)  s obst/ DLCO 53% corrects to 82% and ERV 20% c/w effects of obesity  - PFT's  11/19/2016  FVC 2.66 (68%)  with DLCO  69/65 % corrects to 87  % for alv volume     I had an extended final summary discussion with the patient reviewing all relevant studies completed to date and  lasting 15 to 20 minutes of a 25 minute visit on the following issues:    No evidence of significant ILD related to RA at this time, nor any other pulmonary complication of RA  Best option for surveillance is regular aerobic ex and pulmonary f/u if loosing ground or develops unexplained cough or if directed by her rheumatologist back her for pfts.  Each maintenance medication was reviewed in detail including most importantly the difference between maintenance and as needed and under what circumstances the prns are to be used.  Please see AVS for specific  Instructions which are unique to this visit and I personally typed out  which were reviewed in detail in writing with the patient and a copy provided.

## 2016-11-20 NOTE — Progress Notes (Signed)
Spoke with pt and notified of results per Dr. Wert. Pt verbalized understanding and denied any questions. 

## 2016-11-20 NOTE — Assessment & Plan Note (Signed)
pft's 11/19/2016 with erv = 30% likely related to body habitus   Body mass index is 30.57 kg/m.  -  trending down/ encouraged Lab Results  Component Value Date   TSH 2.228 02/09/2016     Contributing to gerd risk/ doe/reviewed the need and the process to achieve and maintain neg calorie balance > defer f/u primary care including intermittently monitoring thyroid status

## 2016-11-29 ENCOUNTER — Telehealth: Payer: Self-pay | Admitting: Cardiovascular Disease

## 2016-11-29 MED ORDER — METOPROLOL TARTRATE 25 MG PO TABS: 25.0000 mg | ORAL_TABLET | Freq: Two times a day (BID) | ORAL | 11 refills | Status: DC

## 2016-11-29 NOTE — Telephone Encounter (Signed)
Side effects of Diltiazem noted DC Diltiazem Start metoprolol 25 mg PO BID  She may increase the metoprolol to 50 BID if needed for HR control

## 2016-11-29 NOTE — Telephone Encounter (Signed)
Instructed patient to STOP DILTIAZEM and START METOPROLOL 25 mg BID. She will continue to monitor HR and will call if her HR is elevated so Metoprolol 50 mg BID can be called in. She will call if symptoms do not improve. Patient agrees with treatment plan.

## 2016-11-29 NOTE — Telephone Encounter (Signed)
New message    Pt c/o medication issue:  1. Name of Medication: diltiazem (CARTIA XT) 180 MG 24 hr capsule  2. How are you currently taking this medication (dosage and times per day)? 180mg   3. Are you having a reaction (difficulty breathing--STAT)? yes  4. What is your medication issue? Bad diarrhea from this medication and bad headaches and congestion

## 2016-11-29 NOTE — Telephone Encounter (Signed)
Patient called to report diarrhea and stomach discomfort for 1-1.5 months.  She originally thought one of her RA medications was the cause so she had it changed. Her symptoms did not subside.  After trial and error with several medications, she did not take her diltiazem yesterday morning and she took it at night instead. She had no symptoms all day but did have stomach upset after taking the medication later. She started Cardizem last fall, and did experience some intermittent stomach discomfort and diarrhea since then but brushed it off. Now the diarrhea is more frequent. She also reports headaches and congestion after taking Cardizem.  Other than stomach upset/diarrhea, occasional headaches and congestion, she is asymptomatic. She denies CP, SOB, palpitations. She requests medication recommendations from Dr. Elease Hashimoto.

## 2017-01-10 ENCOUNTER — Encounter: Payer: Self-pay | Admitting: Cardiovascular Disease

## 2017-01-10 ENCOUNTER — Ambulatory Visit (INDEPENDENT_AMBULATORY_CARE_PROVIDER_SITE_OTHER): Payer: Medicare Other | Admitting: Cardiovascular Disease

## 2017-01-10 VITALS — BP 110/80 | HR 71 | Ht 69.0 in | Wt 206.8 lb

## 2017-01-10 DIAGNOSIS — E782 Mixed hyperlipidemia: Secondary | ICD-10-CM

## 2017-01-10 DIAGNOSIS — I480 Paroxysmal atrial fibrillation: Secondary | ICD-10-CM

## 2017-01-10 DIAGNOSIS — Z7901 Long term (current) use of anticoagulants: Secondary | ICD-10-CM

## 2017-01-10 LAB — BASIC METABOLIC PANEL
BUN/Creatinine Ratio: 15 (ref 12–28)
BUN: 11 mg/dL (ref 8–27)
CALCIUM: 9.1 mg/dL (ref 8.7–10.3)
CO2: 22 mmol/L (ref 20–29)
Chloride: 104 mmol/L (ref 96–106)
Creatinine, Ser: 0.73 mg/dL (ref 0.57–1.00)
GFR calc Af Amer: 99 mL/min/{1.73_m2} (ref >59)
GFR, EST NON AFRICAN AMERICAN: 85 mL/min/{1.73_m2} (ref >59)
Glucose: 89 mg/dL (ref 65–99)
POTASSIUM: 4.5 mmol/L (ref 3.5–5.2)
Sodium: 141 mmol/L (ref 134–144)

## 2017-01-10 LAB — HEPATIC FUNCTION PANEL
ALBUMIN: 3.9 g/dL (ref 3.6–4.8)
ALT: 22 IU/L (ref 0–32)
AST: 27 IU/L (ref 0–40)
Alkaline Phosphatase: 76 IU/L (ref 39–117)
BILIRUBIN TOTAL: 0.6 mg/dL (ref 0.0–1.2)
Bilirubin, Direct: 0.14 mg/dL (ref 0.00–0.40)
Total Protein: 6.3 g/dL (ref 6.0–8.5)

## 2017-01-10 LAB — LIPID PANEL
CHOLESTEROL TOTAL: 121 mg/dL (ref 100–199)
Chol/HDL Ratio: 3.3 ratio (ref 0.0–4.4)
HDL: 37 mg/dL — ABNORMAL LOW (ref >39)
LDL CALC: 48 mg/dL (ref 0–99)
Triglycerides: 182 mg/dL — ABNORMAL HIGH (ref 0–149)
VLDL Cholesterol Cal: 36 mg/dL (ref 5–40)

## 2017-01-10 MED ORDER — RIVAROXABAN 20 MG PO TABS: 20.0000 mg | ORAL_TABLET | Freq: Every day | ORAL | 11 refills | Status: DC

## 2017-01-10 NOTE — Progress Notes (Signed)
Sydney Wolfe Date of Birth  1950-10-04 [facility]     [place] Office  [address]    [address]   [address] [place], [place]  [address]    [place], [place]  [address] [phone]  Fax  [phone]  [phone]  Fax [phone]  Problems: 1. Hypertension 2. Hyperlipidemia 3. Mild obesity. 4.  [name] 5. Rheumatoid arthritis  6. Hypothyroidism   Sydney Wolfe is a 66 y.o. female with the above noted hx.  She has done very well. She has not had any episodes of chest pain or shortness breath. She has been exercising on regular basis.  November 17, 2012:  She is tolerating the coreg well but has noticed some photosensitivity ( listed by epocrates also as a side effect)  Nov. 20, 2014:  Sydney Wolfe was admitted to the hosptial with CP several weeks ago.  She rule out of MI,  MRI of the heart was normal.  Stress myoview 04/13/13 was normal.   She had continued to have fatigue, variable BP.   She also has dyspnea.  Her 2 sisters have cardiac issues [name] Reavis)   Oct 04, 2013:  Sydney Wolfe is doing well.  Exercising reguarly.   She is tolerating the coreg.    December 15, 2014:  December 14, 2015:  Doing well from a cardiac standpoint She's been having more problems with her rheumatoid arthritis. She's now on Remicade infusions and takes naproxen on a regular basis. She's been getting exercise on a regular basis. She rides her stationary bike 4 times a week.  She has some shortness of breath and chest tightness with exertion -particularly when she climbs stairs. Like a weight on her chest ,  Pressure like sensation .  Resolves after she stops for about 30 seconds.    Oct. 24, 2017:  Was hospitalized in Sept. with pericarditis and atrial fib .   Was likely due to Rheumatoid arthritis .  She was originally hospitalized at [facility] in mid September with chest pain. Her cardiac enzymes were normal. Cardiac  cath revealed smooth and normal coronary arteries.  She  was discharged but then when she saw her medical doctor she was readmitted to [facility] and was found at pericarditis and atrial fibrillation.   She is slowly regaining her strength. She still has an unusual sensation in her chest. The pain is not nearly as bad as it was a month ago but she is still not quite back to her normal self.   Jan. 24, 2018: Sydney Wolfe is seen today .  Has remained in NSR .  Has some chronic dyspnea,  Sees Dr. Sherene Sires at [facility] .   Aug. 17, 2018:    Doing well from a cardiac standpoint. Had back surgery in March ( C4,5,6)   - did well      Current Outpatient Prescriptions on File Prior to Visit  Medication Sig Dispense Refill  . Abatacept (ORENCIA IV) Inject into the vein as directed.    [name] acetaminophen (TYLENOL) 650 MG CR tablet Take 1,300 mg by mouth 2 (two) times daily.     [name] BETIMOL 0.5 % ophthalmic solution Place 1 drop into both eyes 2 (two) times daily. In the evening put drop in Left eye only.    . celecoxib (CELEBREX) 200 MG capsule Take 200 mg by mouth 2 (two) times daily.     . Cholecalciferol (VITAMIN D) 2000 units tablet Take 2,000 Units by mouth  daily.    . colchicine 0.6 MG tablet Take 1 tablet (0.6 mg total) by mouth 2 (two) times daily as needed (gout). 60 tablet 3  . flecainide (TAMBOCOR) 100 MG tablet Take 1 tablet (100 mg total) by mouth 2 (two) times daily. 180 tablet 3  . levothyroxine (SYNTHROID, LEVOTHROID) 50 MCG tablet Take 50 mcg by mouth daily before breakfast.   5  . metoprolol tartrate (LOPRESSOR) 25 MG tablet Take 1 tablet (25 mg total) by mouth 2 (two) times daily. 60 tablet 11  . nitroGLYCERIN (NITROSTAT) 0.4 MG SL tablet Place 1 tablet (0.4 mg total) under the tongue every 5 (five) minutes as needed for chest pain. 25 tablet 3  . predniSONE (DELTASONE) 5 MG tablet Take 5 mg by mouth daily with breakfast. Tapering dose    . rivaroxaban (XARELTO) 20 MG TABS tablet Take 1 tablet (20 mg total) by mouth daily with  supper. 90 tablet 3  . rosuvastatin (CRESTOR) 5 MG tablet Take 1 tablet (5 mg total) by mouth every other day. 90 tablet 3   No current facility-administered medications on file prior to visit.     Allergies  Allergen Reactions  . No Known Allergies     Past Medical History:  Diagnosis Date  . Arthritis   . Dyspnea    with exertion    . Gout   . Heart murmur   . 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 Hot flashes   . Hyperlipidemia   . Hypertension   . Hypothyroidism   . Normal nuclear stress test 2004  . OA (osteoarthritis)   . Obesity   . PAF (paroxysmal atrial fibrillation) (HCC) 01/2016  . Pericarditis   . [name] (right bundle branch block)     Past Surgical History:  Procedure Laterality Date  . ANTERIOR CERVICAL DECOMP/DISCECTOMY FUSION N/A 08/06/2016   Procedure: Cervical four-Cervical seven  Anterior cervical discectomy with fusion and plate fixation;  Surgeon: Loura Halt Ditty, MD;  Location: [facility] OR;  Service: Neurosurgery;  Laterality: N/A;  . CARDIAC CATHETERIZATION N/A 02/09/2016   Procedure: Left Heart Cath and Coronary Angiography;  Surgeon: Corky Crafts, MD;  Location: [facility] INVASIVE CV LAB;  Service: Cardiovascular;  Laterality: N/A;  . CARDIOVASCULAR STRESS TEST  08/20/2002   EF 76%  . CARPAL TUNNEL RELEASE    . CHOLECYSTECTOMY  1987  . DILATION AND CURETTAGE OF UTERUS  multiple  . HAND SURGERY  1987 and 1988   bilateral  . SPINE SURGERY  08/2008   T2  . TUBAL LIGATION  1975  . US ECHOCARDIOGRAPHY  07/25/2004   EF 55-60%    History  Smoking Status  . Never Smoker  Smokeless Tobacco  . Never Used    History  Alcohol Use No    Family History  Problem Relation Age of Onset  . Heart failure Mother   . Rheum arthritis Mother   . Hypertension Father   . Stroke Father   . Diabetes Father   . Rheum arthritis Father     Reviw of Systems:  Reviewed in the HPI.  All other systems are negative.  Physical  Exam: Blood pressure 110/80, pulse 71, height 5\' 9"  (1.753 m), weight 206 lb 12.8 oz (93.8 kg), SpO2 95 %. General: Well developed, well nourished, in no acute distress.  Head: Normocephalic, atraumatic, sclera non-icteric, mucus membranes are moist,   Neck: Supple. Carotids are 2 + without bruits. No  JVD  Lungs: Clear bilaterally to auscultation.  Heart: regular rate.  normal  S1 S2. No murmurs, gallops or rubs.  Abdomen: Soft, non-tender, non-distended with normal bowel sounds. No hepatomegaly. No rebound/guarding. No masses.  Msk:  Strength and tone are normal  Extremities: No clubbing or cyanosis. No edema.  Distal pedal pulses are 2+ and equal bilaterally.  Neuro: Alert and oriented X 3. Moves all extremities spontaneously.  Psych:  Responds to questions appropriately with a normal affect.  ECG: Jan. 24, 2018:   NSR at 62.   [name] . NS T wave abn.   Assessment / Plan:   1.   PAF -  the patient was admitted to [facility] with CP - had a cath and was DC'd.   Was admited  [facility] the next day with persistent CP (Sept. 2017)  with acute pericarditis and paroxysmal atrial fibrillation. She was seen by Dr. Vilinda Boehringer.  She converted on diltiazem and flecainide.     At this point, she has not had any recurrent pericarditis or atrial fib and I think we can DC the flecainide.   I would like to continue the Xarelto     2. Actue pericarditis .  She presented to [facility] with acute chest pain in mid September, 2017. Her cardiac enzymes were negative. Cardiac catheterization revealed normal coronary arteries. She was later found to have acute pericarditis ( when she was readmitted to [facility] )  likely related to her rheumatoid arthritis. She was treated with colchicine.   Now just takes the colchicine as needed.    2. Hypertension - her blood pressure is well-controlled. Continue current medications.  3. Hyperlipidemia her lipids have been well  controlled. Will draw fasting lipids, liver enzymes, and basic medical profile today.  4. Mild obesity.  5. [name]:        [name], MD  01/10/2017 8:31 AM    [facility] [address] [place],  [address] [place], [place]  [address] Pager [phone] Phone: [phone]; Fax: [phone]

## 2017-01-10 NOTE — Patient Instructions (Signed)
Medication Instructions:  STOP Flecainide   Labwork: TODAY - Cholesterol, basic metabolic panel, liver panel  Your physician recommends that you return for lab work in: 6 months on the day of or a few days before your office visit with Dr. Elease Hashimoto.  You will need to FAST for this appointment - nothing to eat or drink after midnight the night before except water.   Testing/Procedures: None Ordered   Follow-Up: Your physician wants you to follow-up in: 6 months with Dr. Elease Hashimoto.  You will receive a reminder letter in the mail two months in advance. If you don't receive a letter, please call our office to schedule the follow-up appointment.   If you need a refill on your cardiac medications before your next appointment, please call your pharmacy.   Thank you for choosing [facility]! Eligha Bridegroom, RN [phone]

## 2017-01-31 ENCOUNTER — Encounter: Payer: Self-pay | Admitting: Cardiovascular Disease

## 2017-07-31 ENCOUNTER — Other Ambulatory Visit: Payer: Self-pay | Admitting: Cardiovascular Disease

## 2017-08-01 ENCOUNTER — Other Ambulatory Visit: Payer: Self-pay | Admitting: Cardiovascular Disease

## 2017-09-08 ENCOUNTER — Other Ambulatory Visit: Payer: Medicare Other | Admitting: *Deleted

## 2017-09-08 DIAGNOSIS — Z7901 Long term (current) use of anticoagulants: Secondary | ICD-10-CM

## 2017-09-08 DIAGNOSIS — E782 Mixed hyperlipidemia: Secondary | ICD-10-CM

## 2017-09-08 DIAGNOSIS — I480 Paroxysmal atrial fibrillation: Secondary | ICD-10-CM

## 2017-09-08 LAB — CBC
HEMATOCRIT: 42.2 % (ref 34.0–46.6)
Hemoglobin: 14.6 g/dL (ref 11.1–15.9)
MCH: 31.7 pg (ref 26.6–33.0)
MCHC: 34.6 g/dL (ref 31.5–35.7)
MCV: 92 fL (ref 79–97)
Platelets: 223 10*3/uL (ref 150–379)
RBC: 4.6 x10E6/uL (ref 3.77–5.28)
RDW: 13.5 % (ref 12.3–15.4)
WBC: 7 10*3/uL (ref 3.4–10.8)

## 2017-09-08 LAB — LIPID PANEL
CHOLESTEROL TOTAL: 150 mg/dL (ref 100–199)
Chol/HDL Ratio: 3.2 ratio (ref 0.0–4.4)
HDL: 47 mg/dL (ref >39)
LDL Calculated: 67 mg/dL (ref 0–99)
TRIGLYCERIDES: 178 mg/dL — AB (ref 0–149)
VLDL Cholesterol Cal: 36 mg/dL (ref 5–40)

## 2017-09-08 LAB — BASIC METABOLIC PANEL
BUN / CREAT RATIO: 13 (ref 12–28)
BUN: 9 mg/dL (ref 8–27)
CO2: 22 mmol/L (ref 20–29)
CREATININE: 0.71 mg/dL (ref 0.57–1.00)
Calcium: 9 mg/dL (ref 8.7–10.3)
Chloride: 103 mmol/L (ref 96–106)
GFR, EST AFRICAN AMERICAN: 102 mL/min/{1.73_m2} (ref >59)
GFR, EST NON AFRICAN AMERICAN: 88 mL/min/{1.73_m2} (ref >59)
GLUCOSE: 86 mg/dL (ref 65–99)
Potassium: 4 mmol/L (ref 3.5–5.2)
Sodium: 140 mmol/L (ref 134–144)

## 2017-09-08 LAB — HEPATIC FUNCTION PANEL
ALT: 23 IU/L (ref 0–32)
AST: 23 IU/L (ref 0–40)
Albumin: 3.9 g/dL (ref 3.6–4.8)
Alkaline Phosphatase: 66 IU/L (ref 39–117)
BILIRUBIN TOTAL: 0.5 mg/dL (ref 0.0–1.2)
Bilirubin, Direct: 0.15 mg/dL (ref 0.00–0.40)
Total Protein: 6.4 g/dL (ref 6.0–8.5)

## 2017-09-14 NOTE — Progress Notes (Signed)
[name] Date of Birth  03-03-1951 [facility]     [place] Office  [address]    [address]   [address] [place], [place]  [address]    [place], [place]  [address] [phone]  Fax  [phone]  [phone]  Fax [phone]  Problems: 1. Hypertension 2. Hyperlipidemia 3. Mild obesity. 4.  [name] 5. Rheumatoid arthritis  6. Hypothyroidism   Sydney Wolfe is a 67 y.o. female with the above noted hx.  She has done very well. She has not had any episodes of chest pain or shortness breath. She has been exercising on regular basis.  November 17, 2012:  She is tolerating the coreg well but has noticed some photosensitivity ( listed by epocrates also as a side effect)  Nov. 20, 2014:  Sydney Wolfe was admitted to the hosptial with CP several weeks ago.  She rule out of MI,  MRI of the heart was normal.  Stress myoview 04/13/13 was normal.   She had continued to have fatigue, variable BP.   She also has dyspnea.  Her 2 sisters have cardiac issues [name] Reavis)   Oct 04, 2013:  Sydney Wolfe is doing well.  Exercising reguarly.   She is tolerating the coreg.    December 15, 2014:  December 14, 2015:  Doing well from a cardiac standpoint She's been having more problems with her rheumatoid arthritis. She's now on Remicade infusions and takes naproxen on a regular basis. She's been getting exercise on a regular basis. She rides her stationary bike 4 times a week.  She has some shortness of breath and chest tightness with exertion -particularly when she climbs stairs. Like a weight on her chest ,  Pressure like sensation .  Resolves after she stops for about 30 seconds.    Oct. 24, 2017:  Was hospitalized in Sept. with pericarditis and atrial fib .   Was likely due to Rheumatoid arthritis .  She was originally hospitalized at [facility] in mid September with chest pain. Her cardiac enzymes were normal. Cardiac  cath revealed smooth and normal coronary arteries.  She  was discharged but then when she saw her medical doctor she was readmitted to [facility] and was found at pericarditis and atrial fibrillation.   She is slowly regaining her strength. She still has an unusual sensation in her chest. The pain is not nearly as bad as it was a month ago but she is still not quite back to her normal self.   Jan. 24, 2018: Sydney Wolfe is seen today .  Has remained in NSR .  Has some chronic dyspnea,  Sees Dr. Sherene Sires at [facility] .   Aug. 17, 2018:    Doing well from a cardiac standpoint. Had back surgery in March ( C4,5,6)   - did well   September 15, 2017:  Pt was seen last year following an episode of pericarditis  associates with atrial fib ( was seen by Vilinda Boehringer Sept, 2017  at [place]).  Pericarditis was likely due to Rheumatoid arthritis .   Converted on flecainide .  Flecainide was stopped.  She had a cath during that admission - normal cors  No palpitations to suggest atrial fib .  She had a lightheaded episode while walking several weeks . She was out walking back from the dumpster.   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  Exercises also  Recent labs show that her total cholesterol  is 150.  The LDL is 67.  Triglyceride level is 178.  We discussed increasing her exercise and working harder on her diet and exercise and weight loss program.    Current Outpatient Medications on File Prior to Visit  Medication Sig Dispense Refill  . Abatacept (ORENCIA IV) Inject into the vein as directed.    [name] acetaminophen (TYLENOL) 650 MG CR tablet Take 1,300 mg by mouth 2 (two) times daily.     [name] BETIMOL 0.5 % ophthalmic solution Place 1 drop into both eyes 2 (two) times daily. In the evening put drop in Left eye only.    . celecoxib (CELEBREX) 200 MG capsule Take 200 mg by mouth 2 (two) times daily.     . Cholecalciferol (VITAMIN D) 2000 units tablet Take 2,000 Units by mouth daily.    . colchicine 0.6 MG tablet Take 1 tablet (0.6 mg total) by mouth  2 (two) times daily as needed (gout). 60 tablet 3  . diphenoxylate-atropine (LOMOTIL) 2.5-0.025 MG tablet Take 4 tablets by mouth daily as needed for diarrhea or loose stools.     . folic acid (FOLVITE) 1 MG tablet Take 1 mg by mouth daily.    [name] levothyroxine (SYNTHROID, LEVOTHROID) 50 MCG tablet Take 50 mcg by mouth daily before breakfast.   5  . metoprolol tartrate (LOPRESSOR) 25 MG tablet Take 1 tablet (25 mg total) by mouth 2 (two) times daily. 60 tablet 11  . nitroGLYCERIN (NITROSTAT) 0.4 MG SL tablet Place 1 tablet (0.4 mg total) under the tongue every 5 (five) minutes as needed for chest pain. 25 tablet 3  . predniSONE (DELTASONE) 5 MG tablet Take 5 mg by mouth daily with breakfast. Tapering dose    . rivaroxaban (XARELTO) 20 MG TABS tablet Take 1 tablet (20 mg total) by mouth daily with supper. 30 tablet 11  . rosuvastatin (CRESTOR) 5 MG tablet TAKE 1 TABLET BY MOUTH EVERY OTHER DAY 45 tablet 0   No current facility-administered medications on file prior to visit.     Allergies  Allergen Reactions  . No Known Allergies     Past Medical History:  Diagnosis Date  . Arthritis   . Dyspnea    with exertion    . Gout   . Heart murmur   . 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 Hot flashes   . Hyperlipidemia   . Hypertension   . Hypothyroidism   . Normal nuclear stress test 2004  . OA (osteoarthritis)   . Obesity   . PAF (paroxysmal atrial fibrillation) (HCC) 01/2016  . Pericarditis   . [name] (right bundle branch block)     Past Surgical History:  Procedure Laterality Date  . ANTERIOR CERVICAL DECOMP/DISCECTOMY FUSION N/A 08/06/2016   Procedure: Cervical four-Cervical seven  Anterior cervical discectomy with fusion and plate fixation;  Surgeon: Loura Halt Ditty, MD;  Location: [facility] OR;  Service: Neurosurgery;  Laterality: N/A;  . CARDIAC CATHETERIZATION N/A 02/09/2016   Procedure: Left Heart Cath and Coronary Angiography;  Surgeon:  Corky Crafts, MD;  [facility];  Service: Cardiovascular;  Laterality: N/A;  . CARDIOVASCULAR STRESS TEST  08/20/2002   EF 76%  . CARPAL TUNNEL RELEASE    . CHOLECYSTECTOMY  1987  . DILATION AND CURETTAGE OF UTERUS  multiple  . HAND SURGERY  1987 and 1988   bilateral  . SPINE SURGERY  08/2008   T2  . TUBAL  LIGATION  1975  . US ECHOCARDIOGRAPHY  07/25/2004   EF 55-60%    Social History   Tobacco Use  Smoking Status Never Smoker  Smokeless Tobacco Never Used    Social History   Substance and Sexual Activity  Alcohol Use No    Family History  Problem Relation Age of Onset  . Heart failure Mother   . Rheum arthritis Mother   . Hypertension Father   . Stroke Father   . Diabetes Father   . Rheum arthritis Father     Reviw of Systems:  Noted in current history.  Physical Exam: Blood pressure 122/68, pulse 65, height 5\' 10"  (1.778 m), weight 217 lb (98.4 kg), SpO2 95 %.  GEN:  Well nourished, well developed in no acute distress HEENT: Normal NECK: No JVD; No carotid bruits LYMPHATICS: No lymphadenopathy CARDIAC: RR, no murmurs, rubs, gallops RESPIRATORY:  Clear to auscultation without rales, wheezing or rhonchi  ABDOMEN: Soft, non-tender, non-distended MUSCULOSKELETAL:  No edema; No deformity  SKIN: Warm and dry NEUROLOGIC:  Alert and oriented x 3  ECG: September 15, 2017: Normal sinus rhythm at 65.  Incomplete right bundle block.  Assessment / Plan:   1.   PAF -  the patient was admitted to [facility] with CP - had a cath and was DC'd.   Was admited  [facility] the next day with persistent CP (Sept. 2017)  with acute pericarditis and paroxysmal atrial fibrillation. She was seen by Dr. 05-02-1985.  She converted on diltiazem and flecainide.    He is doing well.  She has not had any recurrent episodes of atrial for ablation.  She is tolerating Xarelto quite well and is not had any bleeding. Continue to follow.    2. Actue  pericarditis .  She has not had any recurrent episodes of pericarditis.  3. Hyperlipidemia: Cholesterol levels are well controlled.  Her triglycerides are elevated.  Encouraged her to exercise on a regular basis.  Continue Crestor.  4. Mild obesity.  Encouraged diet, exercise, weight loss.     Vilinda Boehringer, MD  09/14/2017 7:31 PM    [facility] [place] [facility] [address] [place],  [address] [place], [place]  [place] Pager [phone] Phone: [phone]; Fax: [phone]

## 2017-09-15 ENCOUNTER — Encounter: Payer: Self-pay | Admitting: Cardiovascular Disease

## 2017-09-15 ENCOUNTER — Ambulatory Visit (INDEPENDENT_AMBULATORY_CARE_PROVIDER_SITE_OTHER): Payer: Medicare Other | Admitting: Cardiovascular Disease

## 2017-09-15 VITALS — BP 122/68 | HR 65 | Ht 70.0 in | Wt 217.0 lb

## 2017-09-15 DIAGNOSIS — I480 Paroxysmal atrial fibrillation: Secondary | ICD-10-CM | POA: Diagnosis not present

## 2017-09-15 DIAGNOSIS — E782 Mixed hyperlipidemia: Secondary | ICD-10-CM

## 2017-09-15 DIAGNOSIS — E781 Pure hyperglyceridemia: Secondary | ICD-10-CM | POA: Diagnosis not present

## 2017-09-15 NOTE — Patient Instructions (Signed)
Medication Instructions:  Your physician recommends that you continue on your current medications as directed. Please refer to the Current Medication list given to you today.   Labwork: Your physician recommends that you return for lab work in: 1 year on the day of or a few days before your office visit with Dr. Nahser.  You will need to FAST for this appointment - nothing to eat or drink after midnight the night before except water.   Testing/Procedures: None Ordered   Follow-Up: Your physician wants you to follow-up in: 1 year with Dr. Nahser.  You will receive a reminder letter in the mail two months in advance. If you don't receive a letter, please call our office to schedule the follow-up appointment.   If you need a refill on your cardiac medications before your next appointment, please call your pharmacy.   Thank you for choosing [facility]! Tymar Polyak, RN [phone]    

## 2017-10-03 IMAGING — RF DG CERVICAL SPINE 2 OR 3 VIEWS
1 series · 3 of 3 positions shown · non-contrast
Comparison: None.

FLUOROSCOPY TIME:  0 minutes, 10.8 seconds; 3 acquired images

CLINICAL DATA: Anterior fusion from C4-C7

EXAM:
CERVICAL SPINE - 2-3 VIEW; DG C-ARM 61-120 MIN

[Series 1: run · 3 of 3 slices shown]
[im 1/3]
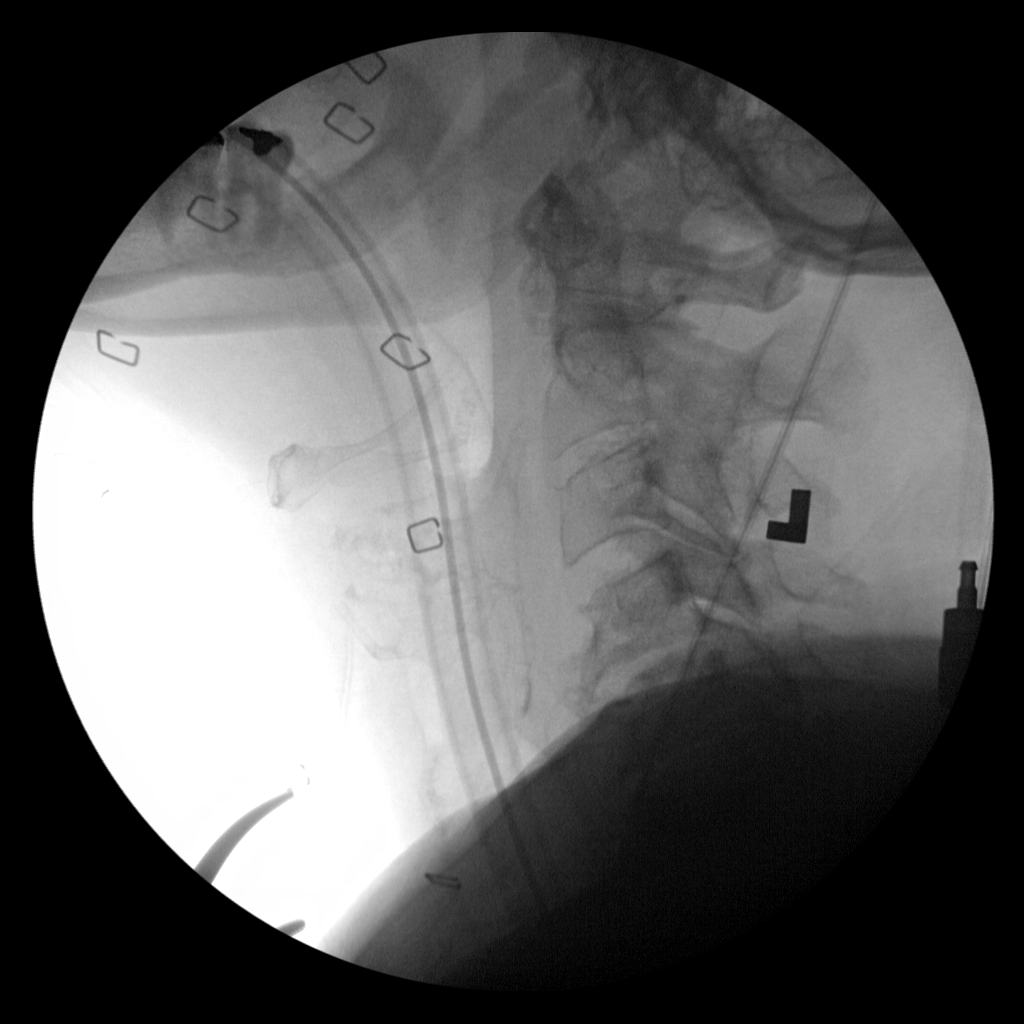
[im 2/3]
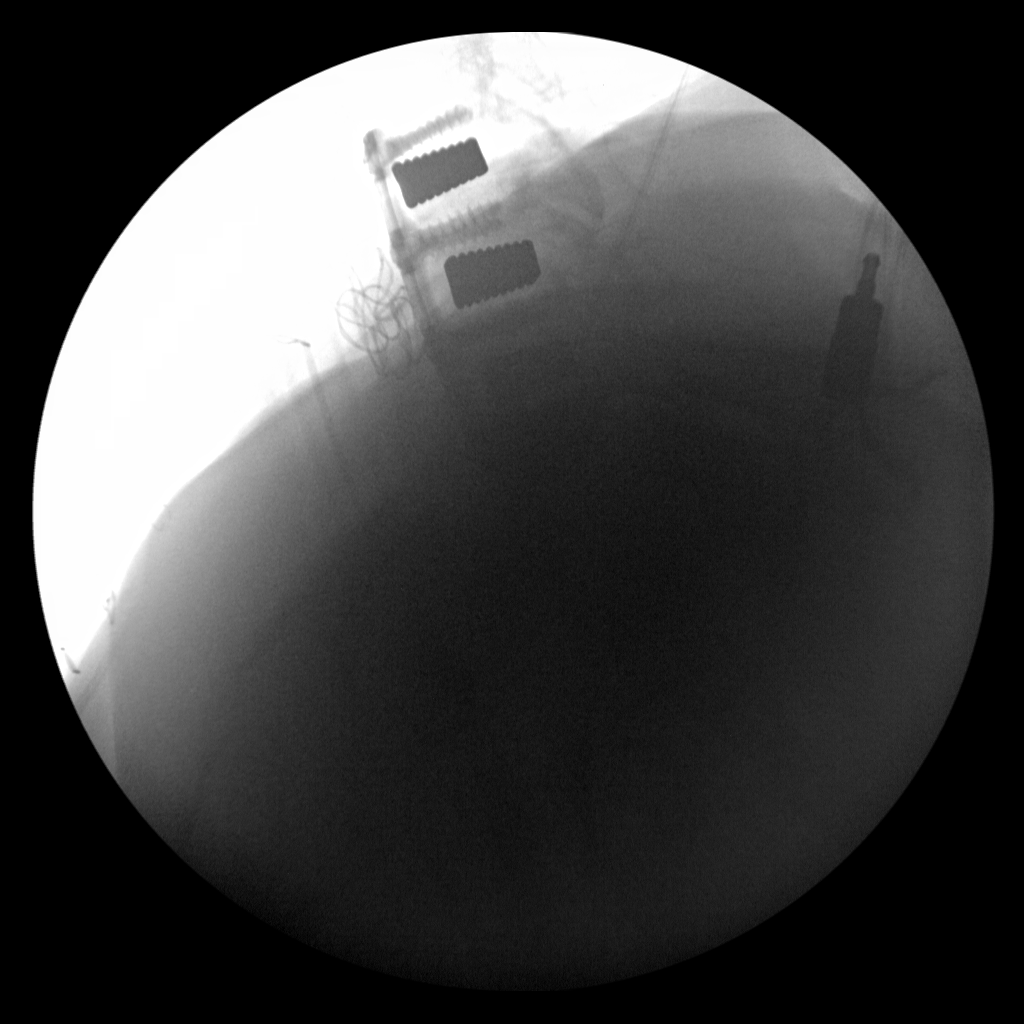
[im 3/3]
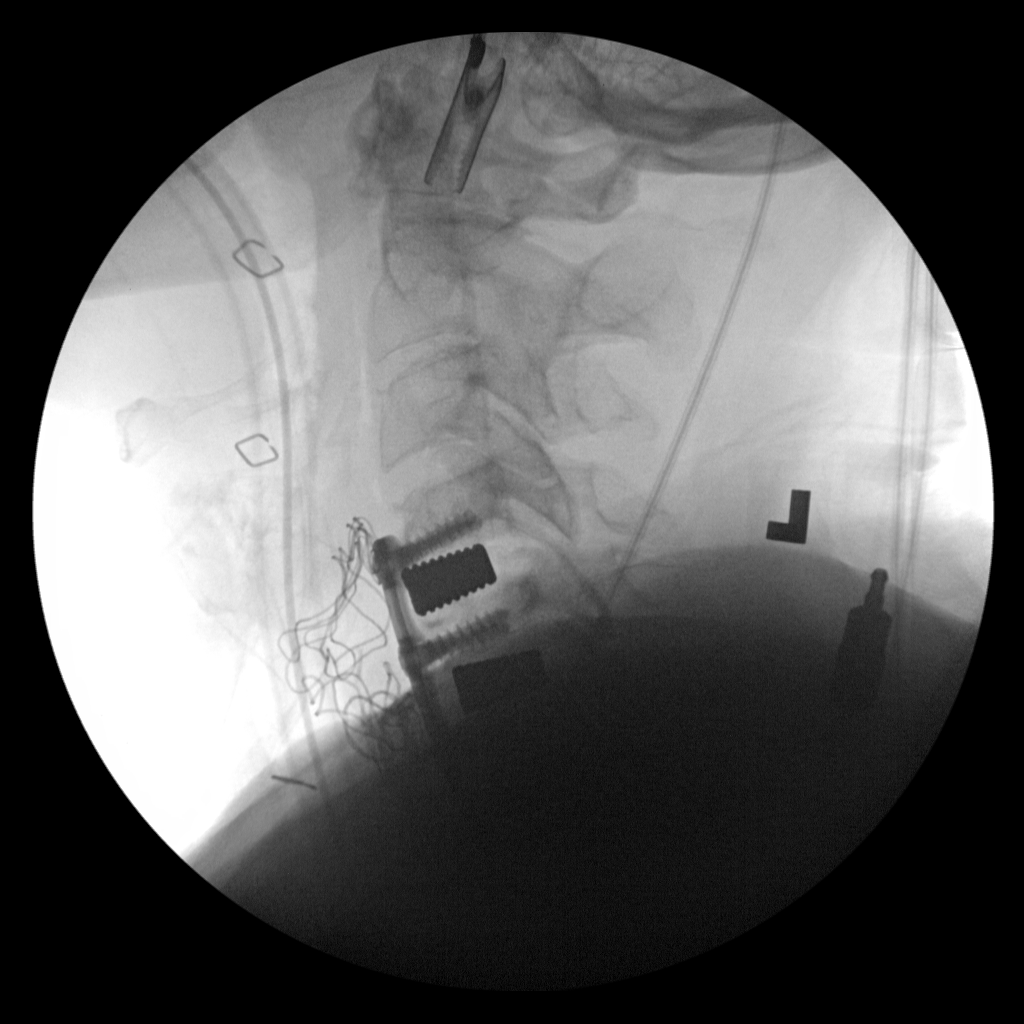

[3 of 3 positions shown; findings below may reference images not displayed]

FINDINGS: Initial cross-table lateral cervical image shows metallic probe well
anterior to the C4-5 interspace level. There is disc space narrowing
at C4-5 with a prominent anterior osteophyte at C4 inferiorly.
Subsequent cross-table lateral image shows anterior screw and plate
fixation from C4-C7 with disc spacers at C4-5 and C5-6. Apparent
sponge material is noted intraoperatively anterior to the screw and
plate fixation device. Disc spaces appear remarkable except for
moderate disc space narrowing at C2-3. No evident fracture or
spondylolisthesis.
IMPRESSION: Anterior screw and plate fixation from C4-C7 with disc spacers at
C4-5 and C5-6. Support hardware appears intact. Sponge material
noted anterior to the support hardware. No fracture or
spondylolisthesis evident. Moderate disc space narrowing noted at
C2-3.

## 2017-11-13 ENCOUNTER — Telehealth: Payer: Self-pay | Admitting: Cardiovascular Disease

## 2017-11-13 NOTE — Telephone Encounter (Signed)
Call was transferred into triage. The patient was seen today at [facility] and had a cleaning. She informed the dental office that she does not need pre medicated with antibiotics for cleaning but she may need pre medicated for other dental "work". The dental office is requesting to have this information in patient's chart.  I advised that Dr. Elease Hashimoto is out of the office but that I will forward to him and his nurse and someone will call them back or fax back whether or not SBE prophylaxis is needed due to patient's history of pericardial effusion.   The phone number is [phone].  The fax number is [phone].

## 2017-11-13 NOTE — Telephone Encounter (Signed)
New Message    Fleet Contras with [facility] is calling to see if the patient needs a clearance or a pre-med prior to cleaning. Patient is there now.

## 2017-11-17 ENCOUNTER — Encounter: Payer: Self-pay | Admitting: Nurse Practitioner

## 2017-11-17 NOTE — Telephone Encounter (Signed)
Spoke with Archie Patten from Dr[facility] dental office to review Dr. Harvie Bridge recommendations for patient. I advised I will also fax a copy to [phone] and she thanked me for my help.

## 2017-11-17 NOTE — Telephone Encounter (Signed)
Sydney Wolfe has a hx of paroxysmal atrial fib and is on Xarelto She does not need cardiac clear ence for any dental procedures She is on Xarelto so if she has extensive dental work she may have some increased bleeding  She may hold the Xarelto for 2 days prior to extensive dental procedure ( extractions, grafts, etc. )  Resume the Xarelto as soon as possible

## 2017-11-17 NOTE — Telephone Encounter (Signed)
Patient is aware of Dr. Harvie Bridge recommendations. She states she did not think she needed SBE prophylaxis but this was a new dentist that she was seeing for the first time. She thanked me for the call.

## 2017-11-17 NOTE — Telephone Encounter (Signed)
Left message for call back at [facility] and for patient

## 2017-12-05 ENCOUNTER — Other Ambulatory Visit: Payer: Self-pay | Admitting: Cardiovascular Disease

## 2018-01-24 ENCOUNTER — Other Ambulatory Visit: Payer: Self-pay | Admitting: Cardiovascular Disease

## 2018-01-27 ENCOUNTER — Other Ambulatory Visit: Payer: Self-pay | Admitting: Cardiovascular Disease

## 2018-01-27 MED ORDER — ROSUVASTATIN CALCIUM 5 MG PO TABS: 5.0000 mg | ORAL_TABLET | ORAL | 2 refills | Status: DC

## 2018-01-27 NOTE — Telephone Encounter (Signed)
Pt's medication was sent to pt's pharmacy as requested. Confirmation received.  °

## 2018-02-04 ENCOUNTER — Other Ambulatory Visit: Payer: Self-pay | Admitting: *Deleted

## 2018-02-04 MED ORDER — RIVAROXABAN 20 MG PO TABS: 20.0000 mg | ORAL_TABLET | Freq: Every day | ORAL | 5 refills | Status: DC

## 2018-02-04 NOTE — Telephone Encounter (Signed)
Xarelto 20mg  refill request received; pt is 67 yrs old, wt-98.4kg, Crea-0.71 on 09/08/17, last seen by Dr. 09/10/17 on 09/15/17, CrCl-117.19ml/min; will send in refill to requested pharmacy.

## 2018-05-04 ENCOUNTER — Telehealth: Payer: Self-pay | Admitting: Cardiovascular Disease

## 2018-05-04 NOTE — Telephone Encounter (Signed)
Pt called asking for samples of Xarelto 20 mg tablet, because pt is in the donut hole. I informed pt that I could leave  1 bottle of Xarelto 20 mg tablet for a week at the front desk and pt has any other problems, questions or concerns to call the office back. Pt verbalized understanding.

## 2018-08-10 ENCOUNTER — Other Ambulatory Visit: Payer: Self-pay

## 2018-08-10 MED ORDER — RIVAROXABAN 20 MG PO TABS: 20.0000 mg | ORAL_TABLET | Freq: Every day | ORAL | 3 refills | Status: DC

## 2018-08-10 NOTE — Telephone Encounter (Signed)
Pt's wt 98.4 kg, age 68, last labs 08/2017 [facility], serum creatinine 0.85, CrCl 98.4. Pt overdue for labs, but refilling Xarelto 20 mg once daily, #30 w/ 3 refills, as she is due to see Dr. Elease Hashimoto in 4/20 and pts are asked not to come in for nonessential reasons due to COVID-19.

## 2018-08-14 ENCOUNTER — Other Ambulatory Visit: Payer: Self-pay | Admitting: Cardiovascular Disease

## 2018-08-27 ENCOUNTER — Telehealth: Payer: Self-pay

## 2018-08-27 NOTE — Telephone Encounter (Signed)
Pt has given consent for a virtual/webex visit with Dr. Elease Hashimoto on 09/08/2018 at 9:20am. Pt has been advised to have HT, BP, and weight ready for appt.       YOUR CARDIOLOGY TEAM HAS ARRANGED FOR AN E-VISIT FOR YOUR APPOINTMENT - PLEASE REVIEW IMPORTANT INFORMATION BELOW SEVERAL DAYS PRIOR TO YOUR APPOINTMENT  Due to the recent COVID-19 pandemic, we are transitioning in-person office visits to tele-medicine visits in an effort to decrease unnecessary exposure to our patients and staff. Medicare and most insurances are covering these visits without a copay needed. You will need a working email and a smartphone or computer with a camera and microphone. For patients that do not have these items, we can still complete the visit using a telephone but do prefer video when possible. If possible, we also ask that you have a blood pressure cuff and scale at home to measure your blood pressure, heart rate and weight prior to your scheduled appointment. Patients with clinical needs that need an in-person evaluation and testing will still be able to come to the office if absolutely necessary. If you have any questions, feel free to call our office.     DOWNLOADING THE SOFTWARE  Download the [employer] app to enable video and telephone visits with your [facility] Provider.   Instructions for downloading [employer] WebEx: - Go to https://www.webex.com/downloads.html and follow the instructions, or download the app on your smartphone [facility] [employer] Meetings). - If you have technical difficulties with downloading WebEx, please call WebEx at [phone]. - Once the app is downloaded (can be done on either mobile or desktop computer), go to Settings in the upper left hand corner.  Be sure that camera and audio are enabled.  - You will receive an email message with a link to the meeting with a time to join for your tele-health visit.  - Please download the app and have settings configured prior to the appointment  time.      2-3 DAYS BEFORE YOUR APPOINTMENT  One of our staff will call you to confirm that you have been able to set up your WebEx account. We will remind you check your blood pressure, heart rate and weight prior to your scheduled appointment. If you have an Apple Watch or Kardia, please upload any pertinent ECG strips the day before or morning of your appointment to MyChart. Our staff will also make sure you have reviewed the consent and agree to move forward with your scheduled tele-health visit.    THE DAY OF YOUR APPOINTMENT  Approximately 15-20 minutes prior to your scheduled appointment, you will receive an e-mail directly from one of our staff member's [email or web address] e-mail accounts inviting you to join a WebEx meeting.  Please do not reply to that email - simply join the [employer].  Upon joining, a member of the office staff will speak with you initially through the WebEx platform to confirm medications, vital signs for the day and any symptoms you may be experiencing.  Please have this information available prior to the time of visit start.      CONSENT FOR TELE-HEALTH VISIT - PLEASE RVIEW  I hereby voluntarily request, consent and authorize [facility] and its employed or contracted physicians, physician assistants, nurse practitioners or other licensed health care professionals (the Practitioner), to provide me with telemedicine health care services (the "Services") as deemed necessary by the treating Practitioner. I acknowledge and consent to receive the Services by the Practitioner via telemedicine. I understand that  the telemedicine visit will involve communicating with the Practitioner through live audiovisual communication technology and the disclosure of certain medical information by electronic transmission. I acknowledge that I have been given the opportunity to request an in-person assessment or other available alternative prior to the telemedicine visit and am  voluntarily participating in the telemedicine visit.  I understand that I have the right to withhold or withdraw my consent to the use of telemedicine in the course of my care at any time, without affecting my right to future care or treatment, and that the Practitioner or I may terminate the telemedicine visit at any time. I understand that I have the right to inspect all information obtained and/or recorded in the course of the telemedicine visit and may receive copies of available information for a reasonable fee.  I understand that some of the potential risks of receiving the Services via telemedicine include:  [name]. Delay or interruption in medical evaluation due to technological equipment failure or disruption; . Information transmitted may not be sufficient (e.g. poor resolution of images) to allow for appropriate medical decision making by the Practitioner; and/or  . In rare instances, security protocols could fail, causing a breach of personal health information.  Furthermore, I acknowledge that it is my responsibility to provide information about my medical history, conditions and care that is complete and accurate to the best of my ability. I acknowledge that Practitioner's advice, recommendations, and/or decision may be based on factors not within their control, such as incomplete or inaccurate data provided by me or distortions of diagnostic images or specimens that may result from electronic transmissions. I understand that the practice of medicine is not an exact science and that Practitioner makes no warranties or guarantees regarding treatment outcomes. I acknowledge that I will receive a copy of this consent concurrently upon execution via email to the email address I last provided but may also request a printed copy by calling the office of [facility].    I understand that my insurance will be billed for this visit.   I have read or had this consent read to me. . I understand the  contents of this consent, which adequately explains the benefits and risks of the Services being provided via telemedicine.  . I have been provided ample opportunity to ask questions regarding this consent and the Services and have had my questions answered to my satisfaction. . I give my informed consent for the services to be provided through the use of telemedicine in my medical care  By participating in this telemedicine visit I agree to the above.

## 2018-09-08 ENCOUNTER — Encounter: Payer: Self-pay | Admitting: Cardiovascular Disease

## 2018-09-08 ENCOUNTER — Telehealth (INDEPENDENT_AMBULATORY_CARE_PROVIDER_SITE_OTHER): Payer: Medicare Other | Admitting: Cardiovascular Disease

## 2018-09-08 ENCOUNTER — Other Ambulatory Visit: Payer: Self-pay

## 2018-09-08 VITALS — BP 144/90 | HR 78 | Ht 70.0 in | Wt 203.0 lb

## 2018-09-08 DIAGNOSIS — Z7189 Other specified counseling: Secondary | ICD-10-CM | POA: Diagnosis not present

## 2018-09-08 DIAGNOSIS — I480 Paroxysmal atrial fibrillation: Secondary | ICD-10-CM | POA: Diagnosis not present

## 2018-09-08 DIAGNOSIS — E782 Mixed hyperlipidemia: Secondary | ICD-10-CM

## 2018-09-08 DIAGNOSIS — I10 Essential (primary) hypertension: Secondary | ICD-10-CM

## 2018-09-08 MED ORDER — RIVAROXABAN 20 MG PO TABS: 20.0000 mg | ORAL_TABLET | Freq: Every day | ORAL | 3 refills | Status: DC

## 2018-09-08 MED ORDER — METOPROLOL TARTRATE 25 MG PO TABS: 25.0000 mg | ORAL_TABLET | Freq: Two times a day (BID) | ORAL | 3 refills | Status: DC

## 2018-09-08 NOTE — Patient Instructions (Signed)
Medication Instructions:  Your physician recommends that you continue on your current medications as directed. Please refer to the Current Medication list given to you today.  If you need a refill on your cardiac medications before your next appointment, please call your pharmacy.   Lab work: Your physician recommends that you return for lab work in: 1-2 months when it is deemed safe from a Covid-19 perspective  You will need to FAST for this appointment - nothing to eat or drink after midnight the night before except water.    Testing/Procedures: HOW TO TAKE YOUR BLOOD PRESSURE:  Choose a time that is 1-2 hours after you have taken your BP medication  Rest 5 minutes before taking your blood pressure.   Don't smoke or drink caffeinated beverages for at least 30 minutes before.  Take your blood pressure before (not after) you eat or at least 30 minutes after a meal  Sit comfortably with your back supported and both feet on the floor (don't cross your legs).  Elevate your arm to heart level on a table or a desk.  Use the proper sized cuff. It should fit smoothly and snugly around your bare upper arm. There should be enough room to slip a fingertip under the cuff. The bottom edge of the cuff should be 1 inch above the crease of the elbow.  Record these readings daily and call or send a MyChart message to Dr. Elease Hashimoto   Follow-Up: At [facility], you and your health needs are our priority.  As part of our continuing mission to provide you with exceptional heart care, we have created designated Provider Care Teams.  These Care Teams include your primary Cardiologist (physician) and Advanced Practice Providers (APPs -  Physician Assistants and Nurse Practitioners) who all work together to provide you with the care you need, when you need it. You will need a follow up appointment in:  6 months.  Please call our office 2 months in advance to schedule this appointment.  You may see [name], MD or one of the following Advanced Practice Providers on your designated Care Team: Tereso Newcomer, PA-C [place], [place] . Berton Bon, NP

## 2018-09-08 NOTE — Progress Notes (Signed)
Virtual Visit via Video Note   This visit type was conducted due to national recommendations for restrictions regarding the COVID-19 Pandemic (e.g. social distancing) in an effort to limit this patient's exposure and mitigate transmission in our community.  Due to her co-morbid illnesses, this patient is at least at moderate risk for complications without adequate follow up.  This format is felt to be most appropriate for this patient at this time.  All issues noted in this document were discussed and addressed.  A limited physical exam was performed with this format.  Please refer to the patient's chart for her consent to [facility].   Evaluation Performed:  Follow-up visit  Date:  09/08/2018   ID:  Sydney Wolfe, DOB [birth year], MRN [id number]  Patient Location: Home  Provider Location: Home  PCP:  Eartha InchBadger, Michael C, MD  Cardiologist:  Kristeen MissPhilip Nahser, MD  Electrophysiologist:  None   Chief Complaint:  HTN, hyperlipidemia , PAF   History of Present Illness:    Sydney Nethamela R Labarge is a 68 y.o. female who presents via audio/video conferencing for a telehealth visit today.    Sydney Wolfe is a 68 yo with hx of HTN, hyperlipidemia,.  She was hospitalized in September, 2017 with pericarditis and atrial fibrillation.  Pericarditis was likely due to rheumatoid arthritis.  She had a heart catheterization which revealed smooth and normal coronary arteries. Has no further symptoms of AF Has a general decline of dyspnea Has some damage to one of her lungs ( scarring )  Able to do her normal activities Is seeing Dr . Robynn Panetool for pain managements. Getting shots. This has limited her exercise  no CP ,  No further AF   BP this am is 144/90  - fairly typical for her  Is avoids salty foods   Wearing mask,   Social distancing ,   The patient does not have symptoms concerning for COVID-19 infection (fever, chills, cough, or new shortness of breath).    Past Medical History:   Diagnosis Date  . Arthritis   . Dyspnea    with exertion    . Gout   . Heart murmur   . 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 Hot flashes   . Hyperlipidemia   . Hypertension   . Hypothyroidism   . Normal nuclear stress test 2004  . OA (osteoarthritis)   . Obesity   . PAF (paroxysmal atrial fibrillation) (HCC) 01/2016  . Pericarditis   . [name] (right bundle branch block)    Past Surgical History:  Procedure Laterality Date  . ANTERIOR CERVICAL DECOMP/DISCECTOMY FUSION N/A 08/06/2016   Procedure: Cervical four-Cervical seven  Anterior cervical discectomy with fusion and plate fixation;  Surgeon: Loura HaltBenjamin Jared Ditty, MD;  [facility];  Service: Neurosurgery;  Laterality: N/A;  . CARDIAC CATHETERIZATION N/A 02/09/2016   Procedure: Left Heart Cath and Coronary Angiography;  Surgeon: Corky CraftsJayadeep S Varanasi, MD;  [facility];  Service: Cardiovascular;  Laterality: N/A;  . CARDIOVASCULAR STRESS TEST  08/20/2002   EF 76%  . CARPAL TUNNEL RELEASE    . CHOLECYSTECTOMY  1987  . DILATION AND CURETTAGE OF UTERUS  multiple  . HAND SURGERY  1987 and 1988   bilateral  . SPINE SURGERY  08/2008   T2  . TUBAL LIGATION  1975  . US ECHOCARDIOGRAPHY  07/25/2004   EF 55-60%     Current Meds  Medication  Sig  . abatacept in sodium chloride 0.9 % Inject into the vein. Inject into the vein every thirty days  . BETIMOL 0.5 % ophthalmic solution Place 1 drop into both eyes 2 (two) times daily. In the evening put drop in Left eye only.  . celecoxib (CELEBREX) 200 MG capsule Take 200 mg by mouth 2 (two) times daily.   . Cholecalciferol (VITAMIN D) 2000 units tablet Take 2,000 Units by mouth daily.  . folic acid (FOLVITE) 1 MG tablet Take 1 mg by mouth daily.  [name] levothyroxine (SYNTHROID, LEVOTHROID) 50 MCG tablet Take 50 mcg by mouth daily before breakfast.   . meloxicam (MOBIC) 15 MG tablet Take 15 mg by mouth as needed for pain.  . metoprolol tartrate  (LOPRESSOR) 25 MG tablet TAKE 1 TABLET BY MOUTH TWICE A DAY  . nitroGLYCERIN (NITROSTAT) 0.4 MG SL tablet Place 1 tablet (0.4 mg total) under the tongue every 5 (five) minutes as needed for chest pain.  . predniSONE (DELTASONE) 5 MG tablet Take 5 mg by mouth daily with breakfast. Tapering dose  . rivaroxaban (XARELTO) 20 MG TABS tablet Take 1 tablet (20 mg total) by mouth daily with supper.  . rosuvastatin (CRESTOR) 5 MG tablet Take 1 tablet (5 mg total) by mouth every other day.     Allergies:   Patient has no known allergies.   Social History   Tobacco Use  . Smoking status: Never Smoker  . Smokeless tobacco: Never Used  Substance Use Topics  . Alcohol use: No  . Drug use: No     Family Hx: The patient's family history includes Diabetes in her father; Heart failure in her mother; Hypertension in her father; Rheum arthritis in her father and mother; Stroke in her father.  ROS:   Please see the history of present illness.     All other systems reviewed and are negative.   Prior CV studies:   The following studies were reviewed today:    Labs/Other Tests and Data Reviewed:    EKG:  No ECG reviewed.  Recent Labs: No results found for requested labs within last 8760 hours.   Recent Lipid Panel Lab Results  Component Value Date/Time   CHOL 150 09/08/2017 07:29 AM   TRIG 178 (H) 09/08/2017 07:29 AM   HDL 47 09/08/2017 07:29 AM   CHOLHDL 3.2 09/08/2017 07:29 AM   CHOLHDL 2.8 02/10/2016 02:40 AM   LDLCALC 67 09/08/2017 07:29 AM    Wt Readings from Last 3 Encounters:  09/08/18 203 lb (92.1 kg)  09/15/17 217 lb (98.4 kg)  01/10/17 206 lb 12.8 oz (93.8 kg)     Objective:    Vital Signs:  BP (!) 144/90   Pulse 78   Ht 5\' 10"  (1.778 m)   Wt 203 lb (92.1 kg)   BMI 29.13 kg/m    General:   Appears healthy,   NAD HEENT:   No obvious JVD or lymphadenopathy Resp:   Normal work of breathing,   resp rate is normal  CV :   BP and HR are normal ,  No edema Abd:   No  abdomina swelling , Ext:   No clubbing, cyanosis, or edema  Neuro:   Alert and oriented x 3.   No obvious motor deficits Skin : no obvious rashes    ASSESSMENT & PLAN:    1. HTN:     BP is slightly elevated today.  Her blood pressure has been slightly elevated for a while.  This may be worsened by her her intermittent prednisone therapy.  I would like to add hydrochlorothiazide and potassium but I would like to wait until we can bring her back to the office for lab work.  She will take her blood pressure daily and send [place] those results in 4 to 6 weeks.  If her blood pressure has remained elevated then we will start her on hydrochlorothiazide 25 mg a day and potassium chloride 20 mEq a day.  I will see her again in 6 months.  2.   PAF: She is not had any further episodes of paroxysmal atrial fibrillation.  Continue Xarelto.  We need to draw basic metabolic profile and a CBC in the next month or so.  3.  Hyperlipidemia: We will get fasting labs when we draw lab in 4 to 6 weeks.  COVID-19 Education: The signs and symptoms of COVID-19 were discussed with the patient and how to seek care for testing (follow up with PCP or arrange E-visit).  The importance of social distancing was discussed today.  Time:   Today, I have spent 21 minutes with the patient with telehealth technology discussing the above problems.     Medication Adjustments/Labs and Tests Ordered: Current medicines are reviewed at length with the patient today.  Concerns regarding medicines are outlined above.   Tests Ordered: No orders of the defined types were placed in this encounter.   Medication Changes: No orders of the defined types were placed in this encounter.   Disposition:  Follow up in 6 month(s)  Signed, [name], MD  09/08/2018 9:25 AM    [facility]

## 2018-09-11 ENCOUNTER — Ambulatory Visit: Payer: Medicare Other | Admitting: Cardiovascular Disease

## 2018-11-03 ENCOUNTER — Other Ambulatory Visit: Payer: Self-pay | Admitting: Cardiovascular Disease

## 2018-12-21 ENCOUNTER — Telehealth: Payer: Self-pay | Admitting: *Deleted

## 2018-12-21 NOTE — Telephone Encounter (Signed)
   Primary Cardiologist: Mertie Moores, MD  Chart reviewed as part of pre-operative protocol coverage. H/o HTN, HLD, pericarditis, RA, normal cors 2017, PAF, [name]. Doing well at last telemed OV 09/08/18 except BP elevated in setting of prednisone. Will route to pharm for input on Xarelto then pt will need call.  Charlie Pitter, PA-C 12/21/2018, 5:12 PM

## 2018-12-21 NOTE — Telephone Encounter (Signed)
   [facility] Pre-operative Risk Assessment    Request for surgical clearance:  1. What type of surgery is being performed? LEFT L3-4, L4-5, TFE SI  2. When is this surgery scheduled? TBD  3. What type of clearance is required (medical clearance vs. Pharmacy clearance to hold med vs. Both)? BOTH  4. Are there any medications that need to be held prior to surgery and how long? XARELTO 3 DAYS PRIOR  5. Practice name and name of physician performing surgery? [place]; DR. O'TOOLE  6. What is your office phone number [phone]   7.   What is your office fax number [phone]  8.   Anesthesia type (None, local, MAC, general) ? NOT LISTED; SPINAL ?   Julaine Hua 12/21/2018, 4:28 PM  _________________________________________________________________   (provider comments below)

## 2018-12-22 NOTE — Telephone Encounter (Signed)
   Primary Cardiologist: Mertie Moores, MD  Chart reviewed as part of pre-operative protocol coverage. I spoke to patient and she is doing well without new cardiac concerns. No new palpitations suggestive of AF and no concerning sx for TIA/stroke (and no history of those). RCRI 0.4% indicating low risk of CV complications from procedure. Based on ACC/AHA guidelines, Sydney Wolfe would be at acceptable risk for the planned procedure without further cardiovascular testing. Case reviewed with pharmD - per office protocol, patient can hold Xarelto for 3 days prior to procedure. We typically advise that blood thinners be resumed when felt safe by performing physician.  I will route this recommendation to the requesting party via Epic fax function and remove from pre-op pool.  Please call with questions.  Charlie Pitter, PA-C 12/22/2018, 10:04 AM

## 2018-12-22 NOTE — Telephone Encounter (Signed)
Patient with diagnosis of afib on Xarelto for anticoagulation.    Procedure: LEFT L3-4, L4-5, TFE SI Date of procedure: TBD  CHADS2-VASc score of  4 (CHF, HTN, AGE, female)  CrCl 105 ml/min (actual BW)  Per office protocol, patient can hold Xarelto for 3 days prior to procedure.

## 2019-02-10 ENCOUNTER — Telehealth: Payer: Self-pay | Admitting: *Deleted

## 2019-02-10 NOTE — Telephone Encounter (Signed)
   [facility] Pre-operative Risk Assessment    Request for surgical clearance:  1. What type of surgery is being performed? RIGHT L4-S1 TRANS FORMAL ESI    2. When is this surgery scheduled? TBD    3. What type of clearance is required (medical clearance vs. Pharmacy clearance to hold med vs. Both)? BOTH  4. Are there any medications that need to be held prior to surgery and how long? XARELTO X 3 DAYS PRIOR   5. Practice name and name of physician performing surgery? [facility]; DR. DAVID O'TOOLE   6. What is your office phone number [phone]    7.   What is your office fax number [phone]  8.   Anesthesia type (None, local, MAC, general) ? NOT LISTED; LOCAL?    Sydney Wolfe 02/10/2019, 4:35 PM  _________________________________________________________________   (provider comments below)

## 2019-02-10 NOTE — Telephone Encounter (Signed)
Pt takes Xarelto for afib with CHADS2VASc score of 4 (age, sex, CHF, HTN). Renal function is normal. Ok to hold Xarelto for 3 days prior to spinal procedure.

## 2019-02-11 NOTE — Telephone Encounter (Signed)
   Primary Cardiologist: Mertie Moores, MD  Chart reviewed as part of pre-operative protocol coverage. Patient was contacted 02/11/2019 in reference to pre-operative risk assessment for pending surgery as outlined below.  Sydney Wolfe was last seen on 09/08/2018 by Dr. Acie Fredrickson by telemedicine.  Since that day, Sydney Wolfe has done well.  If left heart cath in 01/2019 that showed no CAD.  She is being treated for paroxysmal atrial fibrillation with no recent episodes of A. fib.  She is anticoagulated with Xarelto.  She is fairly active with no exertional chest discomfort or shortness of breath.  No lightheadedness or dizziness or perceived recurrences of A. Fib.  Therefore, based on ACC/AHA guidelines, the patient would be at acceptable risk for the planned procedure without further cardiovascular testing.   According to our pharmacy protocol: Pt takes Xarelto for afib with CHADS2VASc score of 4 (age, sex, CHF, HTN). Renal function is normal. Ok to hold Xarelto for 3 days prior to spinal procedure.  I will route this recommendation to the requesting party via Epic fax function and remove from pre-op pool.  Please call with questions.  Daune Perch, NP 02/11/2019, 3:32 PM

## 2019-03-08 ENCOUNTER — Other Ambulatory Visit: Payer: Medicare Other | Admitting: *Deleted

## 2019-03-08 ENCOUNTER — Other Ambulatory Visit: Payer: Self-pay

## 2019-03-08 DIAGNOSIS — I480 Paroxysmal atrial fibrillation: Secondary | ICD-10-CM

## 2019-03-08 DIAGNOSIS — E782 Mixed hyperlipidemia: Secondary | ICD-10-CM

## 2019-03-08 DIAGNOSIS — I10 Essential (primary) hypertension: Secondary | ICD-10-CM

## 2019-03-08 LAB — CBC
Hematocrit: 43.1 % (ref 34.0–46.6)
Hemoglobin: 14.6 g/dL (ref 11.1–15.9)
MCH: 33.2 pg — ABNORMAL HIGH (ref 26.6–33.0)
MCHC: 33.9 g/dL (ref 31.5–35.7)
MCV: 98 fL — ABNORMAL HIGH (ref 79–97)
Platelets: 188 10*3/uL (ref 150–450)
RBC: 4.4 x10E6/uL (ref 3.77–5.28)
RDW: 11.9 % (ref 11.7–15.4)
WBC: 10.6 10*3/uL (ref 3.4–10.8)

## 2019-03-08 LAB — BASIC METABOLIC PANEL
BUN/Creatinine Ratio: 17 (ref 12–28)
BUN: 15 mg/dL (ref 8–27)
CO2: 24 mmol/L (ref 20–29)
Calcium: 9.5 mg/dL (ref 8.7–10.3)
Chloride: 101 mmol/L (ref 96–106)
Creatinine, Ser: 0.88 mg/dL (ref 0.57–1.00)
GFR calc Af Amer: 78 mL/min/{1.73_m2} (ref >59)
GFR calc non Af Amer: 67 mL/min/{1.73_m2} (ref >59)
Glucose: 90 mg/dL (ref 65–99)
Potassium: 4.5 mmol/L (ref 3.5–5.2)
Sodium: 138 mmol/L (ref 134–144)

## 2019-03-08 LAB — HEPATIC FUNCTION PANEL
ALT: 21 IU/L (ref 0–32)
AST: 25 IU/L (ref 0–40)
Albumin: 4.3 g/dL (ref 3.8–4.8)
Alkaline Phosphatase: 74 IU/L (ref 39–117)
Bilirubin Total: 0.5 mg/dL (ref 0.0–1.2)
Bilirubin, Direct: 0.15 mg/dL (ref 0.00–0.40)
Total Protein: 6.7 g/dL (ref 6.0–8.5)

## 2019-03-08 LAB — LIPID PANEL
Chol/HDL Ratio: 3.2 ratio (ref 0.0–4.4)
Cholesterol, Total: 160 mg/dL (ref 100–199)
HDL: 50 mg/dL (ref >39)
LDL Chol Calc (NIH): 86 mg/dL (ref 0–99)
Triglycerides: 137 mg/dL (ref 0–149)
VLDL Cholesterol Cal: 24 mg/dL (ref 5–40)

## 2019-03-12 ENCOUNTER — Encounter: Payer: Self-pay | Admitting: Cardiovascular Disease

## 2019-03-12 ENCOUNTER — Other Ambulatory Visit: Payer: Self-pay

## 2019-03-12 ENCOUNTER — Ambulatory Visit (INDEPENDENT_AMBULATORY_CARE_PROVIDER_SITE_OTHER): Payer: Medicare Other | Admitting: Cardiovascular Disease

## 2019-03-12 VITALS — BP 120/72 | HR 69 | Ht 70.0 in | Wt 200.0 lb

## 2019-03-12 DIAGNOSIS — I480 Paroxysmal atrial fibrillation: Secondary | ICD-10-CM

## 2019-03-12 DIAGNOSIS — E782 Mixed hyperlipidemia: Secondary | ICD-10-CM | POA: Diagnosis not present

## 2019-03-12 DIAGNOSIS — I10 Essential (primary) hypertension: Secondary | ICD-10-CM

## 2019-03-12 NOTE — Patient Instructions (Signed)
Medication Instructions:  Your physician recommends that you continue on your current medications as directed. Please refer to the Current Medication list given to you today.  *If you need a refill on your cardiac medications before your next appointment, please call your pharmacy*  Lab Work: Your physician recommends that you return for a FASTING lipid profile, basic metabolic panel, and liver function panel in 1 YEAR, prior to appointment  If you have labs (blood work) drawn today and your tests are completely normal, you will receive your results only by: [name] MyChart Message (if you have MyChart) OR . A paper copy in the mail If you have any lab test that is abnormal or we need to change your treatment, we will call you to review the results.  Testing/Procedures: None ordered  Follow-Up: At [facility], you and your health needs are our priority.  As part of our continuing mission to provide you with exceptional heart care, we have created designated Provider Care Teams.  These Care Teams include your primary Cardiologist (physician) and Advanced Practice Providers (APPs -  Physician Assistants and Nurse Practitioners) who all work together to provide you with the care you need, when you need it.  Your next appointment:   12 months  The format for your next appointment:   In Person  Provider:   You may see Mertie Moores, MD or one of the following Advanced Practice Providers on your designated Care Team:    Richardson Dopp, PA-C  [place], [place]  Daune Perch, [place]

## 2019-03-12 NOTE — Progress Notes (Signed)
Sydney Wolfe Date of Birth  1950-11-05 [facility]     [place] Office  [address]    [address]   [address] [place], [place]  [address]    [place], [place]  [id number] [phone]  Fax  [phone]  [phone]  Fax [phone]  Problems: 1. Hypertension 2. Hyperlipidemia 3. Mild obesity. 4.  [name] 5. Rheumatoid arthritis  6. Hypothyroidism   Sydney Wolfe is a 68 y.o. female with the above noted hx.  She has done very well. She has not had any episodes of chest pain or shortness breath. She has been exercising on regular basis.  November 17, 2012:  She is tolerating the coreg well but has noticed some photosensitivity ( listed by epocrates also as a side effect)  Nov. 20, 2014:  Sydney Wolfe was admitted to the hosptial with CP several weeks ago.  She rule out of MI,  MRI of the heart was normal.  Stress myoview 04/13/13 was normal.   She had continued to have fatigue, variable BP.   She also has dyspnea.  Her 2 sisters have cardiac issues [name](Anne Garris, LInda Reavis)   Oct 04, 2013:  Sydney Wolfe is doing well.  Exercising reguarly.   She is tolerating the coreg.    December 15, 2014:  December 14, 2015:  Doing well from a cardiac standpoint She's been having more problems with her rheumatoid arthritis. She's now on Remicade infusions and takes naproxen on a regular basis. She's been getting exercise on a regular basis. She rides her stationary bike 4 times a week.  She has some shortness of breath and chest tightness with exertion -particularly when she climbs stairs. Like a weight on her chest ,  Pressure like sensation .  Resolves after she stops for about 30 seconds.    Oct. 24, 2017:  Was hospitalized in Sept. with pericarditis and atrial fib .   Was likely due to Rheumatoid arthritis .  She was originally hospitalized [facility] in mid September with chest pain. Her cardiac enzymes were normal. Cardiac  cath revealed smooth and normal coronary arteries.  She  was discharged but then when she saw her medical doctor she was readmitted to [facility] and was found at pericarditis and atrial fibrillation.   She is slowly regaining her strength. She still has an unusual sensation in her chest. The pain is not nearly as bad as it was a month ago but she is still not quite back to her normal self.   Jan. 24, 2018: Sydney Wolfe is seen today .  Has remained in NSR .  Has some chronic dyspnea,  Se[facility]ert at [facility] .   Aug. 17, 2018:    Doing well from a cardiac standpoint. Had back surgery in March ( C4,5,6)   - did well   September 15, 2017:  Pt was seen last year following an episode of pericarditis  associates with atrial fib ( was seen by Vilinda BoehringerGary Renaldo [place], 2017  [place]).  Pericarditis was likely due to Rheumatoid arthritis .   Converted on flecainide .  Flecainide was stopped.  She had a cath during that admission - normal cors  No palpitations to suggest atrial fib .  She had a lightheaded episode while walking several weeks . She was out walking back from the dumpster.   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  Exercises also  Recent labs show that her total cholesterol  is 150.  The LDL is 67.  Triglyceride level is 178.  We discussed increasing her exercise and working harder on her diet and exercise and weight loss program.   March 12, 2019:  Sydney Wolfe is seen today for follow-up of her episode of pericarditis associated with atrial fibrillation.  This occurred in 2017.  She was seen by Vilinda Boehringer in the [facility].  The pericarditis seem to be related to the rheumatoid arthritis.  She converted on flecainide and the flecainide was later stopped.  She had a heart catheterization during that admission which revealed normal coronary arteries.  No palpitations,  Does some exercise.  Walks 2-2.5 miles a day  Works Systems developer ( Loss adjuster, chartered at [employer])  Continues to have back issues Has lost 15-20 lbs since last  visit ,    Current Outpatient Medications on File Prior to Visit  Medication Sig Dispense Refill  . Abatacept (ORENCIA IV) Inject 1 Dose into the vein every 30 (thirty) days.    [name] BETIMOL 0.5 % ophthalmic solution Place 1 drop into both eyes 2 (two) times daily. In the evening put drop in Left eye only.    . celecoxib (CELEBREX) 200 MG capsule Take 200 mg by mouth 2 (two) times daily.     . Cholecalciferol (VITAMIN D) 2000 units tablet Take 2,000 Units by mouth daily.    . folic acid (FOLVITE) 1 MG tablet Take 1 mg by mouth daily.    [name] levothyroxine (SYNTHROID, LEVOTHROID) 50 MCG tablet Take 50 mcg by mouth daily before breakfast.   5  . meloxicam (MOBIC) 15 MG tablet Take 15 mg by mouth as needed for pain.    . metoprolol tartrate (LOPRESSOR) 25 MG tablet Take 1 tablet (25 mg total) by mouth 2 (two) times daily. 180 tablet 3  . nitroGLYCERIN (NITROSTAT) 0.4 MG SL tablet Place 1 tablet (0.4 mg total) under the tongue every 5 (five) minutes as needed for chest pain. 25 tablet 3  . predniSONE (DELTASONE) 5 MG tablet Take 5 mg by mouth daily with breakfast. Tapering dose    . rivaroxaban (XARELTO) 20 MG TABS tablet Take 1 tablet (20 mg total) by mouth daily with supper. 30 tablet 3  . rosuvastatin (CRESTOR) 5 MG tablet TAKE 1 TABLET BY MOUTH EVERY OTHER DAY 45 tablet 2  . sulfaSALAzine (AZULFIDINE) 500 MG tablet Take 1 tablet by mouth daily.     No current facility-administered medications on file prior to visit.     No Known Allergies  Past Medical History:  Diagnosis Date  . Arthritis   . Dyspnea    with exertion    . Gout   . Heart murmur   . 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 Hot flashes   . Hyperlipidemia   . Hypertension   . Hypothyroidism   . Normal nuclear stress test 2004  . OA (osteoarthritis)   . Obesity   . PAF (paroxysmal atrial fibrillation) (HCC) 01/2016  . Pericarditis   . [name] (right bundle branch block)     Past  Surgical History:  Procedure Laterality Date  . ANTERIOR CERVICAL DECOMP/DISCECTOMY FUSION N/A 08/06/2016   Procedure: Cervical four-Cervical seven  Anterior cervical discectomy with fusion and plate fixation;  Surgeon: Loura Halt Ditty, MD;  Location: [facility];  Service: Neurosurgery;  Laterality: N/A;  . CARDIAC CATHETERIZATION N/A 02/09/2016   Procedure: Left Heart Cath and Coronary Angiography;  Surgeon: Renelda Loma  Hassell Done, MD;  Location: [place] CV LAB;  Service: Cardiovascular;  Laterality: N/A;  . CARDIOVASCULAR STRESS TEST  08/20/2002   EF 76%  . CARPAL TUNNEL RELEASE    . CHOLECYSTECTOMY  1987  . DILATION AND CURETTAGE OF UTERUS  multiple  . HAND SURGERY  1987 and 1988   bilateral  . SPINE SURGERY  08/2008   T2  . TUBAL LIGATION  1975  . US ECHOCARDIOGRAPHY  07/25/2004   EF 55-60%    Social History   Tobacco Use  Smoking Status Never Smoker  Smokeless Tobacco Never Used    Social History   Substance and Sexual Activity  Alcohol Use No    Family History  Problem Relation Age of Onset  . Heart failure Mother   . Rheum arthritis Mother   . Hypertension Father   . Stroke Father   . Diabetes Father   . Rheum arthritis Father     Reviw of Systems:  Noted in current history.  Physical Exam: Blood pressure 120/72, pulse 69, height 5\' 10"  (1.778 m), weight 200 lb (90.7 kg), SpO2 95 %.  GEN: Middle-aged female, no acute distress HEENT: Normal NECK: No JVD; No carotid bruits LYMPHATICS: No lymphadenopathy CARDIAC: RRR , no murmurs, rubs, gallops RESPIRATORY:  Clear to auscultation without rales, wheezing or rhonchi  ABDOMEN: Soft, non-tender, non-distended MUSCULOSKELETAL:  No edema; No deformity  SKIN: Warm and dry NEUROLOGIC:  Alert and oriented x 3  ECG: March 12, 2019: Normal sinus rhythm at 69 beats minute.  Right bundle branch block.  Abnormal EKG. Assessment / Plan:   1.   PAF -she is not had any recurrent episodes of paroxysmal atrial  fibrillation.  Continue Xarelto.  Her heart rate is well controlled.    2. Actue pericarditis .  Continue treatment for her rheumatoid arthritis.  She is not had any recurrent pericarditis.  3. Hyperlipidemia:   Recent lipid levels look great.  Continue current dose of Crestor.  4. Mild obesity.  She is done well with a diet, exercise, weight loss program.  She is lost 15 to 20 pounds since I last saw her.  I encouraged her to continue with a good diet.  She exercises on a regular basis.    Mertie Moores, MD  03/12/2019 9:50 AM    [place],  [place], [place]  [address] Pager [phone] Phone: [phone]; Fax: [phone]

## 2019-03-19 ENCOUNTER — Other Ambulatory Visit: Payer: Self-pay | Admitting: Cardiovascular Disease

## 2019-03-19 NOTE — Telephone Encounter (Signed)
Prescription refill request for Xarelto received.   Last office visit: Nahser (03-12-2019) Weight: 90.7 kg Age: 68 y.o. Scr: 0.88 (03-08-2019) CrCl: 86 ml/min  Prescription refill sent.

## 2019-05-05 ENCOUNTER — Telehealth: Payer: Self-pay | Admitting: *Deleted

## 2019-05-05 NOTE — Telephone Encounter (Signed)
   [facility] Pre-operative Risk Assessment    Request for surgical clearance:  1. What type of surgery is being performed? LEFT L3-L5 TFESI   2. When is this surgery scheduled? TBD   3. What type of clearance is required (medical clearance vs. Pharmacy clearance to hold med vs. Both)? BOTH  4. Are there any medications that need to be held prior to surgery and how long? XARELTO X 3 DAYS PRIOR TO INJECTION   5. Practice name and name of physician performing surgery? [facility]; DR. DAVID O'TOOLE   6. What is your office phone number [phone]    7.   What is your office fax number [phone]  8.   Anesthesia type (None, local, MAC, general) ? NONE LISTED   Sydney Wolfe 05/05/2019, 2:49 PM  _________________________________________________________________   (provider comments below)

## 2019-05-05 NOTE — Telephone Encounter (Signed)
Pharmacy please comment on holding Xarelto in this patient.  Kerin Ransom PA-C 05/05/2019 3:35 PM

## 2019-05-05 NOTE — Telephone Encounter (Signed)
   Primary Cardiologist: Mertie Moores, MD  Chart reviewed and patient contacted today as part of pre-operative protocol coverage. Given past medical history and time since last visit, based on ACC/AHA guidelines, OLISA QUESNEL would be at acceptable risk for the planned procedure without further cardiovascular testing.   OK to hold Eliquis 3 days pre op if needed  I will route this recommendation to the requesting party via [place] fax function and remove from pre-op pool.  Please call with questions.  Kerin Ransom, PA-C 05/05/2019, 4:12 PM

## 2019-05-05 NOTE — Telephone Encounter (Signed)
Patient with diagnosis of atrial fibrillation on Xarelto for anticoagulation.    Procedure: Left L3-L5 TFESI Date of procedure: TBD  CHADS2-VASc score of  4 (CHF, HTN, AGE, female)  CrCl 86.4 Platelet count 188  Per office protocol, patient can hold Xarelto for 3 days prior to procedure.    Patient will not need bridging with Lovenox (enoxaparin) around procedure.

## 2019-05-06 NOTE — Telephone Encounter (Signed)
I spoke with Whitney at [facility]- clearance faxed again- [place] PA-C 05/06/2019 11:42 AM

## 2019-06-17 ENCOUNTER — Telehealth: Payer: Self-pay | Admitting: *Deleted

## 2019-06-17 NOTE — Telephone Encounter (Signed)
Patient with diagnosis of afib on Xarelto for anticoagulation.    Procedure: RIGHT L4-S1 TFESI  Date of procedure: 07/20/2019  CHADS2-VASc score of  4 (CHF, HTN, AGE, female)  CrCl 73 ml/min  Per office protocol, patient can hold Xarelto for 3 days prior to procedure.

## 2019-06-17 NOTE — Telephone Encounter (Signed)
   [facility] Pre-operative Risk Assessment    Request for surgical clearance:  1. What type of surgery is being performed? RIGHT L4-S1 TFESI   2. When is this surgery scheduled? 07/20/19   3. What type of clearance is required (medical clearance vs. Pharmacy clearance to hold med vs. Both)? PHARM  4. Are there any medications that need to be held prior to surgery and how long? XARELTO 3 DAYS PRIOR TO INJECTION  5. Practice name and name of physician performing surgery? [facility]; DR. DAVID O'TOOLE   6. What is your office phone number [phone]   7.   What is your office fax number [phone]  8.   Anesthesia type (None, local, MAC, general) ? NONE LISTED   Sydney Wolfe 06/17/2019, 2:58 PM  _________________________________________________________________   (provider comments below)

## 2019-08-02 ENCOUNTER — Other Ambulatory Visit: Payer: Self-pay

## 2019-08-02 MED ORDER — ROSUVASTATIN CALCIUM 5 MG PO TABS: 5.0000 mg | ORAL_TABLET | ORAL | 1 refills | Status: DC

## 2019-09-17 ENCOUNTER — Other Ambulatory Visit: Payer: Self-pay | Admitting: Cardiovascular Disease

## 2019-09-17 NOTE — Telephone Encounter (Signed)
Prescription refill request for Xarelto received.   Last office visit: 03/12/2019, Nahser Weight: 90.7kg Age: 69 y.o. Scr: 0.88, 03/08/2019 CrCl:86 ml/min    Prescription refill sent.

## 2019-10-29 ENCOUNTER — Telehealth: Payer: Self-pay

## 2019-10-29 NOTE — Telephone Encounter (Signed)
I have informed the patient our pharmacist's recommendation

## 2019-10-29 NOTE — Telephone Encounter (Signed)
   [facility] Pre-operative Risk Assessment    HEARTCARE STAFF: - Please ensure there is not already an duplicate clearance open for this procedure. - Under Visit Info/Reason for Call, type in Other and utilize the format Clearance MM/DD/YY or Clearance TBD. Do not use dashes or single digits. - If request is for dental extraction, please clarify the # of teeth to be extracted.  Request for surgical clearance:  1. What type of surgery is being performed? LUMBAR TRANSFORAMINAL   2. When is this surgery scheduled? 11/12/19   3. What type of clearance is required (medical clearance vs. Pharmacy clearance to hold med vs. Both)? PHARAMCY  4. Are there any medications that need to be held prior to surgery and how long?XARELTO X 3DAYS   5. Practice name and name of physician performing surgery? [place]; DR. DAVID O'TOOLE  6. What is the office phone number? [phone]   7.   What is the office fax number? [phone]  8.   Anesthesia type (None, local, MAC, general) ? NONE LISTED   Jacinta Shoe 10/29/2019, 12:30 PM  _________________________________________________________________   (provider comments below)

## 2019-10-29 NOTE — Telephone Encounter (Signed)
Patient with diagnosis of Afib on Xarelto for anticoagulation.    Procedure: LUMBAR TRANSFORAMINAL  Date of procedure: 11/12/19   CHADS2-VASc score of 4 (CHF, HTN, AGE, & female)  CrCl 74 mL/min Platelet count 188k  Per office protocol, patient can hold Xarelto for 3 days prior to procedure.    Domenic Moras, PharmD PGY1 Ambulatory Care Pharmacy Resident

## 2019-12-03 ENCOUNTER — Other Ambulatory Visit: Payer: Self-pay | Admitting: Cardiovascular Disease

## 2020-02-29 ENCOUNTER — Other Ambulatory Visit: Payer: Self-pay | Admitting: Cardiovascular Disease

## 2020-02-29 NOTE — Telephone Encounter (Signed)
Prescription refill request for Xarelto received.   Last office visit: Nahser, 03/12/2019 Weight: 90.7 kg  Age: 69 y.o. Scr: 0.88, 03/08/2019 CrCl: 85 ml/min   Prescription refill sent.

## 2020-03-21 NOTE — Progress Notes (Signed)
Sydney Wolfe Date of Birth  [birth year] [facility]     [place] Office  [address]    [address]   [address] [place], [place]  [address]    [place], [place]  [address] [phone]  Fax  [phone]  [phone]  Fax [phone]  Problems: 1. Hypertension 2. Hyperlipidemia 3. Mild obesity. 4.  [name] 5. Rheumatoid arthritis  6. Hypothyroidism   Sydney Wolfe is a 69 y.o. female with the above noted hx.  She has done very well. She has not had any episodes of chest pain or shortness breath. She has been exercising on regular basis.  November 17, 2012:  She is tolerating the coreg well but has noticed some photosensitivity ( listed by epocrates also as a side effect)  Nov. 20, 2014:  Sydney Wolfe was admitted to the hosptial with CP several weeks ago.  She rule out of MI,  MRI of the heart was normal.  Stress myoview 04/13/13 was normal.   She had continued to have fatigue, variable BP.   She also has dyspnea.  Her 2 sisters have cardiac issues [name] Reavis)   Oct 04, 2013:  Sydney Wolfe is doing well.  Exercising reguarly.   She is tolerating the coreg.    December 15, 2014:  December 14, 2015:  Doing well from a cardiac standpoint She's been having more problems with her rheumatoid arthritis. She's now on Remicade infusions and takes naproxen on a regular basis. She's been getting exercise on a regular basis. She rides her stationary bike 4 times a week.  She has some shortness of breath and chest tightness with exertion -particularly when she climbs stairs. Like a weight on her chest ,  Pressure like sensation .  Resolves after she stops for about 30 seconds.    Oct. 24, 2017:  Was hospitalized in Sept. with pericarditis and atrial fib .   Was likely due to Rheumatoid arthritis .  She was originally hospitalized at [facility] in mid September with chest pain. Her cardiac enzymes were normal. Cardiac  cath revealed smooth and normal coronary arteries.  She  was discharged but then when she saw her medical doctor she was readmitted to [facility] and was found at pericarditis and atrial fibrillation.   She is slowly regaining her strength. She still has an unusual sensation in her chest. The pain is not nearly as bad as it was a month ago but she is still not quite back to her normal self.   Jan. 24, 2018: Sydney Wolfe is seen today .  Has remained in NSR .  Has some chronic dyspnea,  Sees Dr. Sherene Sires at [facility] .   Aug. 17, 2018:    Doing well from a cardiac standpoint. Had back surgery in March ( C4,5,6)   - did well   September 15, 2017:  Pt was seen last year following an episode of pericarditis  associates with atrial fib ( was seen by Sydney Wolfe Sept, 2017  at [place] [facility]).  Pericarditis was likely due to Rheumatoid arthritis .   Converted on flecainide .  Flecainide was stopped.  She had a cath during that admission - normal cors  No palpitations to suggest atrial fib .  She had a lightheaded episode while walking several weeks . She was out walking back from the dumpster.   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  Exercises also  Recent labs show that her total cholesterol  is 150.  The LDL is 67.  Triglyceride level is 178.  We discussed increasing her exercise and working harder on her diet and exercise and weight loss program.   March 12, 2019:  Sydney Wolfe is seen today for follow-up of her episode of pericarditis associated with atrial fibrillation.  This occurred in 2017.  She was seen by Sydney Wolfe in the [facility].  The pericarditis seem to be related to the rheumatoid arthritis.  She converted on flecainide and the flecainide was later stopped.  She had a heart catheterization during that admission which revealed normal coronary arteries.  No palpitations,  Does some exercise.  Walks 2-2.5 miles a day  Works Systems developer ( Loss adjuster, chartered at [employer])  Continues to have back issues Has lost 15-20 lbs since last  visit ,     Oct. 27, 2021: Sydney Wolfe is seen today for follow up of her pericarditis with associated atrial fib.  The pericarditis was likely due to her RA.  She converted with Flecainide. Heart cath revealed normal cors.   She is noticed a little bit of incidental bruising on her arms.  We discussed possibly changing the Xarelto to Eliquis.  It sounds like for now she would like to stay with Xarelto.   Current Outpatient Medications on File Prior to Visit  Medication Sig Dispense Refill  . Abatacept (ORENCIA IV) Inject 1 Dose into the vein every 30 (thirty) days.    . celecoxib (CELEBREX) 100 MG capsule Take 1 capsule by mouth 2 (two) times daily.    . Cholecalciferol (VITAMIN D) 2000 units tablet Take 2,000 Units by mouth daily.    [name] dicyclomine (BENTYL) 20 MG tablet Take 20 mg by mouth 4 (four) times daily.    . dorzolamide-timolol (COSOPT) 22.3-6.8 MG/ML ophthalmic solution Place 1 drop into both eyes 2 (two) times daily.    . folic acid (FOLVITE) 1 MG tablet Take 1 mg by mouth daily.    [name] levothyroxine (SYNTHROID, LEVOTHROID) 50 MCG tablet Take 50 mcg by mouth daily before breakfast.   5  . nitroGLYCERIN (NITROSTAT) 0.4 MG SL tablet Place 1 tablet (0.4 mg total) under the tongue every 5 (five) minutes as needed for chest pain. 25 tablet 3  . predniSONE (DELTASONE) 5 MG tablet Take 5 mg by mouth daily with breakfast. Tapering dose    . rosuvastatin (CRESTOR) 5 MG tablet Take 1 tablet (5 mg total) by mouth every other day. 90 tablet 1  . sulfaSALAzine (AZULFIDINE) 500 MG tablet Take 1 tablet by mouth daily.    . traMADol (ULTRAM) 50 MG tablet Take 50 mg by mouth 3 (three) times daily as needed.    Sydney Wolfe 20 MG TABS tablet TAKE 1 TABLET BY MOUTH EVERY DAY WITH SUPPER 90 tablet 1   No current facility-administered medications on file prior to visit.    No Known Allergies  Past Medical History:  Diagnosis Date  . Arthritis   . Dyspnea    with exertion    . Gout   . Heart murmur   .  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 Hot flashes   . Hyperlipidemia   . Hypertension   . Hypothyroidism   . Normal nuclear stress test 2004  . OA (osteoarthritis)   . Obesity   . PAF (paroxysmal atrial fibrillation) (HCC) 01/2016  . Pericarditis   . [name] (right bundle branch block)     Past Surgical  History:  Procedure Laterality Date  . ANTERIOR CERVICAL DECOMP/DISCECTOMY FUSION N/A 08/06/2016   Procedure: Cervical four-Cervical seven  Anterior cervical discectomy with fusion and plate fixation;  Surgeon: Loura Halt Ditty, MD;  Location: [facility] OR;  Service: Neurosurgery;  Laterality: N/A;  . CARDIAC CATHETERIZATION N/A 02/09/2016   Procedure: Left Heart Cath and Coronary Angiography;  Surgeon: Corky Crafts, MD;  Location: [facility] INV[facility] LAB;  Service: Cardiovascular;  Laterality: N/A;  . CARDIOVASCULAR STRESS TEST  08/20/2002   EF 76%  . CARPAL TUNNEL RELEASE    . CHOLECYSTECTOMY  1987  . DILATION AND CURETTAGE OF UTERUS  multiple  . HAND SURGERY  1987 and 1988   bilateral  . SPINE SURGERY  08/2008   T2  . TUBAL LIGATION  1975  . US ECHOCARDIOGRAPHY  07/25/2004   EF 55-60%    Social History   Tobacco Use  Smoking Status Never Smoker  Smokeless Tobacco Never Used    Social History   Substance and Sexual Activity  Alcohol Use No    Family History  Problem Relation Age of Onset  . Heart failure Mother   . Rheum arthritis Mother   . Hypertension Father   . Stroke Father   . Diabetes Father   . Rheum arthritis Father     Reviw of Systems:  Noted in current history.  Physical Exam: Blood pressure 134/88, pulse 70, height 5\' 10"  (1.778 m), weight 206 lb 12.8 oz (93.8 kg), SpO2 96 %.  GEN:  Well nourished, well developed in no acute distress HEENT: Normal NECK: No JVD; No carotid bruits LYMPHATICS: No lymphadenopathy CARDIAC: RRR , no murmurs, rubs, gallops RESPIRATORY:  Clear to auscultation without rales, wheezing  or rhonchi  ABDOMEN: Soft, non-tender, non-distended MUSCULOSKELETAL:  No edema; No deformity  SKIN: Warm and dry NEUROLOGIC:  Alert and oriented x 3   ECG: March 22, 2020: Normal sinus rhythm at 70.  Right bundle branch block.  No changes from previous EKG.   Assessment / Plan:   1.   PAF -she remains in normal sinus rhythm.  Continue Xarelto.  She is having some bruising of her arms.  She attributes this to her new puppy.  We discussed possibly changing her to Eliquis which might reduce the risk of bruising.  She would rather just continue with Xarelto for now.     2. Actue pericarditis .  She is not had any recurrent episodes of pericarditis.   3. Hyperlipidemia: Lipids were checked last year and have been well controlled.  We will redraw lipids, liver enzymes, basic metabolic profile.   4. Mild obesity.  Continued to encourage exercise. [name]    [name], MD  03/22/2020 9:52 AM    [facility] [address] [place],  [address] [place], [place]  [address] Pager [phone] Phone: [phone]; Fax: [phone]

## 2020-03-22 ENCOUNTER — Other Ambulatory Visit: Payer: Self-pay

## 2020-03-22 ENCOUNTER — Other Ambulatory Visit: Payer: Self-pay | Admitting: *Deleted

## 2020-03-22 ENCOUNTER — Ambulatory Visit (INDEPENDENT_AMBULATORY_CARE_PROVIDER_SITE_OTHER): Payer: Medicare Other | Admitting: Cardiovascular Disease

## 2020-03-22 ENCOUNTER — Encounter: Payer: Self-pay | Admitting: Cardiovascular Disease

## 2020-03-22 VITALS — BP 134/88 | HR 70 | Ht 70.0 in | Wt 206.8 lb

## 2020-03-22 DIAGNOSIS — I10 Essential (primary) hypertension: Secondary | ICD-10-CM

## 2020-03-22 DIAGNOSIS — I4510 Unspecified right bundle-branch block: Secondary | ICD-10-CM

## 2020-03-22 DIAGNOSIS — I480 Paroxysmal atrial fibrillation: Secondary | ICD-10-CM

## 2020-03-22 DIAGNOSIS — E782 Mixed hyperlipidemia: Secondary | ICD-10-CM

## 2020-03-22 LAB — HEPATIC FUNCTION PANEL
ALT: 14 IU/L (ref 0–32)
AST: 21 IU/L (ref 0–40)
Albumin: 4.4 g/dL (ref 3.8–4.8)
Alkaline Phosphatase: 70 IU/L (ref 44–121)
Bilirubin Total: 0.3 mg/dL (ref 0.0–1.2)
Bilirubin, Direct: 0.12 mg/dL (ref 0.00–0.40)
Total Protein: 6.8 g/dL (ref 6.0–8.5)

## 2020-03-22 LAB — BASIC METABOLIC PANEL
BUN/Creatinine Ratio: 17 (ref 12–28)
BUN: 12 mg/dL (ref 8–27)
CO2: 22 mmol/L (ref 20–29)
Calcium: 9.2 mg/dL (ref 8.7–10.3)
Chloride: 104 mmol/L (ref 96–106)
Creatinine, Ser: 0.72 mg/dL (ref 0.57–1.00)
GFR calc Af Amer: 98 mL/min/{1.73_m2} (ref >59)
GFR calc non Af Amer: 85 mL/min/{1.73_m2} (ref >59)
Glucose: 95 mg/dL (ref 65–99)
Potassium: 4.2 mmol/L (ref 3.5–5.2)
Sodium: 140 mmol/L (ref 134–144)

## 2020-03-22 LAB — LIPID PANEL
Chol/HDL Ratio: 2.8 ratio (ref 0.0–4.4)
Cholesterol, Total: 148 mg/dL (ref 100–199)
HDL: 53 mg/dL (ref >39)
LDL Chol Calc (NIH): 67 mg/dL (ref 0–99)
Triglycerides: 163 mg/dL — ABNORMAL HIGH (ref 0–149)
VLDL Cholesterol Cal: 28 mg/dL (ref 5–40)

## 2020-03-22 MED ORDER — METOPROLOL TARTRATE 25 MG PO TABS
25.0000 mg | ORAL_TABLET | Freq: Two times a day (BID) | ORAL | 3 refills | Status: DC
Start: 2020-03-22 — End: 2021-05-24

## 2020-03-22 NOTE — Patient Instructions (Signed)
Medication Instructions:  NO CHANGES *If you need a refill on your cardiac medications before your next appointment, please call your pharmacy*   Lab Work: TODAY BMET LIPID AND LIVER If you have labs (blood work) drawn today and your tests are completely normal, you will receive your results only by: . MyChart Message (if you have MyChart) OR . A paper copy in the mail If you have any lab test that is abnormal or we need to change your treatment, we will call you to review the results.   Testing/Procedures: NONE   Follow-Up: At [facility], you and your health needs are our priority.  As part of our continuing mission to provide you with exceptional heart care, we have created designated Provider Care Teams.  These Care Teams include your primary Cardiologist (physician) and Advanced Practice Providers (APPs -  Physician Assistants and Nurse Practitioners) who all work together to provide you with the care you need, when you need it.  We recommend signing up for the patient portal called "MyChart".  Sign up information is provided on this After Visit Summary.  MyChart is used to connect with patients for Virtual Visits (Telemedicine).  Patients are able to view lab/test results, encounter notes, upcoming appointments, etc.  Non-urgent messages can be sent to your provider as well.   To learn more about what you can do with MyChart, go to [email or web address].    Your next appointment:   1 year(s)  The format for your next appointment:   In Person  Provider:   Philip Nahser, MD   Other Instructions   

## 2020-08-26 ENCOUNTER — Other Ambulatory Visit: Payer: Self-pay | Admitting: Cardiovascular Disease

## 2020-08-28 NOTE — Telephone Encounter (Signed)
Pt's age 70, wt 93.8 kg, SCr 0.72, CrCl 107.66, last ov w/ PN 03/22/20.

## 2020-09-11 ENCOUNTER — Other Ambulatory Visit: Payer: Self-pay | Admitting: Cardiovascular Disease

## 2021-02-23 ENCOUNTER — Other Ambulatory Visit: Payer: Self-pay | Admitting: Cardiovascular Disease

## 2021-02-23 NOTE — Telephone Encounter (Signed)
Prescription refill request for Xarelto received.   Indication: afib  Last office visit: Nahser, 03/22/2020 Weight: 93.8 kg  Age: 70 yo  Scr: 0.72, 03/22/2020 CrCl: 106 ml/min   Refill sent.

## 2021-03-10 ENCOUNTER — Other Ambulatory Visit: Payer: Self-pay | Admitting: Cardiovascular Disease

## 2021-03-12 ENCOUNTER — Other Ambulatory Visit: Payer: Self-pay

## 2021-03-12 MED ORDER — ROSUVASTATIN CALCIUM 5 MG PO TABS
5.0000 mg | ORAL_TABLET | ORAL | 0 refills | Status: DC
Start: 2021-03-12 — End: 2021-12-10

## 2021-04-09 ENCOUNTER — Encounter: Payer: Self-pay | Admitting: Cardiovascular Disease

## 2021-04-09 NOTE — Progress Notes (Signed)
Sydney Wolfe Date of Birth  [birth year] [facility]     [place] Office  [address]    [address]   [address] [place], [place]  [address]    [place], [place]  [address] [phone]  Fax  [phone]  [phone]  Fax [phone]  Problems: 1. Hypertension 2. Hyperlipidemia 3. Mild obesity. 4.  [name] 5. Rheumatoid arthritis  6. Hypothyroidism   Sydney Wolfe is a 70 y.o. female with the above noted hx.  She has done very well. She has not had any episodes of chest pain or shortness breath. She has been exercising on regular basis.  November 17, 2012:  She is tolerating the coreg well but has noticed some photosensitivity ( listed by epocrates also as a side effect)  Nov. 20, 2014:  Sydney Wolfe was admitted to the hosptial with CP several weeks ago.  She rule out of MI,  MRI of the heart was normal.  Stress myoview 04/13/13 was normal.   She had continued to have fatigue, variable BP.   She also has dyspnea.  Her 2 sisters have cardiac issues [name] Reavis)   Oct 04, 2013:  Sydney Wolfe is doing well.  Exercising reguarly.   She is tolerating the coreg.    December 15, 2014:  December 14, 2015:  Doing well from a cardiac standpoint She's been having more problems with her rheumatoid arthritis. She's now on Remicade infusions and takes naproxen on a regular basis. She's been getting exercise on a regular basis. She rides her stationary bike 4 times a week.  She has some shortness of breath and chest tightness with exertion -particularly when she climbs stairs. Like a weight on her chest ,  Pressure like sensation .  Resolves after she stops for about 30 seconds.    Oct. 24, 2017:  Was hospitalized in Sept. with pericarditis and atrial fib .   Was likely due to Rheumatoid arthritis .  She was originally hospitalized at [facility] in mid September with chest pain. Her cardiac enzymes were normal. Cardiac  cath revealed smooth and normal coronary arteries.  She  was discharged but then when she saw her medical doctor she was readmitted to [facility] and was found at pericarditis and atrial fibrillation.   She is slowly regaining her strength. She still has an unusual sensation in her chest. The pain is not nearly as bad as it was a month ago but she is still not quite back to her normal self.   Jan. 24, 2018: Sydney Wolfe is seen today .  Has remained in NSR .  Has some chronic dyspnea,  Sees Dr. Sherene Sires at [facility] .   Aug. 17, 2018:    Doing well from a cardiac standpoint. Had back surgery in March ( C4,5,6)   - did well   September 15, 2017:  Pt was seen last year following an episode of pericarditis  associates with atrial fib ( was seen by Vilinda Boehringer Sept, 2017  at [place]).  Pericarditis was likely due to Rheumatoid arthritis .   Converted on flecainide .  Flecainide was stopped.  She had a cath during that admission - normal cors  No palpitations to suggest atrial fib .  She had a lightheaded episode while walking several weeks . She was out walking back from the dumpster.   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  Exercises also  Recent labs show that her total cholesterol  is 150.  The LDL is 67.  Triglyceride level is 178.  We discussed increasing her exercise and working harder on her diet and exercise and weight loss program.   March 12, 2019:  Sydney Wolfe is seen today for follow-up of her episode of pericarditis associated with atrial fibrillation.  This occurred in 2017.  She was seen by Lamar Blinks in the [facility].  The pericarditis seem to be related to the rheumatoid arthritis.  She converted on flecainide and the flecainide was later stopped.  She had a heart catheterization during that admission which revealed normal coronary arteries.  No palpitations,  Does some exercise.  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 27, 2021: Sydney Wolfe is seen today for follow up of her pericarditis with associated atrial fib.  The pericarditis was likely due to her RA.  She converted with Flecainide. Heart cath revealed normal cors.   She is noticed a little bit of incidental bruising on her arms.  We discussed possibly changing the Xarelto to Eliquis.  It sounds like for now she would like to stay with Xarelto.  Nov. 14, 2022: Sydney Wolfe is seen today for follow-up of her paroxysmal atrial fibrillation.  She had a history of pericarditis that was likely due to her rheumatoid arthritis.  She converted with flecainide.  Heart catheterization at that time revealed normal coronary arteries. No CP or dyspnea.  Lots of back issues.  Is not a surgical candidate Looking to possibly start pain clinic  / back injections   She is at low risk for her upcoming back injections. She may hold her Xarelto for 3 days prior to her injections   Current Outpatient Medications on File Prior to Visit  Medication Sig Dispense Refill   Abatacept (ORENCIA IV) Inject 1 Dose into the vein every 30 (thirty) days.     celecoxib (CELEBREX) 100 MG capsule Take 1 capsule by mouth 2 (two) times daily.     Cholecalciferol (VITAMIN D) 2000 units tablet Take 2,000 Units by mouth daily.     dorzolamide-timolol (COSOPT) 22.3-6.8 MG/ML ophthalmic solution Place 1 drop into both eyes 2 (two) times daily.     folic acid (FOLVITE) 1 MG tablet Take 1 mg by mouth daily.     levothyroxine (SYNTHROID, LEVOTHROID) 50 MCG tablet Take 50 mcg by mouth daily before breakfast.   5   metoprolol tartrate (LOPRESSOR) 25 MG tablet Take 1 tablet (25 mg total) by mouth 2 (two) times daily. 180 tablet 3   nitroGLYCERIN (NITROSTAT) 0.4 MG SL tablet Place 1 tablet (0.4 mg total) under the tongue every 5 (five) minutes as needed for chest pain. 25 tablet 3   predniSONE (DELTASONE) 5 MG tablet Take 5 mg by mouth as needed. Tapering dose     rosuvastatin (CRESTOR) 5 MG  tablet Take 1 tablet (5 mg total) by mouth every other day. Please keep upcoming appt. With Dr. Acie Fredrickson in Nov. To receive further refills. Thank you 90 tablet 0   sulfaSALAzine (AZULFIDINE) 500 MG tablet Take 1 tablet by mouth daily.     traMADol (ULTRAM) 50 MG tablet Take 50 mg by mouth 3 (three) times daily as needed.     XARELTO 20 MG TABS tablet TAKE 1 TABLET BY MOUTH EVERY DAY WITH SUPPER 90 tablet 1   dicyclomine (BENTYL) 20 MG tablet Take  20 mg by mouth 4 (four) times daily. (Patient not taking: Reported on 04/10/2021)     No current facility-administered medications on file prior to visit.    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   Hot flashes    Hyperlipidemia    Hypertension    Hypothyroidism    Normal nuclear stress test 2004   OA (osteoarthritis)    Obesity    PAF (paroxysmal atrial fibrillation) ([place]) 01/2016   Pericarditis    [name] (right bundle branch block)     Past Surgical History:  Procedure Laterality Date   ANTERIOR CERVICAL DECOMP/DISCECTOMY FUSION N/A 08/06/2016   Procedure: Cervical four-Cervical seven  Anterior cervical discectomy with fusion and plate fixation;  Surgeon: Kevan Ny Ditty, MD;  Location: [place];  Service: Neurosurgery;  Laterality: N/A;   CARDIAC CATHETERIZATION N/A 02/09/2016   Procedure: Left Heart Cath and Coronary Angiography;  Surgeon: Jettie Booze, MD;  Location: [place] CV LAB;  Service: Cardiovascular;  Laterality: N/A;   CARDIOVASCULAR STRESS TEST  08/20/2002   EF 76%   CARPAL TUNNEL RELEASE     CHOLECYSTECTOMY  1987   DILATION AND CURETTAGE OF UTERUS  multiple    HAND SURGERY  1987 and 1988   bilateral   SPINE SURGERY  08/2008   T2   TUBAL LIGATION  1975   US ECHOCARDIOGRAPHY  07/25/2004   EF 55-60%    Social History   Tobacco Use  Smoking Status Never  Smokeless Tobacco Never    Social History   Substance and Sexual Activity  Alcohol Use No    Family History  Problem Relation Age of Onset   Heart failure Mother    Rheum arthritis Mother    Hypertension Father    Stroke Father    Diabetes Father    Rheum arthritis Father     Reviw of Systems:  Noted in current history.  Physical Exam: Blood pressure (!) 142/80, pulse 76, height 5\' 10"  (1.778 m), weight 210 lb 9.6 oz (95.5 kg), SpO2 97 %.  GEN:  Well nourished, well developed in no acute distress HEENT: Normal NECK: No JVD; No carotid bruits LYMPHATICS: No lymphadenopathy CARDIAC: RRR   RESPIRATORY:  Clear to auscultation without rales, wheezing or rhonchi  ABDOMEN: Soft, non-tender, non-distended MUSCULOSKELETAL:  No edema; No deformity  SKIN: Warm and dry NEUROLOGIC:  Alert and oriented x 3    ECG: April 10, 2021: Normal sinus rhythm 76.  Right bundle branch block.  No ST or T wave changes.   Assessment / Plan:   1.   PAF -she remains in normal sinus rhythm.  She is on Xarelto for anticoagulation.  She needs to have back injections for her severe arthritis.  She is at low risk for her upcoming back injections.  The clearance for the anticoagulant will be made by the pharmacist in Coumadin clinic but I suspect that they will allow her to hold her Xarelto for 3 days.  Lab return in 1  year to see me or an APP      2. Actue pericarditis .  No further signs or symptoms of pericarditis.    3. Hyperlipidemia: Managed by her primary medical doctor.   [name], MD  04/10/2021 9:58 AM    [facility] [address] [place],  [address] [place], [place]  [address] Pager [phone] Phone: [phone]; Fax: [phone]

## 2021-04-10 ENCOUNTER — Ambulatory Visit (INDEPENDENT_AMBULATORY_CARE_PROVIDER_SITE_OTHER): Payer: Medicare Other | Admitting: Cardiovascular Disease

## 2021-04-10 ENCOUNTER — Other Ambulatory Visit: Payer: Self-pay

## 2021-04-10 ENCOUNTER — Encounter: Payer: Self-pay | Admitting: Cardiovascular Disease

## 2021-04-10 VITALS — BP 142/80 | HR 76 | Ht 70.0 in | Wt 210.6 lb

## 2021-04-10 DIAGNOSIS — I309 Acute pericarditis, unspecified: Secondary | ICD-10-CM

## 2021-04-10 DIAGNOSIS — I480 Paroxysmal atrial fibrillation: Secondary | ICD-10-CM

## 2021-04-10 NOTE — Patient Instructions (Signed)
Medication Instructions:   *If you need a refill on your cardiac medications before your next appointment, please call your pharmacy*  Lab Work:  If you have labs (blood work) drawn today and your tests are completely normal, you will receive your results only by: MyChart Message (if you have MyChart) OR A paper copy in the mail If you have any lab test that is abnormal or we need to change your treatment, we will call you to review the results.   Follow-Up: At [facility], you and your health needs are our priority.  As part of our continuing mission to provide you with exceptional heart care, we have created designated Provider Care Teams.  These Care Teams include your primary Cardiologist (physician) and Advanced Practice Providers (APPs -  Physician Assistants and Nurse Practitioners) who all work together to provide you with the care you need, when you need it.  We recommend signing up for the patient portal called "MyChart".  Sign up information is provided on this After Visit Summary.  MyChart is used to connect with patients for Virtual Visits (Telemedicine).  Patients are able to view lab/test results, encounter notes, upcoming appointments, etc.  Non-urgent messages can be sent to your provider as well.   To learn more about what you can do with MyChart, go to [email or web address].    Your next appointment:   1 year(s)  The format for your next appointment:   In Person  Provider:   [name], MD

## 2021-05-24 ENCOUNTER — Other Ambulatory Visit: Payer: Self-pay | Admitting: Cardiovascular Disease

## 2021-09-21 ENCOUNTER — Other Ambulatory Visit: Payer: Self-pay | Admitting: Cardiovascular Disease

## 2021-09-21 DIAGNOSIS — I480 Paroxysmal atrial fibrillation: Secondary | ICD-10-CM

## 2021-09-21 NOTE — Telephone Encounter (Signed)
Prescription refill request for Xarelto received.  ?Indication: Afib  ?Last office visit: 04/10/21 (Nahser) ?Weight: 95.5kg ?Age: 71 ?Scr: 0.70 (08/08/21 via [facility]) ?CrCl: 111.93ml/min ? ?Appropriate dose and refill sent to requested pharmacy.  ?

## 2021-12-10 ENCOUNTER — Other Ambulatory Visit: Payer: Self-pay

## 2021-12-10 DIAGNOSIS — I480 Paroxysmal atrial fibrillation: Secondary | ICD-10-CM

## 2021-12-10 MED ORDER — RIVAROXABAN 20 MG PO TABS: ORAL_TABLET | ORAL | 1 refills | Status: DC

## 2021-12-10 MED ORDER — METOPROLOL TARTRATE 25 MG PO TABS: 25.0000 mg | ORAL_TABLET | Freq: Two times a day (BID) | ORAL | 0 refills | Status: DC

## 2021-12-10 MED ORDER — ROSUVASTATIN CALCIUM 5 MG PO TABS: 5.0000 mg | ORAL_TABLET | ORAL | 0 refills | Status: DC

## 2021-12-10 NOTE — Telephone Encounter (Signed)
Prescription refill request for Xarelto received.  Indication:Afib Last office visit:11/22 Weight:95.5 kg Age:71 Scr:0.7 CrCl:109.52 ml/min  Prescription refilled

## 2022-03-11 ENCOUNTER — Other Ambulatory Visit: Payer: Self-pay | Admitting: Cardiovascular Disease

## 2022-03-12 ENCOUNTER — Other Ambulatory Visit: Payer: Self-pay | Admitting: Cardiovascular Disease

## 2022-04-08 ENCOUNTER — Other Ambulatory Visit: Payer: Self-pay | Admitting: Cardiovascular Disease

## 2022-04-09 ENCOUNTER — Other Ambulatory Visit: Payer: Self-pay | Admitting: Cardiovascular Disease

## 2022-04-15 ENCOUNTER — Encounter: Payer: Self-pay | Admitting: Cardiovascular Disease

## 2022-04-15 ENCOUNTER — Ambulatory Visit: Payer: Medicare Other | Attending: Cardiovascular Disease | Admitting: Cardiovascular Disease

## 2022-04-15 VITALS — BP 128/78 | HR 72 | Ht 70.0 in | Wt 214.8 lb

## 2022-04-15 DIAGNOSIS — R0609 Other forms of dyspnea: Secondary | ICD-10-CM | POA: Insufficient documentation

## 2022-04-15 DIAGNOSIS — I480 Paroxysmal atrial fibrillation: Secondary | ICD-10-CM | POA: Insufficient documentation

## 2022-04-15 DIAGNOSIS — Z8679 Personal history of other diseases of the circulatory system: Secondary | ICD-10-CM | POA: Diagnosis not present

## 2022-04-15 MED ORDER — RIVAROXABAN 20 MG PO TABS: ORAL_TABLET | ORAL | 3 refills | Status: DC

## 2022-04-15 MED ORDER — METOPROLOL TARTRATE 25 MG PO TABS: 25.0000 mg | ORAL_TABLET | Freq: Two times a day (BID) | ORAL | 3 refills | Status: DC

## 2022-04-15 MED ORDER — ROSUVASTATIN CALCIUM 5 MG PO TABS: 5.0000 mg | ORAL_TABLET | ORAL | 3 refills | Status: DC

## 2022-04-15 NOTE — Progress Notes (Signed)
Sydney Wolfe Date of Birth  12/02/1950 [facility]     [place] Office  [address]    [address]   [address] [place], [place]  [address]    [place], [place]  [address] [phone]  Fax  [phone]  [phone]  Fax [phone]  Problems: 1. Hypertension 2. Hyperlipidemia 3. Mild obesity. 4.  [name] 5. Rheumatoid arthritis  6. Hypothyroidism   Sydney Wolfe is a 71 y.o. female with the above noted hx.  She has done very well. She has not had any episodes of chest pain or shortness breath. She has been exercising on regular basis.  Sydney Wolfe:  She is tolerating the coreg well but has noticed some photosensitivity ( listed by epocrates also as a side effect)  Sydney Wolfe:  [name] was admitted to the hosptial with CP several weeks ago.  She rule out of MI,  MRI of the heart was normal.  Stress myoview 04/13/13 was normal.   She had continued to have fatigue, variable BP.   She also has dyspnea.  Her 2 sisters have cardiac issues Sydney Wolfe)   Sydney Wolfe:  Sydney Wolfe is doing well.  Exercising reguarly.   She is tolerating the coreg.    December 15, 2014:  December 14, 2015:  Doing well from a cardiac standpoint She's been having more problems with her rheumatoid arthritis. She's now on Remicade infusions and takes naproxen on a regular basis. She's been getting exercise on a regular basis. She rides her stationary bike 4 times a week.  She has some shortness of breath and chest tightness with exertion -particularly when she climbs stairs. Like a weight on her chest ,  Pressure like sensation .  Resolves after she stops for about 30 seconds.    Oct. 24, 2017:  Was hospitalized in Sept. with pericarditis and atrial fib .   Was likely due to Rheumatoid arthritis .  She was originally hospitalized at [facility] in mid September with chest pain. Her cardiac enzymes were normal. Cardiac  cath revealed smooth and normal coronary arteries.  She  was discharged but then when she saw her medical doctor she was readmitted to [facility] and was found at pericarditis and atrial fibrillation.   She is slowly regaining her strength. She still has an unusual sensation in her chest. The pain is not nearly as bad as it was a month ago but she is still not quite back to her normal self.   Sydney Wolfe: Sydney Wolfe is seen today .  Has remained in NSR .  Has some chronic dyspnea,  Sees Dr. Sherene Sires at [facility] .   Aug. 17, Wolfe:    Doing well from a cardiac standpoint. Had back surgery in March ( C4,5,6)   - did well   September 15, 2017:  Pt was seen last year following an episode of pericarditis  associates with atrial fib ( was seen by Vilinda Boehringer Sept, 2017  at [place]).  Pericarditis was likely due to Rheumatoid arthritis .   Converted on flecainide .  Flecainide was stopped.  She had a cath during that admission - normal cors  No palpitations to suggest atrial fib .  She had a lightheaded episode while walking several weeks . She was out walking back from the dumpster.   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  Exercises also  Recent labs show that her total cholesterol  is 150.  The LDL is 67.  Triglyceride level is 178.  We discussed increasing her exercise and working harder on her diet and exercise and weight loss program.   March 12, 2019:  Sydney Wolfe is seen today for follow-up of her episode of pericarditis associated with atrial fibrillation.  This occurred in 2017.  She was seen by Vilinda Boehringer in the [facility].  The pericarditis seem to be related to the rheumatoid arthritis.  She converted on flecainide and the flecainide was later stopped.  She had a heart catheterization during that admission which revealed normal coronary arteries.  No palpitations,  Does some exercise.  Walks 2-2.5 miles a day  Works Systems developer ( Loss adjuster, chartered at [employer])  Continues to have back issues Has lost 15-20 lbs since last  visit ,     Oct. 27, 2021: Sydney Wolfe is seen today for follow up of her pericarditis with associated atrial fib.  The pericarditis was likely due to her RA.  She converted with Flecainide. Heart cath revealed normal cors.   She is noticed a little bit of incidental bruising on her arms.  We discussed possibly changing the Xarelto to Eliquis.  It sounds like for now she would like to stay with Xarelto.  Nov. 14, 2022: Sydney Wolfe is seen today for follow-up of her paroxysmal atrial fibrillation.  She had a history of pericarditis that was likely due to her rheumatoid arthritis.  She converted with flecainide.  Heart catheterization at that time revealed normal coronary arteries. No CP or dyspnea.  Lots of back issues.  Is not a surgical candidate Looking to possibly start pain clinic  / back injections   She is at low risk for her upcoming back injections. She Sydney hold her Xarelto for 3 days prior to her injections  Nov. 20, 2023   Sydney Wolfe is seen for follow up of her PAF .    Hx of pericarditis related to RA .   Persistent DOE .   Having lots of back issues .   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     celecoxib (CELEBREX) 100 MG capsule Take 1 capsule by mouth 2 (two) times daily.     Cholecalciferol (VITAMIN D) 2000 units tablet Take 2,000 Units by mouth daily.     dorzolamide-timolol (COSOPT) 22.3-6.8 MG/ML ophthalmic solution Place 1 drop into both eyes 2 (two) times daily.     folic acid (FOLVITE) 1 MG tablet Take 1 mg by mouth daily.     levothyroxine (SYNTHROID) 112 MCG tablet Take 112 mcg by mouth daily before breakfast.     metoprolol tartrate (LOPRESSOR) 25 MG tablet Take 1 tablet (25 mg total) by mouth 2 (two) times daily. Please call to schedule an overdue appointment with Dr. Elease Hashimoto for refills,  [phone], thank you. 2ND ATTEMPT 60 tablet 0   nitroGLYCERIN (NITROSTAT) 0.4 MG SL tablet Place 1 tablet (0.4 mg total) under the tongue every 5 (five) minutes as needed for chest pain. 25 tablet 3   predniSONE (DELTASONE) 5 MG tablet Take 5 mg by mouth as needed. Tapering dose     rivaroxaban (XARELTO) 20 MG TABS tablet TAKE 1 TABLET BY MOUTH EVERY DAY WITH SUPPER 90 tablet 1   rosuvastatin (CRESTOR) 5 MG tablet Take 1 tablet (5  mg total) by mouth every other day. Please keep upcoming appt. With Dr. Elease Hashimoto in Nov. To receive further refills. Thank you 60 tablet 0   sulfaSALAzine (AZULFIDINE) 500 MG tablet Take 1 tablet by mouth daily.     dicyclomine (BENTYL) 20 MG tablet Take 20 mg by mouth 4 (four) times daily. (Patient not taking: Reported on 04/10/2021)     No current facility-administered medications on file prior to visit.    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   Hot flashes    Hyperlipidemia    Hypertension    Hypothyroidism    Normal nuclear stress test 2004   OA (osteoarthritis)    Obesity    PAF (paroxysmal atrial fibrillation) ([facility]) 01/2016   Pericarditis    [name] (right bundle branch block)     Past Surgical History:  Procedure Laterality Date   ANTERIOR CERVICAL DECOMP/DISCECTOMY FUSION N/A 3/13/Wolfe   Procedure: Cervical four-Cervical seven  Anterior cervical discectomy with fusion and plate fixation;  Surgeon: Loura Halt Ditty, MD;  Location: [facility];  Service: Neurosurgery;  Laterality: N/A;   CARDIAC CATHETERIZATION N/A 02/09/2016   Procedure: Left Heart Cath and Coronary Angiography;  Surgeon:  Corky Crafts, MD;  Location: [facility] INVASIVE CV LAB;  Service: Cardiovascular;  Laterality: N/A;   CARDIOVASCULAR STRESS TEST  08/20/2002   EF 76%   CARPAL TUNNEL RELEASE     CHOLECYSTECTOMY  1987   DILATION AND CURETTAGE OF UTERUS  multiple   HAND SURGERY  1987 and 1988   bilateral   SPINE SURGERY  08/2008   T2   TUBAL LIGATION  1975   US ECHOCARDIOGRAPHY  07/25/2004   EF 55-60%    Social History   Tobacco Use  Smoking Status Never  Smokeless Tobacco Never    Social History   Substance and Sexual Activity  Alcohol Use No    Family History  Problem Relation Age of Onset   Heart failure Mother    Rheum arthritis Mother    Hypertension Father    Stroke Father    Diabetes Father    Rheum arthritis Father     Reviw of Systems:  Noted in current history.  Physical Exam: Blood pressure 128/78, pulse 72, height 5\' 10"  (1.778 m), weight 214 lb 12.8 oz (97.4 kg), SpO2 93 %.  GEN:  Well nourished, well developed in no acute distress HEENT: Normal NECK: No JVD; No carotid bruits LYMPHATICS: No lymphadenopathy CARDIAC: RRR  ,  no murmurs, no pericardial or pleural rub RESPIRATORY:  Clear to auscultation without rales, wheezing or rhonchi  ABDOMEN: Soft, non-tender, non-distended MUSCULOSKELETAL:  No edema; No deformity  SKIN: Warm and dry NEUROLOGIC:  Alert and oriented x 3    ECG: April 15, 2022: Normal sinus rhythm at 71.  Right bundle branch block     Assessment / Plan:   1.   PAF -she remains in normal sinus rhythm.  She is on Xarelto for anticoagulation.  She needs to have back  injections for her severe arthritis.  She is at low risk for her upcoming back injections.  The clearance for the anticoagulant will be made by the pharmacist in Coumadin clinic but I suspect that they will allow her to hold her Xarelto for 3 days.  Lab return in 1 year to see me or an APP   2. Actue pericarditis .  No further signs or symptoms of pericarditis. She had some  concern that she Sydney have some pleural involvement or pleural scarring.  I do not hear any pleural or pericardial rub.  She will continue to follow-up with her rheumatologist.    3. Hyperlipidemia: Managed by her primary medical doctor.   [name], MD  04/15/2022 3:08 PM    [facility] [address] [place],  [address] [place], [place]  [address] Pager [phone] Phone: [phone]; Fax: [phone]

## 2022-04-15 NOTE — Patient Instructions (Signed)
Medication Instructions:  Your physician recommends that you continue on your current medications as directed. Please refer to the Current Medication list given to you today.  *If you need a refill on your cardiac medications before your next appointment, please call your pharmacy*   Lab Work: NONE If you have labs (blood work) drawn today and your tests are completely normal, you will receive your results only by: MyChart Message (if you have MyChart) OR A paper copy in the mail If you have any lab test that is abnormal or we need to change your treatment, we will call you to review the results.   Testing/Procedures: ECHO Your physician has requested that you have an echocardiogram. Echocardiography is a painless test that uses sound waves to create images of your heart. It provides your doctor with information about the size and shape of your heart and how well your heart's chambers and valves are working. This procedure takes approximately one hour. There are no restrictions for this procedure. Please do NOT wear cologne, perfume, aftershave, or lotions (deodorant is allowed). Please arrive 15 minutes prior to your appointment time.  Follow-Up: At [facility], you and your health needs are our priority.  As part of our continuing mission to provide you with exceptional heart care, we have created designated Provider Care Teams.  These Care Teams include your primary Cardiologist (physician) and Advanced Practice Providers (APPs -  Physician Assistants and Nurse Practitioners) who all work together to provide you with the care you need, when you need it.  We recommend signing up for the patient portal called "MyChart".  Sign up information is provided on this After Visit Summary.  MyChart is used to connect with patients for Virtual Visits (Telemedicine).  Patients are able to view lab/test results, encounter notes, upcoming appointments, etc.  Non-urgent messages can be sent to  your provider as well.   To learn more about what you can do with MyChart, go to [email or web address].    Your next appointment:   1 year(s)  The format for your next appointment:   In Person  Provider:   [name], MD       Important Information About Sugar

## 2022-05-06 ENCOUNTER — Ambulatory Visit (HOSPITAL_COMMUNITY): Payer: Medicare Other | Attending: Cardiovascular Disease

## 2022-05-06 DIAGNOSIS — I340 Nonrheumatic mitral (valve) insufficiency: Secondary | ICD-10-CM | POA: Insufficient documentation

## 2022-05-06 DIAGNOSIS — I4510 Unspecified right bundle-branch block: Secondary | ICD-10-CM | POA: Diagnosis not present

## 2022-05-06 DIAGNOSIS — I10 Essential (primary) hypertension: Secondary | ICD-10-CM | POA: Insufficient documentation

## 2022-05-06 DIAGNOSIS — E785 Hyperlipidemia, unspecified: Secondary | ICD-10-CM | POA: Diagnosis not present

## 2022-05-06 DIAGNOSIS — Z8679 Personal history of other diseases of the circulatory system: Secondary | ICD-10-CM | POA: Diagnosis not present

## 2022-05-06 DIAGNOSIS — I480 Paroxysmal atrial fibrillation: Secondary | ICD-10-CM | POA: Insufficient documentation

## 2022-05-06 DIAGNOSIS — R0609 Other forms of dyspnea: Secondary | ICD-10-CM | POA: Insufficient documentation

## 2022-05-06 LAB — ECHOCARDIOGRAM COMPLETE
Area-P 1/2: 3.51 cm2
MV M vel: 5.27 m/s
MV Peak grad: 111.1 mmHg
P 1/2 time: 611 msec
S' Lateral: 2 cm

## 2022-05-27 ENCOUNTER — Other Ambulatory Visit: Payer: Self-pay | Admitting: Cardiovascular Disease

## 2022-05-27 DIAGNOSIS — I480 Paroxysmal atrial fibrillation: Secondary | ICD-10-CM

## 2022-05-27 DIAGNOSIS — R0609 Other forms of dyspnea: Secondary | ICD-10-CM

## 2022-05-27 DIAGNOSIS — Z8679 Personal history of other diseases of the circulatory system: Secondary | ICD-10-CM

## 2022-06-13 ENCOUNTER — Ambulatory Visit: Payer: Medicare Other | Admitting: Cardiovascular Disease

## 2022-06-25 LAB — COLOGUARD: COLOGUARD: NEGATIVE

## 2023-04-22 ENCOUNTER — Ambulatory Visit: Payer: Medicare Other | Attending: Cardiovascular Disease | Admitting: Cardiovascular Disease

## 2023-04-22 ENCOUNTER — Encounter: Payer: Self-pay | Admitting: Cardiovascular Disease

## 2023-04-22 VITALS — BP 135/82 | HR 67 | Ht 70.0 in | Wt 218.4 lb

## 2023-04-22 DIAGNOSIS — I480 Paroxysmal atrial fibrillation: Secondary | ICD-10-CM | POA: Diagnosis present

## 2023-04-22 DIAGNOSIS — R0609 Other forms of dyspnea: Secondary | ICD-10-CM | POA: Diagnosis present

## 2023-04-22 DIAGNOSIS — Z8679 Personal history of other diseases of the circulatory system: Secondary | ICD-10-CM | POA: Insufficient documentation

## 2023-04-22 MED ORDER — RIVAROXABAN 20 MG PO TABS: ORAL_TABLET | ORAL | 3 refills | Status: DC

## 2023-04-22 MED ORDER — ROSUVASTATIN CALCIUM 5 MG PO TABS: 5.0000 mg | ORAL_TABLET | ORAL | 3 refills | Status: DC

## 2023-04-22 NOTE — Patient Instructions (Signed)
Follow-Up: At [facility], you and your health needs are our priority.  As part of our continuing mission to provide you with exceptional heart care, we have created designated Provider Care Teams.  These Care Teams include your primary Cardiologist (physician) and Advanced Practice Providers (APPs -  Physician Assistants and Nurse Practitioners) who all work together to provide you with the care you need, when you need it.  Your next appointment:   1 year(s)  Provider:   Armanda Magic, MD

## 2023-04-22 NOTE — Progress Notes (Signed)
Sydney Wolfe Date of Birth  [birth year] [facility]      Office  [address]    [address]   [address] [place], [place]  [address]    [place], [place]  [address] [phone]  Fax  [phone]  [phone]  Fax [phone]  Problems: 1. Hypertension 2. Hyperlipidemia 3. Mild obesity. 4.  [name] 5. Rheumatoid arthritis  6. Hypothyroidism   Sydney Wolfe is a 72 y.o. female with the above noted hx.  She has done very well. She has not had any episodes of chest pain or shortness breath. She has been exercising on regular basis.  November 17, 2012:  She is tolerating the coreg well but has noticed some photosensitivity ( listed by epocrates also as a side effect)  Nov. 20, 2014:  Sydney Wolfe was admitted to the hosptial with CP several weeks ago.  She rule out of MI,  MRI of the heart was normal.  Stress myoview 04/13/13 was normal.   She had continued to have fatigue, variable BP.   She also has dyspnea.  Her 2 sisters have cardiac issues [name] Reavis)   Oct 04, 2013:  Sydney Wolfe is doing well.  Exercising reguarly.   She is tolerating the coreg.    December 15, 2014:  December 14, 2015:  Doing well from a cardiac standpoint She's been having more problems with her rheumatoid arthritis. She's now on Remicade infusions and takes naproxen on a regular basis. She's been getting exercise on a regular basis. She rides her stationary bike 4 times a week.  She has some shortness of breath and chest tightness with exertion -particularly when she climbs stairs. Like a weight on her chest ,  Pressure like sensation .  Resolves after she stops for about 30 seconds.    Oct. 24, 2017:  Was hospitalized in Sept. with pericarditis and atrial fib .   Was likely due to Rheumatoid arthritis .  She was originally hospitalized at [facility] in mid September with chest pain. Her cardiac enzymes were normal. Cardiac  cath revealed smooth and normal coronary arteries.  She  was discharged but then when she saw her medical doctor she was readmitted to [facility] and was found at pericarditis and atrial fibrillation.   She is slowly regaining her strength. She still has an unusual sensation in her chest. The pain is not nearly as bad as it was a month ago but she is still not quite back to her normal self.   Jan. 24, 2018: Sydney Wolfe is seen today .  Has remained in NSR .  Has some chronic dyspnea,  Sees Dr. Sherene Sires at [facility] .   Aug. 17, 2018:    Doing well from a cardiac standpoint. Had back surgery in March ( C4,5,6)   - did well   September 15, 2017:  Pt was seen last year following an episode of pericarditis  associates with atrial fib ( was seen by Sydney Wolfe Sept, 2017  at [place]).  Pericarditis was likely due to Rheumatoid arthritis .   Converted on flecainide .  Flecainide was stopped.  She had a cath during that admission - normal cors  No palpitations to suggest atrial fib .  She had a lightheaded episode while walking several weeks . She was out walking back from the dumpster.   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  Exercises also  Recent labs show that her total cholesterol  is 150.  The LDL is 67.  Triglyceride level is 178.  We discussed increasing her exercise and working harder on her diet and exercise and weight loss program.   March 12, 2019:  Sydney Wolfe is seen today for follow-up of her episode of pericarditis associated with atrial fibrillation.  This occurred in 2017.  She was seen by Sydney Wolfe in the [facility].  The pericarditis seem to be related to the rheumatoid arthritis.  She converted on flecainide and the flecainide was later stopped.  She had a heart catheterization during that admission which revealed normal coronary arteries.  No palpitations,  Does some exercise.  Walks 2-2.5 miles a day  Works Systems developer ( Loss adjuster, chartered at [employer])  Continues to have back issues Has lost 15-20 lbs since last  visit ,     Oct. 27, 2021: Sydney Wolfe is seen today for follow up of her pericarditis with associated atrial fib.  The pericarditis was likely due to her RA.  She converted with Flecainide. Heart cath revealed normal cors.   She is noticed a little bit of incidental bruising on her arms.  We discussed possibly changing the Xarelto to Eliquis.  It sounds like for now she would like to stay with Xarelto.  Nov. 14, 2022: Sydney Wolfe is seen today for follow-up of her paroxysmal atrial fibrillation.  She had a history of pericarditis that was likely due to her rheumatoid arthritis.  She converted with flecainide.  Heart catheterization at that time revealed normal coronary arteries. No CP or dyspnea.  Lots of back issues.  Is not a surgical candidate Looking to possibly start pain clinic  / back injections   She is at low risk for her upcoming back injections. She may hold her Xarelto for 3 days prior to her injections  Nov. 20, 2023   Sydney Wolfe is seen for follow up of her PAF .    Hx of pericarditis related to RA .   Persistent DOE .   Having lots of back issues .   Her last echo showed mild dilatation of her ascending aorta    Nov. 26, 2024 Sydney Wolfe is seen for follow up for her PAF, pericarditis No CP , no pleuretic CP  No dyspnea  Exercising regularly  Still works as a Loss adjuster, chartered for a church   Lipids are followed by Dr. Cyndia Wolfe / Sydney Wolfe   Is active , no symptoms     Current Outpatient Medications on File Prior to Visit  Medication Sig Dispense Refill   Abatacept (ORENCIA IV) Inject 1 Dose into the vein every 30 (thirty) days.     Cholecalciferol (VITAMIN D) 2000 units tablet Take 2,000 Units by mouth daily.     dicyclomine (BENTYL) 20 MG tablet Take 20 mg by mouth 4 (four) times daily.     dorzolamide-timolol (COSOPT) 22.3-6.8 MG/ML ophthalmic solution Place 1 drop into both eyes 2 (two) times daily.     latanoprost (XALATAN) 0.005 % ophthalmic solution Place 1 drop into the left eye at  bedtime.     levothyroxine (SYNTHROID) 112 MCG tablet Take 112 mcg by mouth daily before breakfast.     meloxicam (MOBIC) 15 MG tablet Take 15 mg by mouth at bedtime.     metoprolol tartrate (LOPRESSOR) 25 MG tablet TAKE 1 TABLET(25 MG) BY MOUTH TWICE DAILY 180 tablet 3   nitroGLYCERIN (NITROSTAT) 0.4 MG SL tablet Place 1 tablet (0.4 mg total) under the tongue every 5 (five) minutes as needed for  chest pain. 25 tablet 3   predniSONE (DELTASONE) 5 MG tablet Take 5 mg by mouth as needed. Tapering dose     sulfaSALAzine (AZULFIDINE) 500 MG tablet Take 1 tablet by mouth daily.     No current facility-administered medications on file prior to visit.    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   Hot flashes    Hyperlipidemia    Hypertension    Hypothyroidism    Normal nuclear stress test 2004   OA (osteoarthritis)    Obesity    PAF (paroxysmal atrial fibrillation) (HCC) 01/2016   Pericarditis    [name] (right bundle branch block)     Past Surgical History:  Procedure Laterality Date   ANTERIOR CERVICAL DECOMP/DISCECTOMY FUSION N/A 08/06/2016   Procedure: Cervical four-Cervical seven  Anterior cervical discectomy with fusion and plate fixation;  Surgeon: Loura Halt Ditty, MD;  Location: [facility] OR;  Service: Neurosurgery;  Laterality: N/A;   CARDIAC CATHETERIZATION N/A 02/09/2016   Procedure: Left Heart Cath and Coronary Angiography;  Surgeon: Corky Crafts, MD;  Location: [facility] INVASIVE CV LAB;  Service: Cardiovascular;  Laterality: N/A;   CARDIOVASCULAR STRESS TEST  08/20/2002   EF 76%   CARPAL TUNNEL RELEASE     CHOLECYSTECTOMY   1987   DILATION AND CURETTAGE OF UTERUS  multiple   HAND SURGERY  1987 and 1988   bilateral   SPINE SURGERY  08/2008   T2   TUBAL LIGATION  1975   US ECHOCARDIOGRAPHY  07/25/2004   EF 55-60%    Social History   Tobacco Use  Smoking Status Never  Smokeless Tobacco Never    Social History   Substance and Sexual Activity  Alcohol Use No    Family History  Problem Relation Age of Onset   Heart failure Mother    Rheum arthritis Mother    Hypertension Father    Stroke Father    Diabetes Father    Rheum arthritis Father     Reviw of Systems:  Noted in current history.  Physical Exam: Blood pressure 135/82, pulse 67, height 5\' 10"  (1.778 m), weight 218 lb 6.4 oz (99.1 kg), SpO2 95%.  GEN:  Well nourished, well developed in no acute distress HEENT: Normal NECK: No JVD; No carotid bruits LYMPHATICS: No lymphadenopathy CARDIAC: RRR , no murmurs, rubs, gallops RESPIRATORY:  Clear to auscultation without rales, wheezing or rhonchi  ABDOMEN: Soft, non-tender, non-distended MUSCULOSKELETAL:  No edema; No deformity  SKIN: Warm and dry NEUROLOGIC:  Alert and oriented x 3    ECG:    EKG Interpretation Date/Time:  Tuesday April 22 2023 11:27:19 EST Ventricular Rate:  67 PR Interval:  178 QRS Duration:  82 QT Interval:  408 QTC Calculation: 431 R Axis:   -1  Text Interpretation: Normal sinus rhythm Normal ECG When compared with ECG of 11-Feb-2016 06:13, ST no longer elevated in Inferior leads ST no longer elevated in Anterolateral leads Nonspecific T wave abnormality has replaced inverted T waves in Inferior  leads Confirmed by [name] ([id number]) on 04/22/2023 11:51:23 AM       Assessment / Plan:   1.   PAF -   no recurrent atrial fib.  Continue Xarelto   2. Actue pericarditis .  - likely secondary to an RA flare .   No further episodes of AFib with better control of her RA   3. Hyperlipidemia:   managed by Sydney Wolfe primary care     [name], MD   04/22/2023 11:53 AM    [facility] [address] [place],  [address] [place], [place]  [address] Pager [phone] Phone: [phone]; Fax: [phone]

## 2023-08-25 ENCOUNTER — Other Ambulatory Visit: Payer: Self-pay | Admitting: Cardiovascular Disease

## 2023-08-25 DIAGNOSIS — Z8679 Personal history of other diseases of the circulatory system: Secondary | ICD-10-CM

## 2023-08-25 DIAGNOSIS — R0609 Other forms of dyspnea: Secondary | ICD-10-CM

## 2023-08-25 DIAGNOSIS — I480 Paroxysmal atrial fibrillation: Secondary | ICD-10-CM

## 2024-02-20 ENCOUNTER — Other Ambulatory Visit: Payer: Self-pay

## 2024-02-20 ENCOUNTER — Other Ambulatory Visit: Payer: Self-pay | Admitting: Physician Assistant

## 2024-02-20 DIAGNOSIS — I480 Paroxysmal atrial fibrillation: Secondary | ICD-10-CM

## 2024-02-20 DIAGNOSIS — R0609 Other forms of dyspnea: Secondary | ICD-10-CM

## 2024-02-20 DIAGNOSIS — Z8679 Personal history of other diseases of the circulatory system: Secondary | ICD-10-CM

## 2024-02-20 MED ORDER — METOPROLOL TARTRATE 25 MG PO TABS: 25.0000 mg | ORAL_TABLET | Freq: Two times a day (BID) | ORAL | 0 refills | Status: DC

## 2024-02-25 ENCOUNTER — Telehealth: Payer: Self-pay | Admitting: Cardiology

## 2024-02-25 NOTE — Telephone Encounter (Signed)
 Spoke with Pt. Advised any lab work needed would be ordered at her appointment and we had a lab on the first floor of our building. Pt stated thanks.

## 2024-02-25 NOTE — Telephone Encounter (Signed)
 Pt wondering if she will need lab work before her annual f/u scheduled 04/19/24. Please advise.

## 2024-04-18 NOTE — Progress Notes (Unsigned)
 Cardiology Office Note   Date:  04/19/2024  ID:  Sydney Wolfe, DOB 26-Mar-1951, MRN [id number] PCP: Sophronia Ozell BROCKS, MD  [facility] Providers Cardiologist:  Aleene Passe, MD (Inactive)   History of Present Illness Sydney Wolfe is a 73 y.o. female with a past medical history of hypertension, hyperlipidemia, mild obesity, [name], rheumatoid arthritis, and hypothyroidism here for follow-up appointment.  She has been followed by Dr. Passe for the last 11 years starting in 2014.  She was having persistent DOE in November 2023.  Also having a lot of back issues.  She had an echocardiogram that showed mild dilation of her ascending aorta.  She was then seen in follow-up November 2024 with no chest pain and no pleuritic pain.  No dyspnea.  Exercising regularly.  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  Lipids were followed by her primary care ([facility]).  She remained active and was asymptomatic.  Today, she presents with a hx of pericarditis and heart murmur for cardiovascular follow-up.  She has no chest pain or shortness of breath. She had atrial fibrillation during pericarditis in the past and has had no recurrence. She uses a smartwatch to monitor her heart rhythm.  She recalls being told she has a heart murmur and had an echocardiogram in 2023.  She takes metoprolol  25 mg twice daily, Xarelto , and Crestor . She notes a tendency for high blood pressure with recent elevated readings, possibly related to decongestant use, and past diastolic pressures in the 90s.  Her thyroid treatment was changed from levothyroxine  to Armour, and she feels better with more energy. Recent TSH was 2.2.  She receives monthly infusions for rheumatoid arthritis that improve her mobility. She works three days a week at [employer] and stays active  Reports no shortness of breath nor dyspnea on exertion. Reports no chest pain, pressure, or tightness. No edema, orthopnea, PND. Reports no  palpitations.   Discussed the use of AI scribe software for clinical note transcription with the patient, who gave verbal consent to proceed.   ROS: Pertinent ROS in HPI  Studies Reviewed EKG Interpretation Date/Time:  Monday April 19 2024 15:22:16 EST Ventricular Rate:  86 PR Interval:  168 QRS Duration:  116 QT Interval:  398 QTC Calculation: 476 R Axis:   6  Text Interpretation: Normal sinus rhythm Incomplete right bundle branch block T wave abnormality, consider inferior ischemia When compared with ECG of 22-Apr-2023 11:27, Incomplete right bundle branch block is now Present Confirmed by Lucien Blanc [phone]) on 04/19/2024 4:24:51 PM     2023 IMPRESSIONS     1. Left ventricular ejection fraction, by estimation, is 60 to 65%. The  left ventricle has normal function. The left ventricle has no regional  wall motion abnormalities. There is mild left ventricular hypertrophy.  Left ventricular diastolic parameters  are consistent with Grade I diastolic dysfunction (impaired relaxation).   2. Right ventricular systolic function is normal. The right ventricular  size is normal.   3. Left atrial size was mildly dilated.   4. The mitral valve is abnormal. Mild mitral valve regurgitation. No  evidence of mitral stenosis.   5. The aortic valve is tricuspid. There is mild calcification of the  aortic valve. There is mild thickening of the aortic valve. Aortic valve  regurgitation is mild to moderate. Aortic valve sclerosis is present, with  no evidence of aortic valve  stenosis.   6. Aortic dilatation noted. There is mild dilatation of the ascending  aorta, measuring 40 mm.   7. The inferior vena cava is normal in size with greater than 50%  respiratory variability, suggesting right atrial pressure of 3 mmHg.   FINDINGS   Left Ventricle: Left ventricular ejection fraction, by estimation, is 60  to 65%. The left ventricle has normal function. The left ventricle has no  regional  wall motion abnormalities. The left ventricular internal cavity  size was normal in size. There is   mild left ventricular hypertrophy. Left ventricular diastolic parameters  are consistent with Grade I diastolic dysfunction (impaired relaxation).   Right Ventricle: The right ventricular size is normal. No increase in  right ventricular wall thickness. Right ventricular systolic function is  normal.   Left Atrium: Left atrial size was mildly dilated.   Right Atrium: Right atrial size was normal in size.   Pericardium: There is no evidence of pericardial effusion.   Mitral Valve: The mitral valve is abnormal. There is mild thickening of  the mitral valve leaflet(s). There is mild calcification of the mitral  valve leaflet(s). Mild mitral valve regurgitation. No evidence of mitral  valve stenosis.   Tricuspid Valve: The tricuspid valve is normal in structure. Tricuspid  valve regurgitation is not demonstrated. No evidence of tricuspid  stenosis.   Aortic Valve: The aortic valve is tricuspid. There is mild calcification  of the aortic valve. There is mild thickening of the aortic valve. Aortic  valve regurgitation is mild to moderate. Aortic regurgitation PHT measures  611 msec. Aortic valve sclerosis   is present, with no evidence of aortic valve stenosis.   Pulmonic Valve: The pulmonic valve was normal in structure. Pulmonic valve  regurgitation is not visualized. No evidence of pulmonic stenosis.   Aorta: Aortic dilatation noted. There is mild dilatation of the ascending  aorta, measuring 40 mm.   Venous: The inferior vena cava is normal in size with greater than 50%  respiratory variability, suggesting right atrial pressure of 3 mmHg.   IAS/Shunts: No atrial level shunt detected by color flow Doppler.   Risk Assessment/Calculations  CHA2DS2-VASc Score = 3  This indicates a 3.2% annual risk of stroke. The patient's score is based upon: CHF History: 0 HTN History:  1 Diabetes History: 0 Stroke History: 0 Vascular Disease History: 0 Age Score: 1 Gender Score: 1       Physical Exam VS:  BP (!) 177/95 Comment: 164/96 left side 2nd attempt  Pulse 92   Ht 5' 10 (1.778 m)   Wt 217 lb (98.4 kg)   SpO2 100%   BMI 31.14 kg/m        Wt Readings from Last 3 Encounters:  04/19/24 217 lb (98.4 kg)  04/22/23 218 lb 6.4 oz (99.1 kg)  04/15/22 214 lb 12.8 oz (97.4 kg)    GEN: Well nourished, well developed in no acute distress NECK: No JVD; No carotid bruits CARDIAC: RRR, no murmurs, rubs, gallops RESPIRATORY:  Clear to auscultation without rales, wheezing or rhonchi  ABDOMEN: Soft, non-tender, non-distended EXTREMITIES:  No edema; No deformity   ASSESSMENT AND PLAN  Hypertension Elevated blood pressure likely due to decongestant use. Baseline diastolic pressure often in the 90s. Current reading 164/96. - Monitor blood pressure at home for one week after resolving upper respiratory symptoms and discontinuing decongestants. - Consider increasing antihypertensive medication if blood pressure remains 160-170. -continue to monitor at home while off URI medications and let us  know if staying > - Switched to metoprolol  succinate 50 mg daily. -  Continue Xarelto  20 mg once daily with supper.  Mitral and aortic valve regurgitation with aortic valve sclerosis Mild mitral valve regurgitation and mild to moderate aortic valve regurgitation with sclerosis. Strong heart pump function on last echocardiogram in 2023. - Monitor with echocardiogram every 3 years.  Atrial fibrillation, resolved/chronic [name] Atrial fibrillation resolved since pericarditis. No recent episodes. Uses smartwatch for EKG monitoring. - Continue monitoring for atrial fibrillation symptoms using smartwatch EKG. -chronic [name] stable on EKG  Hypothyroidism, improved control Improved with switch to Armour thyroid. TSH improved from 9.7 to 2.2. Reports increased energy and  well-being. - Continue current thyroid management with Armour thyroid.  Lower extremity edema Chronic mild lower extremity edema. Uses compression stockings. - Continue use of compression stockings as needed.   Dispo: She can see Dr. Shlomo in a year  Signed, Orren LOISE Fabry, PA-C

## 2024-04-19 ENCOUNTER — Ambulatory Visit: Attending: Physician Assistant | Admitting: Physician Assistant

## 2024-04-19 ENCOUNTER — Encounter: Payer: Self-pay | Admitting: Physician Assistant

## 2024-04-19 VITALS — BP 177/95 | HR 92 | Ht 70.0 in | Wt 217.0 lb

## 2024-04-19 DIAGNOSIS — I480 Paroxysmal atrial fibrillation: Secondary | ICD-10-CM | POA: Diagnosis not present

## 2024-04-19 DIAGNOSIS — I309 Acute pericarditis, unspecified: Secondary | ICD-10-CM | POA: Insufficient documentation

## 2024-04-19 DIAGNOSIS — I4510 Unspecified right bundle-branch block: Secondary | ICD-10-CM | POA: Insufficient documentation

## 2024-04-19 DIAGNOSIS — R011 Cardiac murmur, unspecified: Secondary | ICD-10-CM | POA: Diagnosis not present

## 2024-04-19 DIAGNOSIS — R0609 Other forms of dyspnea: Secondary | ICD-10-CM | POA: Diagnosis not present

## 2024-04-19 DIAGNOSIS — Z8679 Personal history of other diseases of the circulatory system: Secondary | ICD-10-CM | POA: Insufficient documentation

## 2024-04-19 MED ORDER — METOPROLOL SUCCINATE ER 50 MG PO TB24: 50.0000 mg | ORAL_TABLET | Freq: Every day | ORAL | 2 refills | Status: AC

## 2024-04-19 NOTE — Patient Instructions (Addendum)
 Medication Instructions:  STOP Metoprolol  Tartrate START Metoprolol  Succinate (Toprol  XL) 50mg  Take 1 tablet once a day  *If you need a refill on your cardiac medications before your next appointment, please call your pharmacy*  Lab Work: None ordered If you have labs (blood work) drawn today and your tests are completely normal, you will receive your results only by: MyChart Message (if you have MyChart) OR A paper copy in the mail If you have any lab test that is abnormal or we need to change your treatment, we will call you to review the results.  Testing/Procedures: None ordered  Follow-Up: At [facility], you and your health needs are our priority.  As part of our continuing mission to provide you with exceptional heart care, our providers are all part of one team.  This team includes your primary Cardiologist (physician) and Advanced Practice Providers or APPs (Physician Assistants and Nurse Practitioners) who all work together to provide you with the care you need, when you need it.  Your next appointment:   12 month(s)  Provider:   Wilbert Bihari, MD  We recommend signing up for the patient portal called MyChart.  Sign up information is provided on this After Visit Summary.  MyChart is used to connect with patients for Virtual Visits (Telemedicine).  Patients are able to view lab/test results, encounter notes, upcoming appointments, etc.  Non-urgent messages can be sent to your provider as well.   To learn more about what you can do with MyChart, go to [email or web address].   Other Instructions Please check your blood pressure daily for two weeks, then contact the office with your readings  Please contact the office with your readings either by phone, by dropping it off in person, or by sending it through MyChart.   Be sure to check your blood pressure one to two hours after taking your medications.  Avoid the following for 30 minutes before checking your blood  pressure: No caffeine No alcohol No eating No smoking  No exercise  Five minutes before checking your blood pressure: Use the restroom Sit up straight in a chair with your back supported and feet flat on the floor Remain quiet and do not talk

## 2024-04-20 ENCOUNTER — Telehealth: Payer: Self-pay | Admitting: Physician Assistant

## 2024-04-20 DIAGNOSIS — Z8679 Personal history of other diseases of the circulatory system: Secondary | ICD-10-CM

## 2024-04-20 DIAGNOSIS — R0609 Other forms of dyspnea: Secondary | ICD-10-CM

## 2024-04-20 DIAGNOSIS — I480 Paroxysmal atrial fibrillation: Secondary | ICD-10-CM

## 2024-04-20 MED ORDER — RIVAROXABAN 20 MG PO TABS: ORAL_TABLET | ORAL | 1 refills | Status: AC

## 2024-04-20 MED ORDER — ROSUVASTATIN CALCIUM 5 MG PO TABS: 5.0000 mg | ORAL_TABLET | ORAL | 3 refills | Status: AC

## 2024-04-20 NOTE — Telephone Encounter (Signed)
*  STAT* If patient is at the pharmacy, call can be transferred to refill team.   1. Which medications need to be refilled? (please list name of each medication and dose if known) rivaroxaban  (XARELTO ) 20 MG TABS tablet  TAKE 1 TABLET BY MOUTH EVERY DAY WITH SUPPER   rosuvastatin  (CRESTOR ) 5 MG tablet  Take 1 tablet (5 mg total) by mouth every other day.  2. Would you like to learn more about the convenience, safety, & potential cost savings by using the [facility]? No   3. Are you open to using the [facility] No   4. Which pharmacy/location (including street and city if local pharmacy) is medication to be sent to? [id number] - [place], [place] - [address] AT [address] OF [address]     5. Do they need a 30 day or 90 day supply? 90 Day Supply

## 2024-04-20 NOTE — Telephone Encounter (Signed)
 Pt saw Orren Fabry, Sydney Wolfe on 04/19/24, last labs 10/08/23 Creat 0.79 at [facility] per [facility], age 73, weight 98.4kg, CrCl 97.05, based on CrCl pt is on appropriate dosage of Xarelto  20mg  every day for afib.  Will refill rx.

## 2024-04-20 NOTE — Telephone Encounter (Signed)
 Refill Rosuvastatin  has been sent.
